# Patient Record
Sex: Female | Born: 1955 | Race: White | Hispanic: No | Marital: Married | Smoking: Never smoker
Health system: Southern US, Community
[De-identification: ages and names within clinical notes are randomized; demographics above are authoritative.]

## PROBLEM LIST (undated history)

## (undated) DIAGNOSIS — F419 Anxiety disorder, unspecified: Secondary | ICD-10-CM

## (undated) DIAGNOSIS — I2699 Other pulmonary embolism without acute cor pulmonale: Secondary | ICD-10-CM

## (undated) DIAGNOSIS — F430 Acute stress reaction: Secondary | ICD-10-CM

## (undated) DIAGNOSIS — E041 Nontoxic single thyroid nodule: Secondary | ICD-10-CM

## (undated) DIAGNOSIS — I951 Orthostatic hypotension: Secondary | ICD-10-CM

## (undated) DIAGNOSIS — R918 Other nonspecific abnormal finding of lung field: Secondary | ICD-10-CM

## (undated) DIAGNOSIS — I10 Essential (primary) hypertension: Secondary | ICD-10-CM

## (undated) DIAGNOSIS — Z87440 Personal history of urinary (tract) infections: Secondary | ICD-10-CM

## (undated) DIAGNOSIS — F32A Depression, unspecified: Secondary | ICD-10-CM

## (undated) DIAGNOSIS — R002 Palpitations: Secondary | ICD-10-CM

## (undated) DIAGNOSIS — R51 Headache: Secondary | ICD-10-CM

## (undated) DIAGNOSIS — I495 Sick sinus syndrome: Secondary | ICD-10-CM

## (undated) DIAGNOSIS — M1990 Unspecified osteoarthritis, unspecified site: Secondary | ICD-10-CM

## (undated) DIAGNOSIS — K224 Dyskinesia of esophagus: Secondary | ICD-10-CM

## (undated) DIAGNOSIS — Z950 Presence of cardiac pacemaker: Secondary | ICD-10-CM

## (undated) DIAGNOSIS — G4730 Sleep apnea, unspecified: Secondary | ICD-10-CM

## (undated) DIAGNOSIS — M797 Fibromyalgia: Secondary | ICD-10-CM

## (undated) DIAGNOSIS — Z8679 Personal history of other diseases of the circulatory system: Secondary | ICD-10-CM

## (undated) DIAGNOSIS — F329 Major depressive disorder, single episode, unspecified: Secondary | ICD-10-CM

## (undated) DIAGNOSIS — E669 Obesity, unspecified: Secondary | ICD-10-CM

## (undated) HISTORY — DX: Acute stress reaction: F43.0

## (undated) HISTORY — DX: Depression, unspecified: F32.A

## (undated) HISTORY — DX: Other pulmonary embolism without acute cor pulmonale: I26.99

## (undated) HISTORY — PX: SPLENECTOMY, TOTAL: SHX788

## (undated) HISTORY — DX: Essential (primary) hypertension: I10

## (undated) HISTORY — PX: GASTRIC BYPASS: SHX52

## (undated) HISTORY — PX: RIB RESECTION: SHX5077

## (undated) HISTORY — DX: Personal history of urinary (tract) infections: Z87.440

## (undated) HISTORY — PX: ABDOMINAL HYSTERECTOMY: SHX81

## (undated) HISTORY — DX: Other nonspecific abnormal finding of lung field: R91.8

## (undated) HISTORY — PX: LAMINECTOMY: SHX219

## (undated) HISTORY — DX: Sick sinus syndrome: I49.5

## (undated) HISTORY — PX: ABDOMINAL SURGERY: SHX537

## (undated) HISTORY — DX: Palpitations: R00.2

## (undated) HISTORY — PX: CHOLECYSTECTOMY: SHX55

## (undated) HISTORY — DX: Obesity, unspecified: E66.9

## (undated) HISTORY — PX: FOOT SURGERY: SHX648

## (undated) HISTORY — PX: ECTOPIC PREGNANCY SURGERY: SHX613

## (undated) HISTORY — DX: Anxiety disorder, unspecified: F41.9

## (undated) HISTORY — PX: APPENDECTOMY: SHX54

## (undated) HISTORY — PX: PACEMAKER PLACEMENT: SHX43

## (undated) HISTORY — DX: Major depressive disorder, single episode, unspecified: F32.9

## (undated) HISTORY — PX: STOMACH SURGERY: SHX791

## (undated) HISTORY — DX: Orthostatic hypotension: I95.1

## (undated) HISTORY — PX: KNEE SURGERY: SHX244

## (undated) HISTORY — DX: Personal history of other diseases of the circulatory system: Z86.79

---

## 1998-08-07 ENCOUNTER — Other Ambulatory Visit: Admission: RE | Admit: 1998-08-07 | Discharge: 1998-08-07 | Payer: Self-pay | Admitting: *Deleted

## 1998-08-23 ENCOUNTER — Other Ambulatory Visit: Admission: RE | Admit: 1998-08-23 | Discharge: 1998-08-23 | Payer: Self-pay | Admitting: *Deleted

## 1999-03-28 ENCOUNTER — Encounter: Payer: Self-pay | Admitting: Family Medicine

## 1999-03-28 LAB — CONVERTED CEMR LAB

## 2002-03-23 ENCOUNTER — Inpatient Hospital Stay (HOSPITAL_COMMUNITY): Admission: EM | Admit: 2002-03-23 | Discharge: 2002-03-28 | Payer: Self-pay | Admitting: Emergency Medicine

## 2002-03-23 ENCOUNTER — Encounter: Payer: Self-pay | Admitting: Neurosurgery

## 2002-03-23 ENCOUNTER — Encounter: Payer: Self-pay | Admitting: Family Medicine

## 2002-03-24 ENCOUNTER — Encounter: Payer: Self-pay | Admitting: Neurosurgery

## 2002-04-08 ENCOUNTER — Encounter: Admission: RE | Admit: 2002-04-08 | Discharge: 2002-04-08 | Payer: Self-pay | Admitting: Neurosurgery

## 2002-04-08 ENCOUNTER — Encounter: Payer: Self-pay | Admitting: Neurosurgery

## 2002-05-03 ENCOUNTER — Encounter: Admission: RE | Admit: 2002-05-03 | Discharge: 2002-05-03 | Payer: Self-pay | Admitting: Neurosurgery

## 2002-05-03 ENCOUNTER — Encounter: Payer: Self-pay | Admitting: Neurosurgery

## 2002-06-09 ENCOUNTER — Encounter: Payer: Self-pay | Admitting: Neurosurgery

## 2002-06-09 ENCOUNTER — Encounter: Admission: RE | Admit: 2002-06-09 | Discharge: 2002-06-09 | Payer: Self-pay | Admitting: Neurosurgery

## 2002-06-16 ENCOUNTER — Encounter: Payer: Self-pay | Admitting: Neurosurgery

## 2002-06-17 ENCOUNTER — Inpatient Hospital Stay (HOSPITAL_COMMUNITY): Admission: RE | Admit: 2002-06-17 | Discharge: 2002-06-22 | Payer: Self-pay | Admitting: Neurosurgery

## 2002-06-17 ENCOUNTER — Encounter: Payer: Self-pay | Admitting: Neurosurgery

## 2002-06-20 ENCOUNTER — Encounter: Payer: Self-pay | Admitting: Neurosurgery

## 2002-08-02 ENCOUNTER — Encounter: Payer: Self-pay | Admitting: Neurosurgery

## 2002-08-02 ENCOUNTER — Encounter: Admission: RE | Admit: 2002-08-02 | Discharge: 2002-08-02 | Payer: Self-pay | Admitting: Neurosurgery

## 2002-08-22 ENCOUNTER — Encounter: Payer: Self-pay | Admitting: Neurosurgery

## 2002-08-22 ENCOUNTER — Encounter: Admission: RE | Admit: 2002-08-22 | Discharge: 2002-08-22 | Payer: Self-pay | Admitting: Neurosurgery

## 2002-09-09 ENCOUNTER — Ambulatory Visit (HOSPITAL_BASED_OUTPATIENT_CLINIC_OR_DEPARTMENT_OTHER): Admission: RE | Admit: 2002-09-09 | Discharge: 2002-09-09 | Payer: Self-pay | Admitting: Urology

## 2002-09-09 ENCOUNTER — Encounter (INDEPENDENT_AMBULATORY_CARE_PROVIDER_SITE_OTHER): Payer: Self-pay | Admitting: Specialist

## 2002-09-10 ENCOUNTER — Emergency Department (HOSPITAL_COMMUNITY): Admission: EM | Admit: 2002-09-10 | Discharge: 2002-09-10 | Payer: Self-pay | Admitting: Emergency Medicine

## 2002-11-01 ENCOUNTER — Encounter: Payer: Self-pay | Admitting: Neurosurgery

## 2002-11-01 ENCOUNTER — Encounter: Admission: RE | Admit: 2002-11-01 | Discharge: 2002-11-01 | Payer: Self-pay | Admitting: Neurosurgery

## 2002-11-30 ENCOUNTER — Encounter: Payer: Self-pay | Admitting: Neurosurgery

## 2002-11-30 ENCOUNTER — Encounter: Admission: RE | Admit: 2002-11-30 | Discharge: 2002-11-30 | Payer: Self-pay | Admitting: Neurosurgery

## 2003-02-02 ENCOUNTER — Encounter: Admission: RE | Admit: 2003-02-02 | Discharge: 2003-02-02 | Payer: Self-pay | Admitting: Neurosurgery

## 2003-02-02 ENCOUNTER — Encounter: Payer: Self-pay | Admitting: Neurosurgery

## 2003-07-17 ENCOUNTER — Emergency Department (HOSPITAL_COMMUNITY): Admission: EM | Admit: 2003-07-17 | Discharge: 2003-07-18 | Payer: Self-pay | Admitting: Emergency Medicine

## 2003-07-18 ENCOUNTER — Encounter: Payer: Self-pay | Admitting: Emergency Medicine

## 2003-07-18 ENCOUNTER — Ambulatory Visit (HOSPITAL_COMMUNITY): Admission: RE | Admit: 2003-07-18 | Discharge: 2003-07-18 | Payer: Self-pay | Admitting: Emergency Medicine

## 2003-09-15 ENCOUNTER — Encounter (INDEPENDENT_AMBULATORY_CARE_PROVIDER_SITE_OTHER): Payer: Self-pay | Admitting: *Deleted

## 2003-09-15 ENCOUNTER — Ambulatory Visit (HOSPITAL_COMMUNITY): Admission: RE | Admit: 2003-09-15 | Discharge: 2003-09-15 | Payer: Self-pay | Admitting: Internal Medicine

## 2003-09-19 ENCOUNTER — Ambulatory Visit (HOSPITAL_COMMUNITY): Admission: RE | Admit: 2003-09-19 | Discharge: 2003-09-19 | Payer: Self-pay | Admitting: Internal Medicine

## 2003-10-27 ENCOUNTER — Emergency Department (HOSPITAL_COMMUNITY): Admission: EM | Admit: 2003-10-27 | Discharge: 2003-10-27 | Payer: Self-pay | Admitting: Emergency Medicine

## 2004-05-02 ENCOUNTER — Encounter: Admission: RE | Admit: 2004-05-02 | Discharge: 2004-05-02 | Payer: Self-pay | Admitting: Neurosurgery

## 2004-05-07 ENCOUNTER — Encounter: Admission: RE | Admit: 2004-05-07 | Discharge: 2004-05-07 | Payer: Self-pay | Admitting: Neurosurgery

## 2004-10-04 ENCOUNTER — Ambulatory Visit: Payer: Self-pay | Admitting: Family Medicine

## 2004-10-04 ENCOUNTER — Ambulatory Visit: Payer: Self-pay

## 2004-11-01 ENCOUNTER — Ambulatory Visit: Payer: Self-pay

## 2004-11-11 ENCOUNTER — Ambulatory Visit: Payer: Self-pay | Admitting: Family Medicine

## 2004-11-26 ENCOUNTER — Ambulatory Visit: Payer: Self-pay

## 2004-12-13 ENCOUNTER — Ambulatory Visit: Payer: Self-pay | Admitting: Family Medicine

## 2004-12-24 ENCOUNTER — Ambulatory Visit: Payer: Self-pay | Admitting: Family Medicine

## 2005-01-06 ENCOUNTER — Ambulatory Visit: Payer: Self-pay | Admitting: Internal Medicine

## 2005-01-09 ENCOUNTER — Ambulatory Visit: Payer: Self-pay

## 2005-01-13 ENCOUNTER — Ambulatory Visit: Payer: Self-pay

## 2005-01-17 ENCOUNTER — Ambulatory Visit (HOSPITAL_COMMUNITY): Admission: RE | Admit: 2005-01-17 | Discharge: 2005-01-17 | Payer: Self-pay | Admitting: Family Medicine

## 2005-01-24 ENCOUNTER — Ambulatory Visit: Payer: Self-pay | Admitting: Family Medicine

## 2005-02-04 ENCOUNTER — Ambulatory Visit: Payer: Self-pay | Admitting: Internal Medicine

## 2005-02-26 ENCOUNTER — Ambulatory Visit: Payer: Self-pay | Admitting: Family Medicine

## 2005-03-10 ENCOUNTER — Ambulatory Visit: Payer: Self-pay | Admitting: Internal Medicine

## 2005-04-11 ENCOUNTER — Ambulatory Visit: Payer: Self-pay | Admitting: Internal Medicine

## 2005-04-24 ENCOUNTER — Ambulatory Visit: Payer: Self-pay | Admitting: Family Medicine

## 2005-04-28 ENCOUNTER — Ambulatory Visit: Payer: Self-pay | Admitting: Family Medicine

## 2005-05-01 ENCOUNTER — Ambulatory Visit: Payer: Self-pay | Admitting: Family Medicine

## 2005-05-14 ENCOUNTER — Ambulatory Visit: Payer: Self-pay | Admitting: Internal Medicine

## 2005-06-18 ENCOUNTER — Ambulatory Visit: Payer: Self-pay | Admitting: Internal Medicine

## 2005-07-04 ENCOUNTER — Ambulatory Visit (HOSPITAL_COMMUNITY): Admission: RE | Admit: 2005-07-04 | Discharge: 2005-07-04 | Payer: Self-pay | Admitting: Orthopaedic Surgery

## 2005-07-15 ENCOUNTER — Ambulatory Visit (HOSPITAL_COMMUNITY): Admission: RE | Admit: 2005-07-15 | Discharge: 2005-07-15 | Payer: Self-pay | Admitting: Orthopaedic Surgery

## 2005-07-23 ENCOUNTER — Encounter: Admission: RE | Admit: 2005-07-23 | Discharge: 2005-08-15 | Payer: Self-pay | Admitting: Orthopaedic Surgery

## 2005-07-28 ENCOUNTER — Ambulatory Visit: Payer: Self-pay | Admitting: Internal Medicine

## 2005-08-01 ENCOUNTER — Ambulatory Visit: Payer: Self-pay | Admitting: Family Medicine

## 2005-08-21 ENCOUNTER — Ambulatory Visit: Payer: Self-pay | Admitting: Family Medicine

## 2005-08-22 ENCOUNTER — Emergency Department (HOSPITAL_COMMUNITY): Admission: EM | Admit: 2005-08-22 | Discharge: 2005-08-22 | Payer: Self-pay | Admitting: Emergency Medicine

## 2005-08-25 ENCOUNTER — Encounter: Payer: Self-pay | Admitting: Pulmonary Disease

## 2005-08-25 ENCOUNTER — Ambulatory Visit (HOSPITAL_BASED_OUTPATIENT_CLINIC_OR_DEPARTMENT_OTHER): Admission: RE | Admit: 2005-08-25 | Discharge: 2005-08-25 | Payer: Self-pay | Admitting: Family Medicine

## 2005-08-27 ENCOUNTER — Ambulatory Visit: Payer: Self-pay | Admitting: Internal Medicine

## 2005-08-28 ENCOUNTER — Ambulatory Visit: Payer: Self-pay | Admitting: Pulmonary Disease

## 2005-09-24 ENCOUNTER — Ambulatory Visit: Payer: Self-pay | Admitting: Family Medicine

## 2005-09-26 ENCOUNTER — Ambulatory Visit: Payer: Self-pay | Admitting: Internal Medicine

## 2005-10-13 ENCOUNTER — Ambulatory Visit: Payer: Self-pay | Admitting: Family Medicine

## 2005-10-31 ENCOUNTER — Ambulatory Visit: Payer: Self-pay | Admitting: Internal Medicine

## 2005-12-05 ENCOUNTER — Ambulatory Visit: Payer: Self-pay | Admitting: Internal Medicine

## 2005-12-10 ENCOUNTER — Ambulatory Visit: Payer: Self-pay | Admitting: Family Medicine

## 2005-12-13 ENCOUNTER — Ambulatory Visit: Payer: Self-pay | Admitting: Family Medicine

## 2006-01-14 ENCOUNTER — Inpatient Hospital Stay (HOSPITAL_COMMUNITY): Admission: EM | Admit: 2006-01-14 | Discharge: 2006-01-16 | Payer: Self-pay | Admitting: Emergency Medicine

## 2006-01-14 ENCOUNTER — Ambulatory Visit: Payer: Self-pay | Admitting: Cardiology

## 2006-01-21 ENCOUNTER — Ambulatory Visit: Payer: Self-pay | Admitting: Family Medicine

## 2006-01-22 ENCOUNTER — Ambulatory Visit: Payer: Self-pay

## 2006-02-20 ENCOUNTER — Ambulatory Visit: Payer: Self-pay | Admitting: Internal Medicine

## 2006-03-17 ENCOUNTER — Ambulatory Visit: Payer: Self-pay | Admitting: Family Medicine

## 2006-04-03 ENCOUNTER — Ambulatory Visit: Payer: Self-pay | Admitting: Internal Medicine

## 2006-04-21 ENCOUNTER — Ambulatory Visit: Payer: Self-pay | Admitting: Family Medicine

## 2006-04-26 ENCOUNTER — Emergency Department (HOSPITAL_COMMUNITY): Admission: EM | Admit: 2006-04-26 | Discharge: 2006-04-26 | Payer: Self-pay | Admitting: Emergency Medicine

## 2006-04-27 ENCOUNTER — Ambulatory Visit (HOSPITAL_COMMUNITY): Admission: RE | Admit: 2006-04-27 | Discharge: 2006-04-27 | Payer: Self-pay | Admitting: Emergency Medicine

## 2006-05-03 ENCOUNTER — Emergency Department (HOSPITAL_COMMUNITY): Admission: EM | Admit: 2006-05-03 | Discharge: 2006-05-03 | Payer: Self-pay | Admitting: *Deleted

## 2006-05-08 ENCOUNTER — Ambulatory Visit: Payer: Self-pay | Admitting: Internal Medicine

## 2006-05-15 ENCOUNTER — Emergency Department (HOSPITAL_COMMUNITY): Admission: EM | Admit: 2006-05-15 | Discharge: 2006-05-15 | Payer: Self-pay | Admitting: Emergency Medicine

## 2006-05-18 ENCOUNTER — Ambulatory Visit: Payer: Self-pay | Admitting: Family Medicine

## 2006-05-19 ENCOUNTER — Ambulatory Visit: Payer: Self-pay | Admitting: Family Medicine

## 2006-06-09 ENCOUNTER — Ambulatory Visit: Payer: Self-pay | Admitting: Internal Medicine

## 2006-06-19 ENCOUNTER — Ambulatory Visit: Payer: Self-pay | Admitting: Internal Medicine

## 2006-07-10 ENCOUNTER — Encounter (INDEPENDENT_AMBULATORY_CARE_PROVIDER_SITE_OTHER): Payer: Self-pay | Admitting: Specialist

## 2006-07-10 ENCOUNTER — Ambulatory Visit: Payer: Self-pay | Admitting: Internal Medicine

## 2006-07-22 ENCOUNTER — Ambulatory Visit: Payer: Self-pay | Admitting: Internal Medicine

## 2006-08-06 ENCOUNTER — Encounter: Admission: RE | Admit: 2006-08-06 | Discharge: 2006-08-06 | Payer: Self-pay | Admitting: Neurosurgery

## 2006-08-19 ENCOUNTER — Ambulatory Visit: Payer: Self-pay | Admitting: Internal Medicine

## 2006-08-20 ENCOUNTER — Ambulatory Visit: Payer: Self-pay | Admitting: Family Medicine

## 2006-09-16 ENCOUNTER — Ambulatory Visit: Payer: Self-pay | Admitting: Internal Medicine

## 2006-09-24 ENCOUNTER — Ambulatory Visit: Payer: Self-pay | Admitting: Family Medicine

## 2006-10-02 ENCOUNTER — Encounter: Payer: Self-pay | Admitting: Family Medicine

## 2006-10-14 ENCOUNTER — Ambulatory Visit: Payer: Self-pay | Admitting: Internal Medicine

## 2006-11-11 ENCOUNTER — Ambulatory Visit: Payer: Self-pay | Admitting: Internal Medicine

## 2006-12-09 ENCOUNTER — Ambulatory Visit: Payer: Self-pay | Admitting: Internal Medicine

## 2006-12-25 ENCOUNTER — Emergency Department (HOSPITAL_COMMUNITY): Admission: EM | Admit: 2006-12-25 | Discharge: 2006-12-25 | Payer: Self-pay | Admitting: Emergency Medicine

## 2006-12-26 ENCOUNTER — Ambulatory Visit: Payer: Self-pay | Admitting: Vascular Surgery

## 2006-12-30 ENCOUNTER — Ambulatory Visit: Payer: Self-pay | Admitting: Family Medicine

## 2007-01-06 ENCOUNTER — Ambulatory Visit: Payer: Self-pay | Admitting: Internal Medicine

## 2007-01-11 ENCOUNTER — Emergency Department (HOSPITAL_COMMUNITY): Admission: EM | Admit: 2007-01-11 | Discharge: 2007-01-11 | Payer: Self-pay | Admitting: Emergency Medicine

## 2007-01-19 ENCOUNTER — Ambulatory Visit: Payer: Self-pay | Admitting: Family Medicine

## 2007-02-03 ENCOUNTER — Ambulatory Visit: Payer: Self-pay | Admitting: Internal Medicine

## 2007-02-05 ENCOUNTER — Ambulatory Visit: Payer: Self-pay | Admitting: Internal Medicine

## 2007-02-28 ENCOUNTER — Emergency Department (HOSPITAL_COMMUNITY): Admission: EM | Admit: 2007-02-28 | Discharge: 2007-02-28 | Payer: Self-pay | Admitting: Family Medicine

## 2007-03-03 ENCOUNTER — Ambulatory Visit: Payer: Self-pay | Admitting: Internal Medicine

## 2007-03-17 ENCOUNTER — Ambulatory Visit: Payer: Self-pay | Admitting: Family Medicine

## 2007-03-17 DIAGNOSIS — M25569 Pain in unspecified knee: Secondary | ICD-10-CM | POA: Insufficient documentation

## 2007-03-18 DIAGNOSIS — E7800 Pure hypercholesterolemia, unspecified: Secondary | ICD-10-CM | POA: Insufficient documentation

## 2007-03-18 DIAGNOSIS — F411 Generalized anxiety disorder: Secondary | ICD-10-CM | POA: Insufficient documentation

## 2007-03-31 ENCOUNTER — Ambulatory Visit: Payer: Self-pay | Admitting: Internal Medicine

## 2007-04-12 ENCOUNTER — Telehealth (INDEPENDENT_AMBULATORY_CARE_PROVIDER_SITE_OTHER): Payer: Self-pay | Admitting: *Deleted

## 2007-04-12 ENCOUNTER — Ambulatory Visit: Payer: Self-pay | Admitting: Family Medicine

## 2007-04-12 LAB — CONVERTED CEMR LAB
Creatinine, Ser: 0.8 mg/dL (ref 0.4–1.2)
GFR calc non Af Amer: 80 mL/min
Glucose, Bld: 95 mg/dL (ref 70–99)
Potassium: 4.4 meq/L (ref 3.5–5.1)
Sodium: 142 meq/L (ref 135–145)

## 2007-04-14 ENCOUNTER — Encounter: Payer: Self-pay | Admitting: Family Medicine

## 2007-04-14 DIAGNOSIS — Z950 Presence of cardiac pacemaker: Secondary | ICD-10-CM | POA: Insufficient documentation

## 2007-04-14 DIAGNOSIS — Z86718 Personal history of other venous thrombosis and embolism: Secondary | ICD-10-CM | POA: Insufficient documentation

## 2007-04-14 DIAGNOSIS — S96819A Strain of other specified muscles and tendons at ankle and foot level, unspecified foot, initial encounter: Secondary | ICD-10-CM

## 2007-04-14 DIAGNOSIS — I10 Essential (primary) hypertension: Secondary | ICD-10-CM | POA: Insufficient documentation

## 2007-04-14 DIAGNOSIS — S93499A Sprain of other ligament of unspecified ankle, initial encounter: Secondary | ICD-10-CM | POA: Insufficient documentation

## 2007-04-14 DIAGNOSIS — F319 Bipolar disorder, unspecified: Secondary | ICD-10-CM | POA: Insufficient documentation

## 2007-04-16 ENCOUNTER — Ambulatory Visit: Payer: Self-pay | Admitting: Family Medicine

## 2007-04-28 ENCOUNTER — Ambulatory Visit: Payer: Self-pay | Admitting: Internal Medicine

## 2007-04-29 ENCOUNTER — Ambulatory Visit: Payer: Self-pay | Admitting: Family Medicine

## 2007-04-29 LAB — CONVERTED CEMR LAB
Chloride: 101 meq/L (ref 96–112)
Creatinine, Ser: 0.6 mg/dL (ref 0.4–1.2)
Glucose, Bld: 79 mg/dL (ref 70–99)
Potassium: 3.8 meq/L (ref 3.5–5.1)
Sodium: 140 meq/L (ref 135–145)

## 2007-05-20 ENCOUNTER — Ambulatory Visit: Payer: Self-pay | Admitting: Family Medicine

## 2007-05-20 LAB — CONVERTED CEMR LAB
BUN: 10 mg/dL (ref 6–23)
CO2: 27 meq/L (ref 19–32)
Creatinine, Ser: 0.7 mg/dL (ref 0.4–1.2)
GFR calc Af Amer: 113 mL/min
Potassium: 4.3 meq/L (ref 3.5–5.1)
Sodium: 142 meq/L (ref 135–145)

## 2007-05-24 ENCOUNTER — Encounter: Payer: Self-pay | Admitting: Family Medicine

## 2007-05-24 ENCOUNTER — Ambulatory Visit: Payer: Self-pay | Admitting: Family Medicine

## 2007-06-03 ENCOUNTER — Telehealth (INDEPENDENT_AMBULATORY_CARE_PROVIDER_SITE_OTHER): Payer: Self-pay | Admitting: *Deleted

## 2007-06-07 ENCOUNTER — Telehealth: Payer: Self-pay | Admitting: Family Medicine

## 2007-06-14 ENCOUNTER — Telehealth (INDEPENDENT_AMBULATORY_CARE_PROVIDER_SITE_OTHER): Payer: Self-pay | Admitting: *Deleted

## 2007-06-18 ENCOUNTER — Telehealth: Payer: Self-pay | Admitting: Family Medicine

## 2007-06-25 ENCOUNTER — Telehealth (INDEPENDENT_AMBULATORY_CARE_PROVIDER_SITE_OTHER): Payer: Self-pay | Admitting: *Deleted

## 2007-07-08 ENCOUNTER — Ambulatory Visit: Payer: Self-pay | Admitting: Family Medicine

## 2007-07-08 ENCOUNTER — Ambulatory Visit: Payer: Self-pay | Admitting: Internal Medicine

## 2007-07-09 LAB — CONVERTED CEMR LAB
BUN: 10 mg/dL (ref 6–23)
Basophils Absolute: 0 10*3/uL (ref 0.0–0.1)
Calcium: 8.9 mg/dL (ref 8.4–10.5)
Chloride: 106 meq/L (ref 96–112)
Creatinine, Ser: 0.8 mg/dL (ref 0.4–1.2)
Eosinophils Absolute: 0 10*3/uL (ref 0.0–0.6)
GFR calc non Af Amer: 80 mL/min
HCT: 38.8 % (ref 36.0–46.0)
MCHC: 33.9 g/dL (ref 30.0–36.0)
MCV: 96.2 fL (ref 78.0–100.0)
Monocytes Relative: 13.6 % — ABNORMAL HIGH (ref 3.0–11.0)
Platelets: 400 10*3/uL (ref 150–400)
RBC: 4.03 M/uL (ref 3.87–5.11)
RDW: 13.2 % (ref 11.5–14.6)
Sodium: 141 meq/L (ref 135–145)

## 2007-07-11 ENCOUNTER — Ambulatory Visit: Payer: Self-pay | Admitting: Vascular Surgery

## 2007-07-11 ENCOUNTER — Emergency Department (HOSPITAL_COMMUNITY): Admission: EM | Admit: 2007-07-11 | Discharge: 2007-07-11 | Payer: Self-pay | Admitting: Emergency Medicine

## 2007-07-18 ENCOUNTER — Emergency Department (HOSPITAL_COMMUNITY): Admission: EM | Admit: 2007-07-18 | Discharge: 2007-07-19 | Payer: Self-pay | Admitting: Emergency Medicine

## 2007-07-20 ENCOUNTER — Emergency Department (HOSPITAL_COMMUNITY): Admission: EM | Admit: 2007-07-20 | Discharge: 2007-07-20 | Payer: Self-pay | Admitting: Emergency Medicine

## 2007-07-23 ENCOUNTER — Encounter: Payer: Self-pay | Admitting: Family Medicine

## 2007-07-23 ENCOUNTER — Emergency Department (HOSPITAL_COMMUNITY): Admission: EM | Admit: 2007-07-23 | Discharge: 2007-07-23 | Payer: Self-pay | Admitting: Emergency Medicine

## 2007-07-29 ENCOUNTER — Ambulatory Visit: Payer: Self-pay | Admitting: Internal Medicine

## 2007-08-03 ENCOUNTER — Ambulatory Visit: Payer: Self-pay | Admitting: Family Medicine

## 2007-08-16 ENCOUNTER — Ambulatory Visit: Payer: Self-pay | Admitting: Family Medicine

## 2007-08-16 LAB — CONVERTED CEMR LAB
Bilirubin Urine: NEGATIVE
Glucose, Urine, Semiquant: NEGATIVE
Nitrite: NEGATIVE
Specific Gravity, Urine: 1.02
pH: 6

## 2007-08-18 ENCOUNTER — Ambulatory Visit: Payer: Self-pay | Admitting: Internal Medicine

## 2007-08-18 ENCOUNTER — Telehealth: Payer: Self-pay | Admitting: Family Medicine

## 2007-08-19 ENCOUNTER — Telehealth: Payer: Self-pay | Admitting: Family Medicine

## 2007-08-26 ENCOUNTER — Telehealth: Payer: Self-pay | Admitting: Family Medicine

## 2007-09-09 ENCOUNTER — Encounter: Admission: RE | Admit: 2007-09-09 | Discharge: 2007-09-09 | Payer: Self-pay | Admitting: Neurosurgery

## 2007-09-15 ENCOUNTER — Ambulatory Visit: Payer: Self-pay | Admitting: Internal Medicine

## 2007-09-30 ENCOUNTER — Telehealth: Payer: Self-pay | Admitting: Family Medicine

## 2007-10-04 ENCOUNTER — Telehealth (INDEPENDENT_AMBULATORY_CARE_PROVIDER_SITE_OTHER): Payer: Self-pay | Admitting: *Deleted

## 2007-10-05 ENCOUNTER — Encounter: Payer: Self-pay | Admitting: Family Medicine

## 2007-10-13 ENCOUNTER — Ambulatory Visit: Payer: Self-pay | Admitting: Internal Medicine

## 2007-10-19 ENCOUNTER — Ambulatory Visit: Payer: Self-pay | Admitting: Family Medicine

## 2007-10-28 DIAGNOSIS — I495 Sick sinus syndrome: Secondary | ICD-10-CM

## 2007-10-28 DIAGNOSIS — Z950 Presence of cardiac pacemaker: Secondary | ICD-10-CM

## 2007-10-28 HISTORY — DX: Presence of cardiac pacemaker: Z95.0

## 2007-10-28 HISTORY — DX: Sick sinus syndrome: I49.5

## 2007-11-02 ENCOUNTER — Ambulatory Visit: Payer: Self-pay | Admitting: Family Medicine

## 2007-11-02 DIAGNOSIS — F519 Sleep disorder not due to a substance or known physiological condition, unspecified: Secondary | ICD-10-CM | POA: Insufficient documentation

## 2007-11-10 ENCOUNTER — Ambulatory Visit: Payer: Self-pay | Admitting: Internal Medicine

## 2007-11-22 ENCOUNTER — Encounter: Payer: Self-pay | Admitting: Family Medicine

## 2007-11-24 ENCOUNTER — Encounter: Payer: Self-pay | Admitting: Family Medicine

## 2007-12-07 ENCOUNTER — Ambulatory Visit: Payer: Self-pay | Admitting: Internal Medicine

## 2007-12-07 ENCOUNTER — Telehealth: Payer: Self-pay | Admitting: Family Medicine

## 2007-12-08 ENCOUNTER — Telehealth: Payer: Self-pay | Admitting: Family Medicine

## 2007-12-08 ENCOUNTER — Encounter: Payer: Self-pay | Admitting: Family Medicine

## 2007-12-08 ENCOUNTER — Observation Stay (HOSPITAL_COMMUNITY): Admission: EM | Admit: 2007-12-08 | Discharge: 2007-12-08 | Payer: Self-pay | Admitting: Emergency Medicine

## 2007-12-08 ENCOUNTER — Ambulatory Visit: Payer: Self-pay | Admitting: Cardiology

## 2007-12-09 ENCOUNTER — Telehealth: Payer: Self-pay | Admitting: Family Medicine

## 2007-12-14 ENCOUNTER — Encounter: Payer: Self-pay | Admitting: Family Medicine

## 2007-12-14 ENCOUNTER — Ambulatory Visit: Payer: Self-pay | Admitting: Internal Medicine

## 2007-12-17 ENCOUNTER — Ambulatory Visit: Payer: Self-pay | Admitting: Family Medicine

## 2007-12-20 ENCOUNTER — Encounter: Payer: Self-pay | Admitting: Family Medicine

## 2007-12-20 LAB — CONVERTED CEMR LAB
5-HIAA, 24 Hr Urine: 7 mg/(24.h) — ABNORMAL HIGH (ref <=6.0)
Epinephrine 24 Hr Urine: 2 mcg/24hr (ref <20)
Normetanephrine, 24H Ur: 283 (ref 122–676)

## 2007-12-23 ENCOUNTER — Ambulatory Visit: Payer: Self-pay | Admitting: Family Medicine

## 2007-12-26 ENCOUNTER — Emergency Department (HOSPITAL_COMMUNITY): Admission: EM | Admit: 2007-12-26 | Discharge: 2007-12-26 | Payer: Self-pay | Admitting: Emergency Medicine

## 2007-12-26 ENCOUNTER — Encounter: Payer: Self-pay | Admitting: Family Medicine

## 2007-12-31 ENCOUNTER — Ambulatory Visit: Payer: Self-pay | Admitting: Family Medicine

## 2008-01-01 ENCOUNTER — Telehealth (INDEPENDENT_AMBULATORY_CARE_PROVIDER_SITE_OTHER): Payer: Self-pay | Admitting: Family Medicine

## 2008-01-11 ENCOUNTER — Telehealth: Payer: Self-pay | Admitting: Family Medicine

## 2008-01-19 ENCOUNTER — Telehealth: Payer: Self-pay | Admitting: Family Medicine

## 2008-01-19 ENCOUNTER — Ambulatory Visit: Payer: Self-pay | Admitting: Family Medicine

## 2008-01-20 ENCOUNTER — Ambulatory Visit: Payer: Self-pay | Admitting: Internal Medicine

## 2008-01-24 ENCOUNTER — Ambulatory Visit: Payer: Self-pay | Admitting: *Deleted

## 2008-01-24 ENCOUNTER — Encounter: Payer: Self-pay | Admitting: Family Medicine

## 2008-01-24 ENCOUNTER — Emergency Department (HOSPITAL_COMMUNITY): Admission: EM | Admit: 2008-01-24 | Discharge: 2008-01-24 | Payer: Self-pay | Admitting: Emergency Medicine

## 2008-01-24 ENCOUNTER — Encounter (INDEPENDENT_AMBULATORY_CARE_PROVIDER_SITE_OTHER): Payer: Self-pay | Admitting: Emergency Medicine

## 2008-01-25 ENCOUNTER — Telehealth: Payer: Self-pay | Admitting: Family Medicine

## 2008-01-28 ENCOUNTER — Encounter
Admission: RE | Admit: 2008-01-28 | Discharge: 2008-01-28 | Payer: Self-pay | Admitting: Physical Medicine & Rehabilitation

## 2008-01-28 ENCOUNTER — Encounter (INDEPENDENT_AMBULATORY_CARE_PROVIDER_SITE_OTHER): Payer: Self-pay | Admitting: *Deleted

## 2008-01-31 ENCOUNTER — Ambulatory Visit: Payer: Self-pay | Admitting: Family Medicine

## 2008-02-01 ENCOUNTER — Telehealth: Payer: Self-pay | Admitting: Family Medicine

## 2008-02-20 ENCOUNTER — Emergency Department (HOSPITAL_COMMUNITY): Admission: EM | Admit: 2008-02-20 | Discharge: 2008-02-20 | Payer: Self-pay | Admitting: Emergency Medicine

## 2008-02-21 ENCOUNTER — Telehealth: Payer: Self-pay | Admitting: Family Medicine

## 2008-03-06 ENCOUNTER — Ambulatory Visit: Payer: Self-pay | Admitting: Internal Medicine

## 2008-03-07 ENCOUNTER — Ambulatory Visit: Payer: Self-pay | Admitting: Family Medicine

## 2008-03-22 ENCOUNTER — Telehealth: Payer: Self-pay | Admitting: Family Medicine

## 2008-03-23 ENCOUNTER — Ambulatory Visit: Payer: Self-pay | Admitting: Family Medicine

## 2008-03-23 ENCOUNTER — Telehealth: Payer: Self-pay | Admitting: Family Medicine

## 2008-03-23 DIAGNOSIS — M810 Age-related osteoporosis without current pathological fracture: Secondary | ICD-10-CM | POA: Insufficient documentation

## 2008-03-27 ENCOUNTER — Ambulatory Visit: Payer: Self-pay | Admitting: Family Medicine

## 2008-03-27 LAB — CONVERTED CEMR LAB
ALT: 17 units/L (ref 0–35)
AST: 23 units/L (ref 0–37)
Albumin: 3.7 g/dL (ref 3.5–5.2)
BUN: 11 mg/dL (ref 6–23)
CO2: 28 meq/L (ref 19–32)
Calcium: 9.1 mg/dL (ref 8.4–10.5)
Chloride: 105 meq/L (ref 96–112)
Cholesterol: 200 mg/dL (ref 0–200)
Creatinine, Ser: 0.8 mg/dL (ref 0.4–1.2)
HDL: 71 mg/dL (ref >39.0)
LDL Cholesterol: 118 mg/dL — ABNORMAL HIGH (ref 0–99)
Triglycerides: 55 mg/dL (ref 0–149)

## 2008-03-28 ENCOUNTER — Telehealth: Payer: Self-pay | Admitting: Family Medicine

## 2008-03-29 LAB — CONVERTED CEMR LAB: Vit D, 1,25-Dihydroxy: 26 — ABNORMAL LOW (ref 30–89)

## 2008-04-14 ENCOUNTER — Telehealth (INDEPENDENT_AMBULATORY_CARE_PROVIDER_SITE_OTHER): Payer: Self-pay | Admitting: Internal Medicine

## 2008-04-26 ENCOUNTER — Telehealth: Payer: Self-pay | Admitting: Family Medicine

## 2008-05-04 ENCOUNTER — Ambulatory Visit: Payer: Self-pay | Admitting: Family Medicine

## 2008-05-07 ENCOUNTER — Ambulatory Visit (HOSPITAL_BASED_OUTPATIENT_CLINIC_OR_DEPARTMENT_OTHER): Admission: RE | Admit: 2008-05-07 | Discharge: 2008-05-07 | Payer: Self-pay | Admitting: Family Medicine

## 2008-05-11 ENCOUNTER — Telehealth: Payer: Self-pay | Admitting: Family Medicine

## 2008-05-11 ENCOUNTER — Encounter (INDEPENDENT_AMBULATORY_CARE_PROVIDER_SITE_OTHER): Payer: Self-pay | Admitting: *Deleted

## 2008-05-18 ENCOUNTER — Ambulatory Visit: Payer: Self-pay | Admitting: Pulmonary Disease

## 2008-05-20 ENCOUNTER — Emergency Department (HOSPITAL_COMMUNITY): Admission: EM | Admit: 2008-05-20 | Discharge: 2008-05-20 | Payer: Self-pay | Admitting: Emergency Medicine

## 2008-05-21 DIAGNOSIS — S2239XA Fracture of one rib, unspecified side, initial encounter for closed fracture: Secondary | ICD-10-CM | POA: Insufficient documentation

## 2008-05-22 ENCOUNTER — Ambulatory Visit: Payer: Self-pay | Admitting: Family Medicine

## 2008-05-31 ENCOUNTER — Telehealth: Payer: Self-pay | Admitting: Family Medicine

## 2008-06-06 ENCOUNTER — Ambulatory Visit: Payer: Self-pay | Admitting: Pulmonary Disease

## 2008-06-06 DIAGNOSIS — I2699 Other pulmonary embolism without acute cor pulmonale: Secondary | ICD-10-CM | POA: Insufficient documentation

## 2008-06-06 DIAGNOSIS — J309 Allergic rhinitis, unspecified: Secondary | ICD-10-CM | POA: Insufficient documentation

## 2008-06-14 ENCOUNTER — Ambulatory Visit: Payer: Self-pay | Admitting: Family Medicine

## 2008-06-14 DIAGNOSIS — H5310 Unspecified subjective visual disturbances: Secondary | ICD-10-CM | POA: Insufficient documentation

## 2008-06-15 ENCOUNTER — Encounter: Payer: Self-pay | Admitting: Family Medicine

## 2008-06-21 ENCOUNTER — Ambulatory Visit (HOSPITAL_COMMUNITY): Admission: RE | Admit: 2008-06-21 | Discharge: 2008-06-21 | Payer: Self-pay | Admitting: Family Medicine

## 2008-06-22 ENCOUNTER — Encounter (INDEPENDENT_AMBULATORY_CARE_PROVIDER_SITE_OTHER): Payer: Self-pay | Admitting: *Deleted

## 2008-06-30 ENCOUNTER — Telehealth: Payer: Self-pay | Admitting: Internal Medicine

## 2008-07-08 ENCOUNTER — Encounter: Payer: Self-pay | Admitting: Family Medicine

## 2008-07-10 ENCOUNTER — Ambulatory Visit: Payer: Self-pay | Admitting: Internal Medicine

## 2008-07-17 ENCOUNTER — Ambulatory Visit: Payer: Self-pay | Admitting: Family Medicine

## 2008-07-26 ENCOUNTER — Telehealth: Payer: Self-pay | Admitting: Family Medicine

## 2008-07-27 ENCOUNTER — Telehealth: Payer: Self-pay | Admitting: Family Medicine

## 2008-08-02 ENCOUNTER — Encounter: Payer: Self-pay | Admitting: Family Medicine

## 2008-08-06 DIAGNOSIS — G4733 Obstructive sleep apnea (adult) (pediatric): Secondary | ICD-10-CM | POA: Insufficient documentation

## 2008-08-22 ENCOUNTER — Encounter: Payer: Self-pay | Admitting: Family Medicine

## 2008-08-23 ENCOUNTER — Ambulatory Visit: Payer: Self-pay | Admitting: Family Medicine

## 2008-08-31 ENCOUNTER — Encounter: Payer: Self-pay | Admitting: Family Medicine

## 2008-09-14 ENCOUNTER — Ambulatory Visit: Payer: Self-pay | Admitting: Internal Medicine

## 2008-09-16 ENCOUNTER — Emergency Department (HOSPITAL_COMMUNITY): Admission: EM | Admit: 2008-09-16 | Discharge: 2008-09-16 | Payer: Self-pay | Admitting: Emergency Medicine

## 2008-09-18 ENCOUNTER — Ambulatory Visit: Payer: Self-pay | Admitting: Family Medicine

## 2008-09-18 DIAGNOSIS — H8309 Labyrinthitis, unspecified ear: Secondary | ICD-10-CM | POA: Insufficient documentation

## 2008-09-27 ENCOUNTER — Telehealth: Payer: Self-pay | Admitting: Family Medicine

## 2008-10-02 ENCOUNTER — Ambulatory Visit: Payer: Self-pay | Admitting: Family Medicine

## 2008-10-04 ENCOUNTER — Telehealth: Payer: Self-pay | Admitting: Family Medicine

## 2008-10-18 ENCOUNTER — Telehealth: Payer: Self-pay | Admitting: Family Medicine

## 2008-11-03 ENCOUNTER — Telehealth: Payer: Self-pay | Admitting: Family Medicine

## 2008-11-09 ENCOUNTER — Encounter: Payer: Self-pay | Admitting: Family Medicine

## 2008-11-10 ENCOUNTER — Encounter: Payer: Self-pay | Admitting: Family Medicine

## 2008-11-21 ENCOUNTER — Telehealth: Payer: Self-pay | Admitting: Family Medicine

## 2008-12-04 ENCOUNTER — Telehealth: Payer: Self-pay | Admitting: Family Medicine

## 2008-12-22 ENCOUNTER — Encounter: Payer: Self-pay | Admitting: Family Medicine

## 2009-01-01 ENCOUNTER — Telehealth: Payer: Self-pay | Admitting: Family Medicine

## 2009-01-04 ENCOUNTER — Telehealth: Payer: Self-pay | Admitting: Family Medicine

## 2009-01-08 ENCOUNTER — Ambulatory Visit: Payer: Self-pay | Admitting: Family Medicine

## 2009-01-09 ENCOUNTER — Encounter: Payer: Self-pay | Admitting: Internal Medicine

## 2009-01-11 ENCOUNTER — Telehealth: Payer: Self-pay | Admitting: Family Medicine

## 2009-01-15 ENCOUNTER — Ambulatory Visit: Payer: Self-pay | Admitting: Internal Medicine

## 2009-02-22 ENCOUNTER — Telehealth: Payer: Self-pay | Admitting: Family Medicine

## 2009-03-16 ENCOUNTER — Ambulatory Visit: Payer: Self-pay | Admitting: Internal Medicine

## 2009-03-21 ENCOUNTER — Ambulatory Visit: Payer: Self-pay | Admitting: Family Medicine

## 2009-04-30 ENCOUNTER — Encounter: Payer: Self-pay | Admitting: Internal Medicine

## 2009-05-07 ENCOUNTER — Ambulatory Visit: Payer: Self-pay | Admitting: Family Medicine

## 2009-05-09 ENCOUNTER — Telehealth: Payer: Self-pay | Admitting: Family Medicine

## 2009-05-21 ENCOUNTER — Encounter: Payer: Self-pay | Admitting: Internal Medicine

## 2009-05-22 ENCOUNTER — Ambulatory Visit: Payer: Self-pay | Admitting: Family Medicine

## 2009-05-30 ENCOUNTER — Telehealth: Payer: Self-pay | Admitting: Family Medicine

## 2009-06-01 ENCOUNTER — Encounter: Payer: Self-pay | Admitting: Internal Medicine

## 2009-06-04 ENCOUNTER — Ambulatory Visit: Payer: Self-pay | Admitting: Internal Medicine

## 2009-06-07 ENCOUNTER — Telehealth: Payer: Self-pay | Admitting: Family Medicine

## 2009-06-11 ENCOUNTER — Encounter: Payer: Self-pay | Admitting: Internal Medicine

## 2009-06-21 ENCOUNTER — Telehealth: Payer: Self-pay | Admitting: Family Medicine

## 2009-07-03 ENCOUNTER — Ambulatory Visit: Payer: Self-pay | Admitting: Family Medicine

## 2009-07-09 ENCOUNTER — Encounter: Admission: RE | Admit: 2009-07-09 | Discharge: 2009-07-09 | Payer: Self-pay | Admitting: Neurosurgery

## 2009-07-19 ENCOUNTER — Emergency Department (HOSPITAL_COMMUNITY): Admission: EM | Admit: 2009-07-19 | Discharge: 2009-07-19 | Payer: Self-pay | Admitting: Emergency Medicine

## 2009-07-20 ENCOUNTER — Telehealth (INDEPENDENT_AMBULATORY_CARE_PROVIDER_SITE_OTHER): Payer: Self-pay | Admitting: Internal Medicine

## 2009-07-21 ENCOUNTER — Emergency Department (HOSPITAL_COMMUNITY): Admission: EM | Admit: 2009-07-21 | Discharge: 2009-07-22 | Payer: Self-pay | Admitting: Emergency Medicine

## 2009-07-24 ENCOUNTER — Ambulatory Visit: Payer: Self-pay | Admitting: Family Medicine

## 2009-07-26 ENCOUNTER — Telehealth: Payer: Self-pay | Admitting: Family Medicine

## 2009-07-27 ENCOUNTER — Inpatient Hospital Stay (HOSPITAL_COMMUNITY): Admission: EM | Admit: 2009-07-27 | Discharge: 2009-07-28 | Payer: Self-pay | Admitting: Emergency Medicine

## 2009-07-27 ENCOUNTER — Telehealth (INDEPENDENT_AMBULATORY_CARE_PROVIDER_SITE_OTHER): Payer: Self-pay | Admitting: Internal Medicine

## 2009-07-28 ENCOUNTER — Encounter (INDEPENDENT_AMBULATORY_CARE_PROVIDER_SITE_OTHER): Payer: Self-pay | Admitting: General Surgery

## 2009-08-08 ENCOUNTER — Encounter: Payer: Self-pay | Admitting: Family Medicine

## 2009-08-14 ENCOUNTER — Emergency Department (HOSPITAL_COMMUNITY): Admission: EM | Admit: 2009-08-14 | Discharge: 2009-08-14 | Payer: Self-pay | Admitting: Emergency Medicine

## 2009-08-16 ENCOUNTER — Emergency Department (HOSPITAL_COMMUNITY): Admission: EM | Admit: 2009-08-16 | Discharge: 2009-08-17 | Payer: Self-pay | Admitting: Emergency Medicine

## 2009-08-17 ENCOUNTER — Telehealth: Payer: Self-pay | Admitting: Internal Medicine

## 2009-08-20 ENCOUNTER — Ambulatory Visit: Payer: Self-pay | Admitting: Family Medicine

## 2009-08-20 DIAGNOSIS — G8929 Other chronic pain: Secondary | ICD-10-CM | POA: Insufficient documentation

## 2009-08-20 DIAGNOSIS — R079 Chest pain, unspecified: Secondary | ICD-10-CM

## 2009-08-28 ENCOUNTER — Ambulatory Visit: Payer: Self-pay | Admitting: Pain Medicine

## 2009-09-06 ENCOUNTER — Telehealth: Payer: Self-pay | Admitting: Family Medicine

## 2009-09-17 ENCOUNTER — Telehealth: Payer: Self-pay | Admitting: Family Medicine

## 2009-09-17 ENCOUNTER — Encounter: Payer: Self-pay | Admitting: Internal Medicine

## 2009-09-26 ENCOUNTER — Ambulatory Visit: Payer: Self-pay | Admitting: Family Medicine

## 2009-10-08 ENCOUNTER — Telehealth: Payer: Self-pay | Admitting: Family Medicine

## 2009-10-12 ENCOUNTER — Encounter: Payer: Self-pay | Admitting: Internal Medicine

## 2009-10-15 ENCOUNTER — Ambulatory Visit: Payer: Self-pay | Admitting: Internal Medicine

## 2009-10-16 ENCOUNTER — Telehealth: Payer: Self-pay | Admitting: Family Medicine

## 2009-10-27 ENCOUNTER — Emergency Department (HOSPITAL_COMMUNITY): Admission: EM | Admit: 2009-10-27 | Discharge: 2009-10-27 | Payer: Self-pay | Admitting: Emergency Medicine

## 2009-10-29 ENCOUNTER — Ambulatory Visit: Payer: Self-pay | Admitting: Family Medicine

## 2009-10-29 ENCOUNTER — Encounter: Payer: Self-pay | Admitting: Internal Medicine

## 2009-11-08 ENCOUNTER — Telehealth: Payer: Self-pay | Admitting: Family Medicine

## 2009-11-12 ENCOUNTER — Telehealth: Payer: Self-pay | Admitting: Family Medicine

## 2009-11-15 ENCOUNTER — Telehealth: Payer: Self-pay | Admitting: Family Medicine

## 2009-11-19 ENCOUNTER — Telehealth: Payer: Self-pay | Admitting: Family Medicine

## 2009-11-19 ENCOUNTER — Ambulatory Visit: Payer: Self-pay | Admitting: Family Medicine

## 2009-11-19 ENCOUNTER — Emergency Department (HOSPITAL_COMMUNITY): Admission: EM | Admit: 2009-11-19 | Discharge: 2009-11-20 | Payer: Self-pay | Admitting: Emergency Medicine

## 2009-11-20 ENCOUNTER — Telehealth: Payer: Self-pay | Admitting: Family Medicine

## 2009-11-20 ENCOUNTER — Encounter: Payer: Self-pay | Admitting: Family Medicine

## 2009-11-23 ENCOUNTER — Emergency Department (HOSPITAL_COMMUNITY): Admission: EM | Admit: 2009-11-23 | Discharge: 2009-11-23 | Payer: Self-pay | Admitting: Emergency Medicine

## 2009-11-29 ENCOUNTER — Ambulatory Visit: Payer: Self-pay | Admitting: Family Medicine

## 2009-11-29 DIAGNOSIS — S060X1A Concussion with loss of consciousness of 30 minutes or less, initial encounter: Secondary | ICD-10-CM | POA: Insufficient documentation

## 2009-11-29 DIAGNOSIS — N390 Urinary tract infection, site not specified: Secondary | ICD-10-CM | POA: Insufficient documentation

## 2009-11-29 LAB — CONVERTED CEMR LAB
Bilirubin Urine: NEGATIVE
Blood in Urine, dipstick: NEGATIVE
Protein, U semiquant: NEGATIVE

## 2009-11-30 ENCOUNTER — Encounter: Payer: Self-pay | Admitting: Family Medicine

## 2009-12-03 ENCOUNTER — Telehealth: Payer: Self-pay | Admitting: Family Medicine

## 2009-12-13 ENCOUNTER — Ambulatory Visit: Payer: Self-pay | Admitting: Family Medicine

## 2009-12-13 DIAGNOSIS — F4321 Adjustment disorder with depressed mood: Secondary | ICD-10-CM | POA: Insufficient documentation

## 2009-12-13 DIAGNOSIS — R300 Dysuria: Secondary | ICD-10-CM | POA: Insufficient documentation

## 2009-12-13 LAB — CONVERTED CEMR LAB
Blood in Urine, dipstick: NEGATIVE
Ketones, urine, test strip: NEGATIVE
Urobilinogen, UA: 0.2

## 2009-12-17 ENCOUNTER — Telehealth: Payer: Self-pay | Admitting: Family Medicine

## 2009-12-31 ENCOUNTER — Ambulatory Visit: Payer: Self-pay | Admitting: Internal Medicine

## 2010-01-04 ENCOUNTER — Telehealth: Payer: Self-pay | Admitting: Family Medicine

## 2010-01-11 ENCOUNTER — Emergency Department (HOSPITAL_COMMUNITY): Admission: EM | Admit: 2010-01-11 | Discharge: 2010-01-11 | Payer: Self-pay | Admitting: Emergency Medicine

## 2010-01-31 ENCOUNTER — Telehealth: Payer: Self-pay | Admitting: Family Medicine

## 2010-02-01 ENCOUNTER — Encounter: Payer: Self-pay | Admitting: Family Medicine

## 2010-02-04 ENCOUNTER — Ambulatory Visit: Payer: Self-pay | Admitting: Family Medicine

## 2010-02-04 DIAGNOSIS — T148 Other injury of unspecified body region: Secondary | ICD-10-CM | POA: Insufficient documentation

## 2010-02-04 DIAGNOSIS — W57XXXA Bitten or stung by nonvenomous insect and other nonvenomous arthropods, initial encounter: Secondary | ICD-10-CM

## 2010-02-07 ENCOUNTER — Telehealth: Payer: Self-pay | Admitting: Family Medicine

## 2010-03-06 ENCOUNTER — Telehealth: Payer: Self-pay | Admitting: Family Medicine

## 2010-03-07 ENCOUNTER — Ambulatory Visit: Payer: Self-pay | Admitting: Family Medicine

## 2010-03-07 DIAGNOSIS — R5383 Other fatigue: Secondary | ICD-10-CM | POA: Insufficient documentation

## 2010-03-07 DIAGNOSIS — J069 Acute upper respiratory infection, unspecified: Secondary | ICD-10-CM | POA: Insufficient documentation

## 2010-03-07 DIAGNOSIS — R5381 Other malaise: Secondary | ICD-10-CM | POA: Insufficient documentation

## 2010-03-07 LAB — CONVERTED CEMR LAB
ALT: 12 units/L (ref 0–35)
AST: 18 units/L (ref 0–37)
Alkaline Phosphatase: 61 units/L (ref 39–117)
BUN: 14 mg/dL (ref 6–23)
Bilirubin, Direct: 0.1 mg/dL (ref 0.0–0.3)
Chloride: 105 meq/L (ref 96–112)
Potassium: 4.5 meq/L (ref 3.5–5.1)
Total Bilirubin: 0.7 mg/dL (ref 0.3–1.2)

## 2010-03-11 LAB — CONVERTED CEMR LAB
Basophils Absolute: 0 10*3/uL (ref 0.0–0.1)
Eosinophils Absolute: 0 10*3/uL (ref 0.0–0.7)
Eosinophils Relative: 1 % (ref 0–5)
HCT: 42.5 % (ref 36.0–46.0)
MCV: 96.8 fL (ref 78.0–100.0)
Platelets: 415 10*3/uL — ABNORMAL HIGH (ref 150–400)
RDW: 14 % (ref 11.5–15.5)
Vit D, 25-Hydroxy: 27 ng/mL — ABNORMAL LOW (ref 30–89)

## 2010-04-03 ENCOUNTER — Ambulatory Visit: Payer: Self-pay | Admitting: Internal Medicine

## 2010-04-04 ENCOUNTER — Telehealth: Payer: Self-pay | Admitting: Family Medicine

## 2010-04-04 ENCOUNTER — Encounter: Payer: Self-pay | Admitting: Internal Medicine

## 2010-04-18 ENCOUNTER — Ambulatory Visit: Payer: Self-pay | Admitting: Family Medicine

## 2010-04-25 ENCOUNTER — Encounter: Payer: Self-pay | Admitting: Family Medicine

## 2010-04-25 ENCOUNTER — Telehealth (INDEPENDENT_AMBULATORY_CARE_PROVIDER_SITE_OTHER): Payer: Self-pay | Admitting: *Deleted

## 2010-05-02 ENCOUNTER — Telehealth: Payer: Self-pay | Admitting: Family Medicine

## 2010-05-09 ENCOUNTER — Encounter: Payer: Self-pay | Admitting: Internal Medicine

## 2010-05-18 ENCOUNTER — Emergency Department (HOSPITAL_COMMUNITY): Admission: EM | Admit: 2010-05-18 | Discharge: 2010-05-19 | Payer: Self-pay | Admitting: Emergency Medicine

## 2010-05-29 ENCOUNTER — Encounter: Payer: Self-pay | Admitting: Family Medicine

## 2010-05-29 ENCOUNTER — Ambulatory Visit: Payer: Self-pay | Admitting: Family Medicine

## 2010-05-29 LAB — CONVERTED CEMR LAB
Blood in Urine, dipstick: NEGATIVE
Glucose, Urine, Semiquant: NEGATIVE
Nitrite: POSITIVE
Specific Gravity, Urine: >=1.03
pH: 6

## 2010-05-30 ENCOUNTER — Telehealth: Payer: Self-pay | Admitting: Family Medicine

## 2010-05-31 ENCOUNTER — Telehealth: Payer: Self-pay | Admitting: Family Medicine

## 2010-06-03 ENCOUNTER — Ambulatory Visit: Payer: Self-pay | Admitting: Family Medicine

## 2010-06-04 ENCOUNTER — Ambulatory Visit: Payer: Self-pay | Admitting: Family Medicine

## 2010-06-04 ENCOUNTER — Encounter (INDEPENDENT_AMBULATORY_CARE_PROVIDER_SITE_OTHER): Payer: Self-pay | Admitting: *Deleted

## 2010-06-05 ENCOUNTER — Ambulatory Visit: Payer: Self-pay | Admitting: Family Medicine

## 2010-06-10 ENCOUNTER — Emergency Department (HOSPITAL_COMMUNITY): Admission: EM | Admit: 2010-06-10 | Discharge: 2010-06-10 | Payer: Self-pay | Admitting: Emergency Medicine

## 2010-06-11 ENCOUNTER — Telehealth: Payer: Self-pay | Admitting: Family Medicine

## 2010-06-15 ENCOUNTER — Encounter: Payer: Self-pay | Admitting: Family Medicine

## 2010-06-17 ENCOUNTER — Encounter: Payer: Self-pay | Admitting: Family Medicine

## 2010-06-18 ENCOUNTER — Encounter: Payer: Self-pay | Admitting: Family Medicine

## 2010-06-19 ENCOUNTER — Encounter: Payer: Self-pay | Admitting: Family Medicine

## 2010-06-20 ENCOUNTER — Ambulatory Visit: Payer: Self-pay | Admitting: Family Medicine

## 2010-06-20 LAB — CONVERTED CEMR LAB
HDL: 75.8 mg/dL (ref >39.00)
Total CHOL/HDL Ratio: 2
VLDL: 15.2 mg/dL (ref 0.0–40.0)

## 2010-06-25 ENCOUNTER — Telehealth: Payer: Self-pay | Admitting: Family Medicine

## 2010-07-02 ENCOUNTER — Encounter: Admission: RE | Admit: 2010-07-02 | Discharge: 2010-07-02 | Payer: Self-pay | Admitting: Neurosurgery

## 2010-07-02 ENCOUNTER — Telehealth: Payer: Self-pay | Admitting: Family Medicine

## 2010-07-03 ENCOUNTER — Encounter: Payer: Self-pay | Admitting: Family Medicine

## 2010-07-03 ENCOUNTER — Encounter (INDEPENDENT_AMBULATORY_CARE_PROVIDER_SITE_OTHER): Payer: Self-pay | Admitting: *Deleted

## 2010-07-04 ENCOUNTER — Telehealth: Payer: Self-pay | Admitting: Family Medicine

## 2010-07-07 ENCOUNTER — Encounter: Payer: Self-pay | Admitting: Family Medicine

## 2010-07-11 ENCOUNTER — Ambulatory Visit: Payer: Self-pay | Admitting: Family Medicine

## 2010-07-11 DIAGNOSIS — R197 Diarrhea, unspecified: Secondary | ICD-10-CM | POA: Insufficient documentation

## 2010-07-11 DIAGNOSIS — J984 Other disorders of lung: Secondary | ICD-10-CM | POA: Insufficient documentation

## 2010-07-15 LAB — CONVERTED CEMR LAB
AST: 17 units/L (ref 0–37)
Alkaline Phosphatase: 77 units/L (ref 39–117)
Bilirubin, Direct: 0.1 mg/dL (ref 0.0–0.3)
CO2: 30 meq/L (ref 19–32)
Calcium: 8.6 mg/dL (ref 8.4–10.5)
GFR calc non Af Amer: 76.94 mL/min (ref >60)
Potassium: 3.7 meq/L (ref 3.5–5.1)
Sodium: 143 meq/L (ref 135–145)

## 2010-07-18 ENCOUNTER — Telehealth: Payer: Self-pay | Admitting: Family Medicine

## 2010-07-21 ENCOUNTER — Emergency Department (HOSPITAL_COMMUNITY): Admission: EM | Admit: 2010-07-21 | Discharge: 2010-07-22 | Payer: Self-pay | Admitting: Emergency Medicine

## 2010-07-22 ENCOUNTER — Ambulatory Visit: Payer: Self-pay | Admitting: Internal Medicine

## 2010-07-25 ENCOUNTER — Encounter: Payer: Self-pay | Admitting: Family Medicine

## 2010-07-26 ENCOUNTER — Telehealth: Payer: Self-pay | Admitting: Family Medicine

## 2010-07-29 ENCOUNTER — Encounter (INDEPENDENT_AMBULATORY_CARE_PROVIDER_SITE_OTHER): Payer: Self-pay | Admitting: *Deleted

## 2010-07-31 ENCOUNTER — Telehealth: Payer: Self-pay | Admitting: Family Medicine

## 2010-08-13 ENCOUNTER — Ambulatory Visit: Payer: Self-pay | Admitting: Family Medicine

## 2010-08-13 ENCOUNTER — Encounter (INDEPENDENT_AMBULATORY_CARE_PROVIDER_SITE_OTHER): Payer: Self-pay | Admitting: *Deleted

## 2010-08-13 LAB — CONVERTED CEMR LAB
Bilirubin Urine: NEGATIVE
Ketones, urine, test strip: NEGATIVE
Nitrite: NEGATIVE
Protein, U semiquant: NEGATIVE
Urobilinogen, UA: 0.2

## 2010-08-16 ENCOUNTER — Telehealth: Payer: Self-pay | Admitting: Family Medicine

## 2010-08-19 ENCOUNTER — Telehealth: Payer: Self-pay | Admitting: Family Medicine

## 2010-08-23 ENCOUNTER — Ambulatory Visit: Payer: Self-pay | Admitting: Family Medicine

## 2010-08-30 ENCOUNTER — Telehealth: Payer: Self-pay | Admitting: Family Medicine

## 2010-10-15 ENCOUNTER — Emergency Department (HOSPITAL_COMMUNITY)
Admission: EM | Admit: 2010-10-15 | Discharge: 2010-10-15 | Payer: Self-pay | Source: Home / Self Care | Admitting: Emergency Medicine

## 2010-10-24 ENCOUNTER — Ambulatory Visit: Payer: Self-pay | Admitting: Internal Medicine

## 2010-11-06 ENCOUNTER — Encounter (INDEPENDENT_AMBULATORY_CARE_PROVIDER_SITE_OTHER): Payer: Self-pay | Admitting: *Deleted

## 2010-11-17 ENCOUNTER — Encounter: Payer: Self-pay | Admitting: Neurosurgery

## 2010-11-18 ENCOUNTER — Encounter: Payer: Self-pay | Admitting: Family Medicine

## 2010-11-24 LAB — CONVERTED CEMR LAB
ALT: 17 units/L (ref 0–35)
AST: 27 units/L (ref 0–37)
Alkaline Phosphatase: 57 units/L (ref 39–117)
BUN: 14 mg/dL (ref 6–23)
Bilirubin, Direct: 0 mg/dL (ref 0.0–0.3)
Calcium: 9.2 mg/dL (ref 8.4–10.5)
Creatinine, Ser: 0.9 mg/dL (ref 0.4–1.2)
Eosinophils Relative: 0.5 % (ref 0.0–5.0)
GFR calc non Af Amer: 69.44 mL/min (ref >60)
Lymphocytes Relative: 54.5 % — ABNORMAL HIGH (ref 12.0–46.0)
Monocytes Absolute: 0.6 10*3/uL (ref 0.1–1.0)
Monocytes Relative: 8 % (ref 3.0–12.0)
Neutrophils Relative %: 36.5 % — ABNORMAL LOW (ref 43.0–77.0)
Platelets: 364 10*3/uL (ref 150.0–400.0)
Total Bilirubin: 1 mg/dL (ref 0.3–1.2)
WBC: 8 10*3/uL (ref 4.5–10.5)

## 2010-11-26 NOTE — Progress Notes (Signed)
Summary: refill requests for xanax and phenergan  Phone Note Refill Request Call back at Home Phone [phone] Call back at [phone] Message from:  Patient  Refills Requested: Medication #1:  ALPRAZOLAM 2 MG TABS one tab by mouth two times a day  Medication #2:  PROMETHAZINE HCL 25 MG TABS one tab by mouth every 6 hrs as needed for nausea. Pt states her husband has just been arrested again.  She is asking for a 2 day early refill on the xanax.  She is trying to keep her BP down.  [facility] [address]  Also she is asking for the phenergan because the bactrim is causing nausea.  Initial call taken by: Lowella Petties CMA,  February 07, 2010 4:35 PM  Follow-up for Phone Call        [facility] [address] Follow-up by: Lowella Petties CMA,  February 07, 2010 5:00 PM    New/Updated Medications: ALPRAZOLAM 2 MG TABS (ALPRAZOLAM) one tab by mouth two times a day as needed for anxiety Prescriptions: ALPRAZOLAM 2 MG TABS (ALPRAZOLAM) one tab by mouth two times a day as needed for anxiety  #60 x 0   Entered and Authorized by:   Shaune Leeks MD   Signed by:   Shaune Leeks MD on 02/07/2010   Method used:   Telephoned to ...       [facility] [phone]* (retail)       [address] [place], [place]  [address]       Ph: [phone] or [phone]       Fax: [phone]   RxID:   [id number] PROMETHAZINE HCL 25 MG TABS (PROMETHAZINE HCL) one tab by mouth every 6 hrs as needed for nausea.  #20 x 1   Entered and Authorized by:   Shaune Leeks MD   Signed by:   Shaune Leeks MD on 02/07/2010   Method used:   Electronically to        [facility] [phone]* (retail)       [address] [place], [place]  [address]       Ph: [phone] or [phone]       Fax: [phone]   RxID:   [id number]

## 2010-11-26 NOTE — Progress Notes (Signed)
Summary: Rx Promethazine  Phone Note Refill Request Call back at [phone] Message from:  [facility] on April 04, 2010 12:55 PM  Refills Requested: Medication #1:  PROMETHAZINE HCL 25 MG TABS one tab by mouth every 6 hrs as needed for nausea. Received e-scribe refill request   Method Requested: Electronic Initial call taken by: Sydell Axon LPN,  April 04, 6212 12:55 PM    Prescriptions: PROMETHAZINE HCL 25 MG TABS (PROMETHAZINE HCL) one tab by mouth every 6 hrs as needed for nausea.  #30 x 1   Entered and Authorized by:   Shaune Leeks MD   Signed by:   Shaune Leeks MD on 04/04/2010   Method used:   Electronically to        [facility] [phone]* (retail)       [address] [place], [place]  [address]       Ph: [phone] or [phone]       Fax: [phone]   RxID:   [id number]

## 2010-11-26 NOTE — Progress Notes (Signed)
Summary: pt requests phone call  Phone Note Call from Patient Call back at Home Phone [phone]   Caller: Patient Call For: Crawford Givens MD Summary of Call: Pt is asking that you call her regarding her psychiatric appt. Initial call taken by: Lowella Petties CMA, AAMA,  August 19, 2010 2:54 PM  Follow-up for Phone Call        I talked to patient about options.  She wanted follow up with another psych MD.  I will refer.  In the meantime, it would be okay to restart the trazodone.  She is off the geodon.   Follow-up by: Crawford Givens MD,  August 19, 2010 5:26 PM    Prescriptions: TRAZODONE HCL 100 MG TABS (TRAZODONE HCL) 1 TABLET AT BEDTIME  #30 x 3   Entered and Authorized by:   Crawford Givens MD   Signed by:   Crawford Givens MD on 08/19/2010   Method used:   Electronically to        [facility] [phone]* (retail)       [address] [place], [place]  [address]       Ph: [phone] or [phone]       Fax: [phone]   RxID:   [id number]

## 2010-11-26 NOTE — Letter (Signed)
Summary: Generic Letter of Concern  T[place]ias at [facility]  [address] [place], [place] [address]   Phone: [phone]  Fax: [phone]    07/03/2010  Susan Howell [address] [place], [place]  [address]  Dear Susan Howell,  We set up a psychiatric appointment for you.  It was scheduled on 06/26/10.  I have been informed that you did not show up for the appointment.    You have multiple chronic illnesses that require input from multiple clinics.  If you don't show up at scheduled appointments, then my ability to care for you will be limited.   It is now your responsibility to contact the psychiatric clinic, rechedule your appointment, and then follow through with the appointment.  If you fail to do this in the next 30 days, I will no longer be able to prescribe controlled medicines for you.    Thank you for your attention to this matter.  Please contact the clinic if you have questions.  Sincerely,   Crawford Givens MD  Appended Document: Generic Letter Sent ltr to [facility] Coaster, Medical Records to  be mailed certified. Cdavis 07-04-2010  Appended Document: Generic Letter Letter sent out by certified mail. Will update upon delivery of receipt.  Appended Document: Generic Letter of Concern Signed USPS receipt returned verifying delivery.

## 2010-11-26 NOTE — Letter (Signed)
Summary: Dr.William Blau,[facility]  Dr[facility]William Blau,[facility]   Imported By: Beau Fanny 07/11/2010 13:50:14  _____________________________________________________________________  External Attachment:    Type:   Image     Comment:   External Document

## 2010-11-26 NOTE — Progress Notes (Signed)
Summary: pt has appt with psych  Phone Note Call from Patient   Caller: Patient Call For: Shirlee Limerick, Dr. Para March Summary of Call: Pt has appt with Dr. Janeece Riggers on 9/29 at 2:15.  She was told to call with this information.                  Lowella Petties CMA  July 04, 2010 12:16 PM   Follow-up for Phone Call        noted.  Follow-up by: Crawford Givens MD,  July 04, 2010 5:53 PM

## 2010-11-26 NOTE — Miscellaneous (Signed)
  Medications Added GEODON 40 MG CAPS (ZIPRASIDONE HCL) One capsule by mouth at bedtime with food.       Clinical Lists Changes  Medications: Added new medication of GEODON 40 MG CAPS (ZIPRASIDONE HCL) One capsule by mouth at bedtime with food.

## 2010-11-26 NOTE — Progress Notes (Signed)
Summary: call a nurse   Phone Note Call from Patient   Caller: Patient Call For: Dr. Para March Summary of Call: Triage Record Num: [id number] Operator: Sula Rumple Patient Name: Susan Howell Call Date & Time: 06/10/2010 5:11:14PM Patient Phone: [phone] PCP: Patient Gender: Female PCP Fax : Patient DOB: 10/04/1956 Practice Name: [facility] Reason for Call: Pt calling on 06/10/10 states she is being treated for UTI/has been on Bactrim/was given Rocefin injection M-T-W. Pt states she has taken 57 oz of liquid in and has voided 18 oz/pt is feeling weak/fell asleep on the toilet for app 1.5 hours/urine is dark "amber" in color. Culture shows E Coli/ resistant to Bactrim/then given Rocehphin/pt advised to go to ER Protocol(s) Used: Urinary Symptoms - Female Recommended Outcome per Protocol: See Provider within 4 hours Reason for Outcome: Urinary tract symptoms AND any flank or low back pain Care Advice:  ~ 08/ Initial call taken by: Melody Comas,  June 11, 2010 10:07 AM  Follow-up for Phone Call        noted.  Agree with above.  Follow-up by: Crawford Givens MD,  June 11, 2010 10:26 AM

## 2010-11-26 NOTE — Assessment & Plan Note (Signed)
Summary: Late afternoon  - Rocephin Inj.  See lab note. Salley Scarlet   Nurse Visit   Allergies: 1)  ! Codeine 2)  ! Macrobid 3)  ! Penicillin 4)  ! Vancomycin 5)  ! * Ivp Dye 6)  ! * Lunesta 7)  ! * Eplerenone 8)  ! * Tekturna 9)  ! Labetalol Hcl (Labetalol Hcl)  Medication Administration  Injection # 1:    Medication: Rocephin  250mg     Diagnosis: UTI (ICD-599.0)    Route: IM    Site: RUOQ gluteus    Exp Date: 10/27/2012    Lot #: [id number]    Mfr: Novaplus    Comments: 1 gram given    Patient tolerated injection without complications    Given by: Linde Gillis CMA Duncan Dull) (June 03, 2010 1:33 PM)  Orders Added: 1)  Rocephin  250mg  [J0696] 2)  Admin of Therapeutic Inj  intramuscular or subcutaneous [72536]

## 2010-11-26 NOTE — Letter (Signed)
Summary: Generic Letter  [name] at [facility]  [address] [place], [place] [address]   Phone: [phone]  Fax: [phone]    07/29/2010     Re:   Susan Howell   [address]   [place], [place]  [address]   DOB:  [birth year]    TO WHOM IT MAY CONCERN:  We have recently referred [name] to your services.  Could we please get a copy of her recent note from you or an update for our records?    Sincerely,   Dwana Curd. Para March, M.D.  [facility]

## 2010-11-26 NOTE — Progress Notes (Signed)
Summary: uti   Phone Note Call from Patient Call back at Home Phone [phone]   Caller: Patient Call For: Shaune Leeks MD Summary of Call: Patient is asking if she can go ahead and get something called in for the burning sensations she is geeting when she urinates. [facility] [address] [address]  Initial call taken by: Melody Comas,  May 30, 2010 9:34 AM  Follow-up for Phone Call        done.  Tell patient that we'll contact her with the ucx results when available.  Follow-up by: Crawford Givens MD,  May 30, 2010 10:31 AM  Additional Follow-up for Phone Call Additional follow up Details #1::        Patient Advised.  Additional Follow-up by: Delilah Shan CMA (AAMA),  May 30, 2010 10:40 AM    New/Updated Medications: PYRIDIUM 200 MG TABS (PHENAZOPYRIDINE HCL) 1 by mouth three times a day for 2 days Prescriptions: PYRIDIUM 200 MG TABS (PHENAZOPYRIDINE HCL) 1 by mouth three times a day for 2 days  #6 x 0   Entered and Authorized by:   Crawford Givens MD   Signed by:   Crawford Givens MD on 05/30/2010   Method used:   Electronically to        [facility] [phone]* (retail)       [address] [place], [place]  [address]       Ph: [phone] or [phone]       Fax: [phone]   RxID:   [id number]
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
## 2010-11-26 NOTE — Progress Notes (Signed)
Summary: still has UTI  Phone Note Call from Patient Call back at Home Phone [phone]   Caller: Patient Summary of Call: Pt states she was seen at ER last night and was told that she still has UTI.  They gave her macrodantin and she is asking if ok for her to take this, after the other antibiotics that she has had. Initial call taken by: Lowella Petties CMA,  June 11, 2010 10:49 AM  Follow-up for Phone Call        She has this listed as an allergy here in the clinic.  Clarify what the allergy/intolerance was to macrobid/macrodantin previously.  If was a true allergy, let me know.  Uncomplicated GI upset is not an allergy.  Follow-up by: Crawford Givens MD,  June 11, 2010 11:05 AM  Additional Follow-up for Phone Call Additional follow up Details #1::        Patient says she has taken Macrobid in the past, in fact as a maintenance drug, and she doesn't really know how this got on her allergy list.  Delilah Shan CMA Duncan Dull)  June 11, 2010 11:23 AM     Additional Follow-up for Phone Call Additional follow up Details #2::    the  this would be okay to take.  please ammend the allergy list.  thanks.  Follow-up by: Crawford Givens MD,  June 11, 2010 11:40 AM  Additional Follow-up for Phone Call Additional follow up Details #3:: Details for Additional Follow-up Action Taken: Done Additional Follow-up by: Delilah Shan CMA Duncan Dull),  June 11, 2010 11:50 AM

## 2010-11-26 NOTE — Progress Notes (Signed)
Summary: refill request for trazadone  Phone Note Refill Request Message from:  [employer]  Refills Requested: Medication #1:  TRAZODONE HCL 100 MG TABS 1 TABLET AT BEDTIME [facility] [address] Lorenza Chick pt.  Initial call taken by: Lowella Petties CMA,  January 04, 2010 8:55 AM  Follow-up for Phone Call        refil 1 mo in dr Lorenza Chick absence-- go ahead and 1 mo with no ref I cannot put in EMR because chart is open in cardiol  Follow-up by: Judith Part MD,  January 04, 2010 9:06 AM  Additional Follow-up for Phone Call Additional follow up Details #1::        [facility] as instructed. Med called in was Trazodone HCL 100mg  take one tablet at bedtime #30 x0 refills as instructed.Lewanda Rife LPN  January 04, 2010 11:02 AM

## 2010-11-26 NOTE — Assessment & Plan Note (Signed)
Summary: Susan Howell   Nurse Visit   Allergies: 1)  ! Codeine 2)  ! Macrobid 3)  ! Penicillin 4)  ! Vancomycin 5)  ! * Ivp Dye 6)  ! * Lunesta 7)  ! * Eplerenone 8)  ! * Tekturna 9)  ! Labetalol Hcl (Labetalol Hcl)  Medication Administration  Injection # 1:    Medication: Rocephin  250mg     Diagnosis: UTI (ICD-599.0)    Route: IM    Site: LUOQ gluteus    Exp Date: 06/26/2012    Lot #: [id number]    Mfr: Sandoz    Patient tolerated injection without complications    Given by: Delilah Shan CMA Duncan Dull) (June 05, 2010 12:43 PM)  Orders Added: 1)  Admin of Therapeutic Inj  intramuscular or subcutaneous [96[address]] 2)  Rocephin  250mg  [J0696]   Medication Administration  Injection # 1:    Medication: Rocephin  250mg     Diagnosis: UTI (ICD-599.0)    Route: IM    Site: LUOQ gluteus    Exp Date: 06/26/2012    Lot #: [id number]    Mfr: Sandoz    Patient tolerated injection without complications    Given by: Delilah Shan CMA Duncan Dull) (June 05, 2010 12:43 PM)  Orders Added: 1)  Admin of Therapeutic Inj  intramuscular or subcutaneous [96372] 2)  Rocephin  250mg  [I6962]
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
## 2010-11-26 NOTE — Assessment & Plan Note (Signed)
Summary: F5M/AMD      Allergies Added:   Visit Type:  Follow-up Referring Provider:  Dr Hetty Ely Primary Provider:  Dr Hetty Ely  CC:  Susan Howell. PVC's..  History of Present Illness:    The pt is seen in followup for pacer inserted in setting of sinus node dysfunction;  She has severe hypertension.  she has a 20 min list of stories and problems    Current Medications (verified): 1)  Alprazolam 2 Mg Tabs (Alprazolam) .... One Tab By Mouth Two Times A Day As Needed For Anxiety 2)  Carisoprodol 350 Mg Tabs (Carisoprodol) .... Take One By Mouth Four Times A Day 3)  Trazodone Hcl 100 Mg Tabs (Trazodone Hcl) .Marland Kitchen.. 1 Tablet At Bedtime 4)  Nystatin 100000 Unit/ml Susp (Nystatin) .... Swish and Spit 30cc 4 Times A Day For One Week Minimum. 5)  Catapres 0.2 Mg Tabs (Clonidine Hcl) .... Take One By Mouth Three Times A Day 6)  Promethazine Hcl 25 Mg Tabs (Promethazine Hcl) .... One Tab By Mouth Every 6 Hrs As Needed For Nausea. 7)  Dilaudid 2 Mg Tabs (Hydromorphone Hcl) .... Take 1 Tablet By Mouth Every 6 Hours As Needed For Pain  Allergies (verified): 1)  ! Codeine 2)  ! Penicillin 3)  ! Vancomycin 4)  ! * Ivp Dye 5)  ! * Lunesta 6)  ! * Eplerenone 7)  ! * Tekturna 8)  ! 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 Graciela Husbands with cards for pacer Dr. Wynetta Emery for Neurosurgery- has managed pain meds Dr. Hyman Hopes with renal Dr. Logan Bores with uro Dr. Ferdinand Lango with endo  Dr. Peterson Ao with ortho at [facility] pulmonary nodules seen on CT chest 8/11, plan to recheck in 6 months  Past Surgical History: Last updated: 06/20/2010 Tonsillectomy Stomach Stapling w/ subphrenic abscess and part gastrectomy  1979 Choleycystectomy Back Surgery w/ [facility] due to fx'd vertebrae (Dr Wynetta Emery)  Nerve Entrapment Surg after Laparascopic L Heel Surgery Hysterectomy complete secondary endometriosis 06/00 ETT Graciela Husbands) no tachypalpitaions 09/00 Ganglon cystecomy left foot 09/03/00 CT of head with and without contrast wnl 06/02 Stress cardilite wnl EF 68% 09/07/01 Cystoscoopy (Evans) ICS 09/09/02 Adenosone Cardiolte negative ischEF 74% Lymph node bx. benign (Cerame) 04/07/03 EGD S/P past fundoplication with prior folds 09/15/03 Abd vasc U/S no aneurysm 01/13/05 Abd. CT winl, Pelvic CT wnl 01/17/05 Adenosone myoview wnl EF 66% 01/13/05 Sleep study mild obst. sleep apnea 81% 08/26/05 CATH wnl EF 65% 03/18/06 [facility] Chest R/O changes- GERD 03/21-03/22/07 CT Chest negative P. E. 12/25/06 Colonoscopy, divertics, ext hemorrhoids, poss polyp bx negative 07/10/06 Head CT with and without negative 07/2006 Carotid U/S wnl 09/28/06 Technetium Thyroid Uptake Scan nml 11/24/07 Replacement of implanted permanent Pacer 12/08/07 CT Head w/ w/o Nml 06/21/08 Sleep Pressure Optimization Study   10cm good control extra small Quatro full face mask(Dr Clance) 05/07/08 Appy (Dr Janee Morn) 07/27/09 CT Head No acute Abnml, Sm scalp  hematoma  CT Cerv Spine Nml  11/20/2009  Family History: Last updated: 05/29/2010 Father A, pacemaker Mother-COPD, Alzheimer's disease Brother dec Sudden Death ?MI Bipolar Brother- brain tumor Cousin-bone CA  Social History: Last updated: 05/29/2010 Patient never smoked.  Pt is married  since 1981 with 1 child.   Pt is disabled. 2 grandchildren likes photography Prev day  care worker Husband with HCV  Risk Factors: Smoking Status: never (06/06/2008)  Vital Signs:  Patient profile:   55 year old female Height:      61 inches Weight:      239 pounds BMI:     45.32 Pulse (ortho):   74 / minute BP supine:   160 / 100  (left arm) BP sitting:   164 / 120  (left arm) BP standing:    168 / 118  (left arm) Cuff size:   large  Vitals Entered By: Bishop Dublin, CMA (July 22, 2010 11:02 AM)  Physical Exam  General:  The patient was alert and oriented in no acute distress and morbidly obee HEENT Normal.  Neck veins were flat, carotids were brisk.  Lungs were clear.  Heart sounds were regular without murmurs or gallops.  Abdomen was soft with active bowel sounds. There is no clubbing cyanosis or edema. Skin Warm and dry    PPM Specifications Following MD:  Sherryl Manges, MD     PPM Vendor:  Medtronic     PPM Model Number:  [id number]     PPM Serial Number:  [id number] PPM DOI:  12/07/2007     PPM Implanting MD:  Sherryl Manges, MD  Lead 1    Location: RA     DOI: 01/09/1996     Model #: [id number]     Serial #: [id number]     Status: active Lead 2    Location: RV     DOI: 01/09/1996     Model #: [id number]     Serial #: [id number]     Status: active  Magnet Response Rate:  BOL 85 ERI  65  Indications:  Sick sinus syndrome   PPM Follow Up Pacer Dependent:  No      Episodes Coumadin:  No  Parameters Mode:  DDDR+     Lower Rate Limit:  50     Upper Rate Limit:  130 Paced AV Delay:  150     Sensed AV Delay:  120  Impression & Recommendations:  Problem # 1:  STATUS, CARDIAC PACEMAKER (ICD-V45.01) Device parameters and data were reviewed and   changes were made to rectify overzealous pacing from her rate response  Problem # 2:  SINUS NODE DYSFUNCTION (ICD-427.81) stable   Problem # 3:  HYPERTENSION ORTHOSTATIC (ICD-401.1) bp is realtively well cntrolled for her, she is to followup with the [facility] team about the next thing Her updated medication list for this problem includes:    Catapres 0.2 Mg Tabs (Clonidine hcl) .Marland Kitchen... Take one by mouth three times a day

## 2010-11-26 NOTE — Procedures (Signed)
Summary: Card[facility] Device [facility]      Allergies Added:   Current Medications (verified): 1)  Alprazolam 2 Mg Tabs (Alprazolam) .... One Tab By Mouth Two Times A Day 2)  Carisoprodol 350 Mg Tabs (Carisoprodol) .... Take One By Mouth Four Times A Day 3)  Aspirin 81 Mg  Tbec (Aspirin) .... Take One By Mouth Daily 4)  Hydrocodone-Acetaminophen 5-500 Mg  Tabs (Hydrocodone-Acetaminophen) .Marland Kitchen.. 1 or 2 Tablets As Needed For Back Pain By Mouth 5)  Trazodone Hcl 100 Mg Tabs (Trazodone Hcl) .Marland Kitchen.. 1 Tablet At Bedtime 6)  Nystatin 100000 Unit/ml Susp (Nystatin) .... Swish and Spit 30cc 4 Times A Day For One Week Minimum. 7)  Catapres 0.2 Mg Tabs (Clonidine Hcl) .... One Tab By Mouth Two Times A Day and 1/2 At Bedtime 8)  Promethazine Hcl 25 Mg Tabs (Promethazine Hcl) .... One Tab By Mouth Every 6 Hrs As Needed For Nausea.  Allergies (verified): 1)  ! Codeine 2)  ! Macrobid 3)  ! Penicillin 4)  ! Vancomycin 5)  ! * Ivp Dye 6)  ! * Lunesta 7)  ! * Eplerenone 8)  ! * Tekturna 9)  ! Labetalol Hcl (Labetalol Hcl)  PPM Specifications Following MD:  Sherryl Manges, MD     PPM Vendor:  Medtronic     PPM Model Number:  [id number]     PPM Serial Number:  [id number] PPM DOI:  12/07/2007     PPM Implanting MD:  Sherryl Manges, MD  Lead 1    Location: RA     DOI: 01/09/1996     Model #: 4524     Serial #: [id number]     Status: active Lead 2    Location: RV     DOI: 01/09/1996     Model #: [id number]     Serial #: [id number]     Status: active  Magnet Response Rate:  BOL 85 ERI  65  Indications:  Sick sinus syndrome   PPM Follow Up Remote Check?  No Battery Voltage:  2.80 V     Battery Est. Longevity:  9 years     Pacer Dependent:  No       PPM Device Measurements Atrium  Amplitude: 2.8 mV, Impedance: 702 ohms, Threshold: 1.25 V at 0.4 msec Right Ventricle  Amplitude: 11.2 mV, Impedance: 1816 ohms, Threshold: 2.0 V at 0.64 msec  Episodes MS Episodes:  2     Percent Mode Switch:  <0.1%     Coumadin:   No Ventricular High Rate:  0     Atrial Pacing:  31.9%     Ventricular Pacing:  0.7%  Parameters Mode:  DDDR+     Lower Rate Limit:  50     Upper Rate Limit:  130 Paced AV Delay:  150     Sensed AV Delay:  120 Next Remote Date:  04/03/2010     Next Cardiology Appt Due:  06/27/2010 Tech Comments:  RV reprogrammed 4.0@0 .64.  Carelink transmissions every 3 months.  ROV 6months with Dr. Graciela Husbands in [place]. Altha Harm, LPN  January 01, 7563 9:51 AM

## 2010-11-26 NOTE — Progress Notes (Signed)
  Phone Note Refill Request Message from:  [employer]line on August 16, 2010 4:49 PM  Refills Requested: Medication #1:  TRAZODONE HCL 100 MG TABS 1 TABLET AT BEDTIME Electronic request. Okay to refill?    Method Requested: Electronic Initial call taken by: Janee Morn CMA Duncan Dull),  August 16, 2010 4:49 PM  Follow-up for Phone Call        ok to refill.   Follow-up by: Eustaquio Boyden  MD,  August 16, 2010 4:53 PM  Additional Follow-up for Phone Call Additional follow up Details #1::        actually saw that last note noted change of trazodone so Selena Batten [facility] and [facility] prescription.  will route and defer to PCP.  ok to wait until then. Additional Follow-up by: Eustaquio Boyden  MD,  August 16, 2010 4:57 PM    Additional Follow-up for Phone Call Additional follow up Details #2::    She was to discuss this rx with her psych MD (Dr. Janeece Riggers).  It was prev stopped by Dr. Janeece Riggers.  I will defer to the pysch clinic.  Follow-up by: Crawford Givens MD,  August 18, 2010 11:07 PM  Additional Follow-up for Phone Call Additional follow up Details #3:: Details for Additional Follow-up Action Taken: Pharmacy advised.  Lugene Fuquay CMA Duncan Dull)  August 19, 2010 10:26 AM   Prescriptions: TRAZODONE HCL 100 MG TABS (TRAZODONE HCL) 1 TABLET AT BEDTIME  #30 x 5   Entered and Authorized by:   Eustaquio Boyden  MD   Signed by:   Eustaquio Boyden  MD on 08/16/2010   Method used:   [facility]        [facility] [phone][facility] (retail)       [address] [place], [place]  [address]       Ph: [phone] or [phone]       Fax: [phone]   RxID:   [id number]

## 2010-11-26 NOTE — Progress Notes (Signed)
  Phone Note Call from Patient   Caller: Patient Summary of Call: Spoke to the patient regarding the pain clinic referral that was started on 05/29/2010. Patient says that [facility] Dr Marguerita Merles will continue to see her if she sees a Psychiatrist and gets her Bipolar under control. We have scheduled Ms Susan Howell with Rebeca Allegra Psychologist with [facility]  her appt is 06/26/2010 at 10:00am.  Initial call taken by: Carlton Adam,  June 25, 2010 12:42 PM  Follow-up for Phone Call        Noted, app help.  Please cancel other pain clinic referral.  Thanks again.  will await psych input.  Follow-up by: Crawford Givens MD,  June 25, 2010 12:43 PM

## 2010-11-26 NOTE — Assessment & Plan Note (Signed)
Summary: ?RASH ON ABDOMEN,WEAK/CLE   Vital Signs:  Patient profile:   55 year old female Height:      61 inches Weight:      248.25 pounds BMI:     47.08 Temp:     98 degrees F oral Pulse rate:   84 / minute Pulse rhythm:   regular BP sitting:   142 / 92  (left arm) Cuff size:   large  Vitals Entered By: Delilah Shan CMA Duncan Dull) (August 23, 2010 10:56 AM) CC: ? rash on abdomen, weak.   History of Present Illness: "I felt a rash on the L side of my abdomen."  Noted on 08/20/10.  "I just felt weak that day."  Rash isn't painful.  Some nausea.  No fevers.  Minimal drainage and mild ST.  H/o fungal infections in skin folds.   Pain continues and I talked to patient about this.  See plan.   She is tangential, at baseline.  She discussed recent transportation issues, her husband, chronic pain, in addition to the above and psych referral.   Allergies: 1)  ! Codeine 2)  ! Penicillin 3)  ! Vancomycin 4)  ! * Ivp Dye 5)  ! * Lunesta 6)  ! * Eplerenone 7)  ! * Tekturna 8)  ! Labetalol Hcl (Labetalol Hcl)  Review of Systems       See HPI.  Otherwise negative.    Physical Exam  General:  NAD, sitting in chair in exam room. TMs wnl. nasal epithelium injected MMM, no thrush seen. RRR CTAB abdominal exam with mult scars noted, bilateral bands of blanching erythematous lesion in the skin folds no CVA pain   Impression & Recommendations:  Problem # 1:  URI (ICD-465.9) Supportive tx.  No indication for antibiotics.  Likely viral.  Benign exam for this.  Her updated medication list for this problem includes:    Promethazine Hcl 25 Mg Tabs (Promethazine hcl) ..... One tab by mouth every 6 hrs as needed for nausea.    Aleve 220 Mg Tabs (Naproxen sodium) .Marland Kitchen... Take 2 tablets by mouth at bedtime for pain.  Problem # 2:  RASH AND OTHER NONSPECIFIC SKIN ERUPTION, LES (ICD-782.1) LIkely fungal infection.  d/w patient re:use of nystatin.  call back as needed.  Her updated medication  list for this problem includes:    Nystatin 100000 Unit/gm Crea (Nystatin) .Marland Kitchen... Aaa 4 times a day until resolved and then for another 3 days.  Problem # 3:  DISORDER, BIPOLAR NOS (ICD-296.80) Pt to call [facility].  She has the following options: [place] [facility], reestablish with Dr. Janeece Riggers, or make her own appointment at facility of her choice.  She needs psych care for BAD in general, but also so she can get more tx for pain at [facility].  She voiced understanding of this today.  contact info for [facility] given to patient.   Complete Medication List: 1)  Alprazolam 2 Mg Tabs (Alprazolam) .... One tab by mouth two times a day as needed for anxiety 2)  Carisoprodol 350 Mg Tabs (Carisoprodol) .... Take one by mouth four times a day 3)  Trazodone Hcl 100 Mg Tabs (Trazodone hcl) .Marland Kitchen.. 1 tablet at bedtime 4)  Catapres 0.2 Mg Tabs (Clonidine hcl) .... Take one by mouth three times a day 5)  Promethazine Hcl 25 Mg Tabs (Promethazine hcl) .... One tab by mouth every 6 hrs as needed for nausea. 6)  Pyridium 200 Mg Tabs (Phenazopyridine hcl) .Marland Kitchen.. 1 by  mouth three times a day x2 days for dysuria- follow up with md if needing this med 7)  Aleve 220 Mg Tabs (Naproxen sodium) .... Take 2 tablets by mouth at bedtime for pain. 8)  Nystatin 100000 Unit/gm Crea (Nystatin) .... Aaa 4 times a day until resolved and then for another 3 days.  Patient Instructions: 1)  You will need to call the [facility] for an appointment.  [phone]. 2)  There are no other psych MDs for [place] to refer you to.  You will need to make arrangements for this otherwise.  3)  I would use the cream 4 times a day until the rash is better and then for another 3 days.  Prescriptions: NYSTATIN 100000 UNIT/GM CREA (NYSTATIN) AAA 4 times a day until resolved and then for another 3 days.  #30g x 2   Entered and Authorized by:   Crawford Givens MD   Signed by:   Crawford Givens MD on 08/23/2010   Method used:   [facility]         [facility] [phone]* (retail)       [address] [place], [place]  [address]       Ph: [phone] or [phone]       Fax: [phone]   RxID:   [id number]    Orders Added: 1)  Est. Patient Level III [75643]    Current Allergies (reviewed today): ! CODEINE ! PENICILLIN ! VANCOMYCIN ! * IVP DYE ! Alfonso Patten ! * EPLERENONE ! * TEKTURNA ! LABETALOL HCL (LABETALOL HCL)

## 2010-11-26 NOTE — Progress Notes (Signed)
Summary: refill request for alprazolam  Phone Note Refill Request Message from:  Fax from Pharmacy  Refills Requested: Medication #1:  ALPRAZOLAM 2 MG TABS one tab by mouth two times a day as needed for anxiety   Last Refilled: 04/04/2010 Faxed request from [facility] [address], [phone].  Initial call taken by: Lowella Petties CMA,  May 02, 2010 10:03 AM  Follow-up for Phone Call        Medication phoned to pharmacy.  Follow-up by: Delilah Shan CMA (AAMA),  May 03, 2010 8:30 AM    Prescriptions: ALPRAZOLAM 2 MG TABS (ALPRAZOLAM) one tab by mouth two times a day as needed for anxiety  #60 x 0   Entered and Authorized by:   Crawford Givens MD   Signed by:   Crawford Givens MD on 05/02/2010   Method used:   Telephoned to ...       [facility] [phone]* (retail)       [address] [place], [place]  [address]       Ph: [phone] or [phone]       Fax: [phone]   RxID:   [id number]

## 2010-11-26 NOTE — Progress Notes (Signed)
Summary: ? UTI  Phone Note Call from Patient Call back at [facility] Phone [phone]   Caller: Patient Summary of Call: Pt states she is having chills, back pain, nausea, weakness, burning with urination, frequency with decreased output.  I advised her that with the chills and back pain she should go to a walk in or ER to be seen.  She has been taking bactrim x 2 days- on her own, and also pyridium. Initial call taken by: Lowella Petties CMA,  November 08, 2009 4:16 PM  Follow-up for Phone Call        I agree if no appts avail - go to UC  I am out of the office this afternoon - do not think she should wait for tomorrow Follow-up by: Judith Part MD,  November 08, 2009 4:23 PM  Additional Follow-up for Phone Call Additional follow up Details #1::        There are no appts available this afternoon and I explained to the patient that, with her symptoms, she shouldn't wait till tomorrow. Additional Follow-up by: Lowella Petties CMA,  November 08, 2009 4:27 PM

## 2010-11-26 NOTE — Cardiovascular Report (Signed)
Summary: Pacemaker Follow-Up Summary  Pacemaker Follow-Up Summary   Imported By: Marylou Mccoy 08/19/2010 14:57:58  _____________________________________________________________________  External Attachment:    Type:   Image     Comment:   External Document

## 2010-11-26 NOTE — Consult Note (Signed)
Summary: Lynett Fish MD  Lynett Fish MD   Imported By: Lanelle Bal 08/20/2010 09:05:05  _____________________________________________________________________  External Attachment:    Type:   Image     Comment:   External Document

## 2010-11-26 NOTE — Assessment & Plan Note (Signed)
Summary: hoarseness/dlo   Vital Signs:  Patient profile:   55 year old female Height:      61 inches Weight:      241.25 pounds BMI:     45.75 Temp:     98.6 degrees F oral Pulse rate:   84 / minute Pulse rhythm:   regular BP sitting:   144 / 94  (left arm) Cuff size:   large  Vitals Entered By: Sydell Axon LPN (Mar 07, 2010 10:03 AM) CC: Hoarseness since Easter   History of Present Illness: Pt here for hoarseness since Easter. She had somewhat choked on a biscuit and her grandson was diagnosed with viral illness about that time at the pediatrician. She has had bad voice since that time. She also is extremely tired. She has had no fever, no headache, some dizziness which will give her nausea at times, no rhinitis, throat scratchy but doesn't hurt. She has had thrush in the past. She has had some red spots in the mouth. She is very tired. She denies depression. She has used Xanax three times a day occas when all the drama was taking place. She saw a pain mgt doc at [facility] and he wanted her to see  physical therapy which she has tried and hasn't worked. She was prescribed Nucynta (a new opioid) which hasn't helped her pain.  She has taken Clonidine lately and BP has improved.   Problems Prior to Update: 1)  Insect Bite  (ICD-919.4) 2)  Depression, Situational  (ICD-309.0) 3)  Dysuria  (ICD-788.1) 4)  Uti  (ICD-599.0) 5)  Concussion With Loc of 30 Minutes or Less  (ICD-850.11) 6)  Chest Pain, Acute  (ICD-786.50) 7)  Rash and Other Nonspecific Skin Eruption, Les  (ICD-782.1) 8)  Cellulitis and Abscess of Legs and Torso  (ICD-682.9) 9)  Crushing Chest Pain  (ICD-786.50) 10)  Morbid Obesity (ENDOCRINE W/U NEG 2010)  (ICD-278.01) 11)  Labyrinthitis, Chronic  (ICD-386.30) 12)  Obstructive Sleep Apnea  (ICD-327.23) 13)  Unspecified Subjective Visual Disturbance  (ICD-368.10) 14)  Allergic Rhinitis  (ICD-477.9) 15)  Hx of Pulmonary Embolism  (ICD-415.19) 16)  Closed Fracture of One Rib   (ICD-807.01) 17)  Osteoporosis  (ICD-733.00) 18)  Superficial Thrombophlebitis, L Calf  (ICD-451.0) 19)  Insomnia-sleep Disorder-unspec  (ICD-307.40) 20)  Pain, Chronic Nec (BACK AND KNEE)  (ICD-338.29) 21)  Pulmonary Embolism, Hx of  (ICD-V12.51) 22)  Status, Cardiac Pacemaker  (ICD-V45.01) 23)  Achilles Tendon Tear  (ICD-845.09) 24)  Fatty Liver Disease Via Abd. Ct  (ICD-571.8) 25)  Irritable Bowel Syndrome With Duodenitis  (ICD-564.1) 26)  Disorder, Bipolar Nos  (ICD-296.80) 27)  Diverticulosis, Colon Via Ct  (ICD-562.10) 28)  Anxiety Disorder, Generalized  (ICD-300.02) 29)  Hypercholesterolemia  (ICD-272.0) 30)  Hypertension, Labile  (ICD-401.9) 31)  Knee Pain, Bilateral  (ICD-719.46)  Medications Prior to Update: 1)  Alprazolam 2 Mg Tabs (Alprazolam) .... One Tab By Mouth Two Times A Day As Needed For Anxiety 2)  Carisoprodol 350 Mg Tabs (Carisoprodol) .... Take One By Mouth Four Times A Day 3)  Aspirin 81 Mg  Tbec (Aspirin) .... Take One By Mouth Daily 4)  Trazodone Hcl 100 Mg Tabs (Trazodone Hcl) .Marland Kitchen.. 1 Tablet At Bedtime 5)  Nystatin 100000 Unit/ml Susp (Nystatin) .... Swish and Spit 30cc 4 Times A Day For One Week Minimum. 6)  Catapres 0.2 Mg Tabs (Clonidine Hcl) .... One Tab By Mouth Two Times A Day and 1/2 At Bedtime 7)  Promethazine Hcl 25 Mg  Tabs (Promethazine Hcl) .... One Tab By Mouth Every 6 Hrs As Needed For Nausea. 8)  Bactrim Ds 800-160 Mg Tabs (Sulfamethoxazole-Trimethoprim) .... 2 Tabs By Mouth Two Times A Day  Allergies: 1)  ! Codeine 2)  ! Macrobid 3)  ! Penicillin 4)  ! Vancomycin 5)  ! * Ivp Dye 6)  ! * Lunesta 7)  ! * Eplerenone 8)  ! * Tekturna 9)  ! Labetalol Hcl (Labetalol Hcl)  Physical Exam  General:  Well-developed,well-nourished,in no acute distress; alert,appropriate and cooperative throughout examination. Obese. Tearful at times. Mildly hoarse but continues to tallk and most of the time normal voice until she has been talking a lot. Head:   Normocephalic and atraumatic without obvious abnormalities. No apparent alopecia or balding. Sinuses nontender. Eyes:  Conjunctiva clear bilaterally.  Ears:  External ear exam shows no significant lesions or deformities.  Otoscopic examination reveals clear canals, tympanic membranes are intact bilaterally without bulging, retraction, inflammation or discharge. Hearing is grossly normal bilaterally. Nose:  External nasal examination shows no deformity or inflammation. Nasal mucosa are pink and moist without lesions or exudates. Mildly congested. Mouth:  Oral mucosa and oropharynx without lesions or exudates.  Minimal erythema. Teeth in good repair. Neck:  No deformities, masses, or tenderness noted. Chest Wall:  No deformities, masses, or tenderness noted. Min tenderness to A/P presssure. Lungs:  Normal respiratory effort, chest expands reasonably symmetrically. Lungs are clear to auscultation, no crackles, no  wheezes heard.  Heart:  Normal rate and regular rhythm. S1 and S2 normal without gallop, murmur, click, rub or other extra sounds.   Impression & Recommendations:  Problem # 1:  URI (ICD-465.9) Assessment New  Will do labagram for the fatigue and getting CBC. Will also try Zithromax.  Will send to ENT for vocal cord eval if continues. Seems viral and really needs voice rest....that is difficult for this pt! See instructions. The following medications were removed from the medication list:    Aspirin 81 Mg Tbec (Aspirin) .Marland Kitchen... Take one by mouth daily Her updated medication list for this problem includes:    Promethazine Hcl 25 Mg Tabs (Promethazine hcl) ..... One tab by mouth every 6 hrs as needed for nausea.  Instructed on symptomatic treatment. Call if symptoms persist or worsen.   Problem # 2:  FATIGUE (ICD-780.79) Assessment: New Labagram ordered. Think she is worried, not getting rest and doing too much.  Orders: TLB-BMP (Basic Metabolic Panel-BMET) (80048-METABOL) TLB-CBC  Platelet - w/Differential (85025-CBCD) TLB-Hepatic/Liver Function Pnl (80076-HEPATIC) TLB-TSH (Thyroid Stimulating Hormone) (84443-TSH) T-Vitamin D (25-Hydroxy) ([address])  Problem # 3:  HYPERTENSION, LABILE (ICD-401.9) Assessment: Unchanged Not bad for this pt...acceptable. Cont curr medical regimen. Her updated medication list for this problem includes:    Catapres 0.2 Mg Tabs (Clonidine hcl) .Marland Kitchen... Take one by mouth three times a day  BP today: 144/94 Prior BP: 150/100 (02/04/2010)  Labs Reviewed: K+: 4.1 (03/21/2009) Creat: : 0.9 (03/21/2009)   Chol: 200 (03/27/2008)   HDL: 71.0 (03/27/2008)   LDL: 118 (03/27/2008)   TG: 55 (03/27/2008)  Problem # 4:  DEPRESSION, SITUATIONAL (ICD-309.0) Assessment: Unchanged Taking more Xanaxoccas....could have some efffect. Finances definitely have to be playing a role.  Complete Medication List: 1)  Alprazolam 2 Mg Tabs (Alprazolam) .... One tab by mouth two times a day as needed for anxiety 2)  Carisoprodol 350 Mg Tabs (Carisoprodol) .... Take one by mouth four times a day 3)  Trazodone Hcl 100 Mg Tabs (Trazodone hcl) .Marland Kitchen.. 1 tablet at  bedtime 4)  Nystatin 100000 Unit/ml Susp (Nystatin) .... Swish and spit 30cc 4 times a day for one week minimum. 5)  Catapres 0.2 Mg Tabs (Clonidine hcl) .... Take one by mouth three times a day 6)  Promethazine Hcl 25 Mg Tabs (Promethazine hcl) .... One tab by mouth every 6 hrs as needed for nausea. 7)  Nucynta 50 Mg Tabs (Tapentadol hcl) .... Take 1-2 by mouth every 6 hours as needed 8)  Zithromax Z-pak 250 Mg Tabs (Azithromycin) .... As dir  Patient Instructions: 1)  Labs today. Will pursue fatigue.  2)  Gargle every 1/2 hr with warm salt water for 2 days. 3)  Take Zithromax. 4)  Take Guaifenesin by [facility] or [facility] and getting MUCOUS RELIEF EXPECTORANT (400mg ), take 11/2 tabs by mouth AM and NOON. 5)  Drink lots of fluids anytime taking Guaifenesin.   Prescriptions: ZITHROMAX Z-PAK 250 MG TABS (AZITHROMYCIN) as dir  #1 pak x 0   Entered and Authorized by:   Shaune Leeks MD   Signed by:   Shaune Leeks MD on 03/07/2010   Method used:   [facility] to        [facility] [phone]* (retail)       [address] [place], [place]  [address]       Ph: [phone] or [phone]       Fax: [phone]   RxID:   [id number]   Current Allergies (reviewed today): ! CODEINE ! MACROBID ! PENICILLIN ! VANCOMYCIN ! * IVP DYE ! Alfonso Patten ! * EPLERENONE ! * TEKTURNA ! LABETALOL HCL (LABETALOL HCL)  Appended Document: hoarseness/dlo

## 2010-11-26 NOTE — Progress Notes (Signed)
Summary: wants to take extra xanax  Phone Note Call from Patient Call back at Home Phone [phone]   Caller: Patient Summary of Call: Pt states she is under a lot of stress with her husband's legal issues.  Her BP was 176/142, 170/120 today.  She is asking if she can take an extra half of a xanax a day if she feels she needs it.  She has an appt on 07/25/10 with psych, Dr. Janeece Riggers. Initial call taken by: Lowella Petties CMA,  July 18, 2010 4:55 PM  Follow-up for Phone Call        4 mg is the max per day on xanax.  I wouldn't go above that.  If her BP stays up, she needs follow up in the clinic.  If any CP/SOB/focal weakness in meantime, then proceed to ER.  Follow-up by: Crawford Givens MD,  July 18, 2010 5:01 PM  Additional Follow-up for Phone Call Additional follow up Details #1::        Patient Advised.  Additional Follow-up by: Delilah Shan CMA Abigayl Hor Dull),  July 18, 2010 5:25 PM

## 2010-11-26 NOTE — Progress Notes (Signed)
Summary: regarding urine culture  Phone Note Call from Patient Call back at Home Phone [phone]   Caller: Patient Call For: Dr. Para March Summary of Call: Please review pt's urine culture, she is having a lot of burning.  [facility] [address] Initial call taken by: Lowella Petties CMA,  May 31, 2010 3:11 PM  Follow-up for Phone Call        ucx isn't final.  It has Ecoli.  We don't have sensitivity yet.  Would rx septra in meantime- I sent it in.  Please inform patient.  Will adjust antibiotics as needed based on results.  Follow-up by: Crawford Givens MD,  May 31, 2010 3:16 PM  Additional Follow-up for Phone Call Additional follow up Details #1::        Patient Advised.  Additional Follow-up by: Delilah Shan CMA (AAMA),  May 31, 2010 3:26 PM    New/Updated Medications: SEPTRA DS 800-160 MG TABS (SULFAMETHOXAZOLE-TRIMETHOPRIM) 1 by mouth two times a day x3 days. Prescriptions: SEPTRA DS 800-160 MG TABS (SULFAMETHOXAZOLE-TRIMETHOPRIM) 1 by mouth two times a day x3 days.  #6 x 0   Entered and Authorized by:   Crawford Givens MD   Signed by:   Crawford Givens MD on 05/31/2010   Method used:   [facility]        [facility] [phone][facility] (retail)       [address] [place], [place]  [address]       Ph: [phone] or [phone]       Fax: [phone]   RxID:   [id number]

## 2010-11-26 NOTE — Progress Notes (Signed)
Summary: cough, congested hurts in chest  Phone Note Call from Patient Call back at [phone]   Caller: Patient Call For: Shaune Leeks MD Summary of Call: Pt called started onSat 11/17/09, productive cough with yellow mucus, sorethroat, pt chest very congested, pt hurts in chest when takes deep breath and coughs, fever is 100.2. Pt thnks she has pneumonia. Pt wheezing at night. Pt trouble getting breath also.. Pt said very achy all over,Pt took Hydrocodone for cough and achiness. Pt having severe h/a and neck pain. Pt also has sorethroat and is very weak. Pt also stumped toe last night and thinks broke rt little toe.[facility] Please advise.  Initial call taken by: Lewanda Rife LPN,  November 19, 2009 9:56 AM  Follow-up for Phone Call        Will see at 1230. Follow-up by: Shaune Leeks MD,  November 19, 2009 10:41 AM  Additional Follow-up for Phone Call Additional follow up Details #1::        Patient notified as instructed by telephone. Pt will be here today at 12:30p.m.Lewanda Rife LPN  November 19, 2009 10:47 AM

## 2010-11-26 NOTE — Progress Notes (Signed)
Summary: refill request for alprazolam  Phone Note Refill Request Message from:  Fax from Pharmacy  Refills Requested: Medication #1:  ALPRAZOLAM 2 MG TABS one tab by mouth two times a day   Last Refilled: 11/15/2009 [facility] [address] , phone [phone].  Initial call taken by: Lowella Petties CMA,  December 17, 2009 10:40 AM  Follow-up for Phone Call        [facility] Follow-up by: Lowella Petties CMA,  December 17, 2009 2:24 PM    Prescriptions: ALPRAZOLAM 2 MG TABS (ALPRAZOLAM) one tab by mouth two times a day  #60 x 1   Entered and Authorized by:   Shaune Leeks MD   Signed by:   Shaune Leeks MD on 12/17/2009   Method used:   Telephoned to ...       [facility] [phone]* (retail)       [address] [place], [place]  [address]       Ph: [phone] or [phone]       Fax: [phone]   RxID:   [id number]

## 2010-11-26 NOTE — Progress Notes (Signed)
Summary: Rx Alprazolam  Phone Note Refill Request Call back at [phone] Message [facility]:  [facility] on November 15, 2009 8:21 AM  Refills Requested: Medication #1:  ALPRAZOLAM 2 MG TABS one tab by mouth two times a day   Last Refilled: 10/16/2009 Received faxed refill request   Method Requested: Electronic Initial call taken by: Linde Gillis CMA Duncan Dull),  November 15, 2009 8:22 AM  Follow-up for Phone Call        Rx called to pharmacy Follow-up by: Sydell Axon LPN,  November 15, 2009 10:03 AM    Prescriptions: ALPRAZOLAM 2 MG TABS (ALPRAZOLAM) one tab by mouth two times a day  #60 x 0   Entered and Authorized by:   Shaune Leeks MD   Signed by:   Shaune Leeks MD on 11/15/2009   Method used:   Telephoned to ...       [facility] [phone]* (retail)       [address] [place], [place]  [address]       Ph: [phone] or [phone]       Fax: [phone]   RxID:   [id number]

## 2010-11-26 NOTE — Progress Notes (Signed)
Summary: BP reading  Phone Note Call from Patient Call back at Home Phone [phone]   Caller: Patient Call For: Shaune Leeks MD Summary of Call: Doubled Clonidine to 0.2 mg. at last OV on 10/30/09.  It is doing really well during the day, 126/70 standing.   When she lays down at night and then gets up to go to the bathroom, she feels very fatigued and has checked it before getting up and it is 60/50 and 70/56.  Is this too low and should she be concerned?  She is on ABX for UTI and has sleep apnea but of course this reading is when she wakes up but hasn't gotten up. Initial call taken by: Delilah Shan CMA Duncan Dull),  November 12, 2009 4:27 PM  Follow-up for Phone Call        If BP consistently low during the night (<100/60) have her decrease ONLY EVENING DOSE to 0.1mg . Follow-up by: Shaune Leeks MD,  November 14, 2009 7:38 AM  Additional Follow-up for Phone Call Additional follow up Details #1::        Patient notified as instructed by telephone. Was informed by patient that she has developed another UTI and was treated by Urgent Care and she is to follow-up with her kidney doctor. Additional Follow-up by: Sydell Axon LPN,  November 14, 2009 10:40 AM    Additional Follow-up for Phone Call Additional follow up Details #2::    Noted. Shaune Leeks MD  November 14, 2009 1:15 PM

## 2010-11-26 NOTE — Letter (Signed)
Summary: Nadara Eaton letter  [facility]  [address] [place], [place] [address]   Phone: [phone]  Fax: [phone]       06/04/2010 MRN: [id number]  LASHANTI CHAMBLESS [address] [place], [place]  [address]  Dear Ms. [facility], and [facility] announce the retirement of Arta Silence, M.D., from full-time practice at the [facility] office effective April 25, 2010 and his plans of returning part-time.  It is important to Dr. Hetty Ely and to our practice that you understand that [facility] has seven physicians in our office for your health care needs.  We will continue to offer the same exceptional care that you have today.    Dr. Hetty Ely has spoken to many of you about his plans for retirement and returning part-time in the fall.   We will continue to work with you through the transition to schedule appointments for you in the office and meet the high standards that [facility] is committed to.   Again, it is with great pleasure that we share the news that Dr. Hetty Ely will return to [facility] in October of 2011 with a reduced schedule.    If you have any questions, or would like to request an appointment with one of our physicians, please call us at [phone] and press the option for Scheduling an appointment.  We take pleasure in providing you with excellent patient care and look forward to seeing you at your next office visit.  Our [facility] Physicians are:  Tillman Abide, M.D. Laurita Quint, M.D. Roxy Manns, M.D. Kerby Nora, M.D. Hannah Beat, M.D. Ruthe Mannan, M.D. We proudly welcomed Raechel Ache, M.D. and Eustaquio Boyden, M.D. to the practice in July/August 2011.  Sincerely,  [facility]

## 2010-11-26 NOTE — Assessment & Plan Note (Signed)
Summary: F/U [place]/[facility]   Vital Signs:  Patient profile:   55 year old female Weight:      244 pounds Temp:     98.4 degrees F oral Pulse rate:   80 / minute Pulse rhythm:   regular BP sitting:   150 / 100  (left arm) Cuff size:   large  Vitals Entered By: Sydell Axon LPN (October 29, 2009 3:36 PM) CC: ER Follow-up from [facility]   History of Present Illness: The pt is here for followup after being seen in the ER. She went for weakness of the last week or so. In the day prior to going to the ER she noticed she was retaining fluid, having gained 20pounds since Christmas. She took a 20mg  Lasix the night prior to her ER visit and lost 11pounds overnight. She  had SOB the night before and that was relieved with the weight/fluid loss. She had no other sxs and was essentially ruled out by EKG and enzymes in the ER for MI. Her remainder of the workup was significant for nml CBC, nml BMet, nml U/A, CXR with only nonspecific chronic bronchitic changes and an elevated BNP at 178, nml up to 100. Her BP was significantly elevated but not reported. This has been an ongoing problem. She has lately been able to tolerate Catapres, currently taking 0.1 two times a day but took an extra at the ER w/o problem and took 2 0.1 tabs twice yesterday without problem and with improved BP control by her monitor in the 150-160/100 range. She has been seen multiple times by multiple specialists, Cardiologists, Nephrologists, BP specialists,  for BPcontrol, all to no avail.  She currently has no acute complaint with some residual weakness.  Problems Prior to Update: 1)  Chest Pain, Acute  (ICD-786.50) 2)  Uti  (ICD-599.0) 3)  Rash and Other Nonspecific Skin Eruption, Les  (ICD-782.1) 4)  Cellulitis and Abscess of Legs and Torso  (ICD-682.9) 5)  Thrush  (ICD-112.0) 6)  Crushing Chest Pain  (ICD-786.50) 7)  Fatigue  (ICD-780.79) 8)  Morbid Obesity (ENDOCRINE W/U NEG 2010)  (ICD-278.01) 9)  Labyrinthitis, Chronic   (ICD-386.30) 10)  Obstructive Sleep Apnea  (ICD-327.23) 11)  Unspecified Subjective Visual Disturbance  (ICD-368.10) 12)  Allergic Rhinitis  (ICD-477.9) 13)  Hx of Pulmonary Embolism  (ICD-415.19) 14)  Closed Fracture of One Rib  (ICD-807.01) 15)  Osteoporosis  (ICD-733.00) 16)  Superficial Thrombophlebitis, L Calf  (ICD-451.0) 17)  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 Ct  (ICD-571.8) 23)  Irritable Bowel Syndrome With Duodenitis  (ICD-564.1) 24)  Disorder, Bipolar Nos  (ICD-296.80) 25)  Diverticulosis, Colon Via Ct  (ICD-562.10) 26)  Anxiety Disorder, Generalized  (ICD-300.02) 27)  Hypercholesterolemia  (ICD-272.0) 28)  Hypertension, Labile  (ICD-401.9) 29)  Knee Pain, Bilateral  (ICD-719.46)  Medications Prior to Update: 1)  Alprazolam 2 Mg Tabs (Alprazolam) .... One Tab By Mouth Two Times A Day 2)  Carisoprodol 350 Mg Tabs (Carisoprodol) .... Take One By Mouth Four Times A Day 3)  Aspirin 81 Mg  Tbec (Aspirin) .... Take One By Mouth Daily 4)  Hydrocodone-Acetaminophen 5-500 Mg  Tabs (Hydrocodone-Acetaminophen) .Marland Kitchen.. 1 or 2 Tablets As Needed For Back Pain By Mouth 5)  Vitamin D 1000 U .... Take 1 Tablet  By Mouth Two Times A Day 6)  Trazodone Hcl 100 Mg Tabs (Trazodone Hcl) .Marland Kitchen.. 1 Tablet At Bedtime 7)  Nystatin 100000 Unit/ml Susp (Nystatin) .... Swish and Spit 30cc 4 Times A Day For One Week Minimum. 8)  Catapres 0.1 Mg Tabs (Clonidine Hcl) .... One Tab By Mouth Am and Pm, Two in Afternoon. 9)  Promethazine Hcl 25 Mg Tabs (Promethazine Hcl) .... One Tab By Mouth Every 6 Hrs As Needed For Nausea. 10)  Oxycodone Hcl 15 Mg Tabs (Oxycodone Hcl) .... Take One By Mouth Every 4-6 Hours As Needed 11)  Bactrim Ds 800-160 Mg Tabs (Sulfamethoxazole-Trimethoprim) .... One  Tab By Mouth Two Times A Day 12)  Pyridium 200 Mg Tabs (Phenazopyridine Hcl) .... One Tab By Mouth Three Times A Day  Allergies: 1)  ! Codeine 2)  ! Macrobid 3)  ! Penicillin 4)  ! Vancomycin 5)  ! * Ivp Dye 6)  ! * Lunesta 7)  ! * Eplerenone 8)  ! Danie Chandler  Physical Exam  General:  Well-developed,well-nourished,in no acute distress; alert,appropriate and cooperative throughout examination. Obese. Nontoxic and comfortable when seen.  Head:  Normocephalic and atraumatic without obvious abnormalities. No apparent alopecia or balding. Sinuses nontender. Eyes:  Conjunctiva clear bilaterally.  Ears:  External ear exam shows no significant lesions or deformities.  Otoscopic examination reveals clear canals, tympanic membranes are intact bilaterally without bulging, retraction, inflammation or discharge. Hearing is grossly normal bilaterally. Nose:  External nasal examination shows no deformity or inflammation. Nasal mucosa are pink and moist without lesions or exudates. Mouth:  Oral mucosa and oropharynx without lesions or exudates.  Teeth in good repair. Neck:  No deformities, masses, or tenderness noted. Chest Wall:  No deformities, masses, or tenderness noted. Lungs:  Normal respiratory effort, chest expands reasonably symmetrically. Lungs are clear to auscultation, no crackles or wheezes.  Heart:  Normal rate and regular rhythm. S1 and S2 normal without gallop, murmur, click, rub or other extra sounds. Extremities:  Minimal to no edema at present.   Impression & Recommendations:  Problem # 1:  FATIGUE (ICD-780.79) Assessment Unchanged Presumed weakness due to fluid shift from one round of Lasix. Would not continue that level of diuretic but consider poss trying addition of low dose thiazide in the future.  Problem # 2:  HYPERTENSION, LABILE (ICD-401.9) Assessment: Unchanged Still varies considerably. The challenge in the past has been finding a medication she could tolerate.  Clonidine has been tried in the past. She now seems to tolerate it so would increase slightly to 0.2 two times a day. Am not sure what her elevated BNP signifies at this point but will follow. She has had significantly elevated BP for a few years now, sometimes controlled and sometimes not. I think there is no doubt her mental state affects this. She consistently has many stressful things going on in her life in addition tio her own mental challenges. Control of these is difficult at best. Thusfar I think control of the entire situation is about as good as can be expected.  Her updated medication list for this problem includes:    Catapres 0.2 Mg Tabs (Clonidine hcl) ..... One tab by mouth three times a day  Complete Medication List: 1)  Alprazolam 2 Mg Tabs (Alprazolam) .... One tab by mouth two times a day 2)  Carisoprodol 350 Mg Tabs (Carisoprodol) .... Take one by mouth four times a day 3)  Aspirin 81 Mg Tbec (Aspirin) .... Take one by mouth daily  4)  Hydrocodone-acetaminophen 5-500 Mg Tabs (Hydrocodone-acetaminophen) .Marland Kitchen.. 1 or 2 tablets as needed for back pain by mouth 5)  Vitamin D 1000 U  .... Take 1 tablet by mouth two times a day 6)  Trazodone Hcl 100 Mg Tabs (Trazodone hcl) .Marland Kitchen.. 1 tablet at bedtime 7)  Nystatin 100000 Unit/ml Susp (Nystatin) .... Swish and spit 30cc 4 times a day for one week minimum. 8)  Catapres 0.2 Mg Tabs (Clonidine hcl) .... One tab by mouth three times a day 9)  Promethazine Hcl 25 Mg Tabs (Promethazine hcl) .... One tab by mouth every 6 hrs as needed for nausea. 10)  Oxycodone Hcl 15 Mg Tabs (Oxycodone hcl) .... Take one by mouth every 4-6 hours as needed 11)  Bactrim Ds 800-160 Mg Tabs (Sulfamethoxazole-trimethoprim) .... One tab by mouth two times a day 12)  Pyridium 200 Mg Tabs (Phenazopyridine hcl) .... One tab by mouth three times a day  Patient Instructions: 1)  RTC as needed.  2)  Cont checking BP at home and return if continually significantly  elevated. 3)  40 mins spent with pt. Prescriptions: CATAPRES 0.2 MG TABS (CLONIDINE HCL) one tab by mouth three times a day  #90 x 12   Entered and Authorized by:   Shaune Leeks MD   Signed by:   Shaune Leeks MD on 10/29/2009   Method used:   Electronically to        [facility]  [employer] [phone]* (retail)       [address] [place], [place]  [address]       Ph: [phone] or [phone]       Fax: [phone]   RxID:   [id number]   Current Allergies (reviewed today): ! CODEINE ! MACROBID ! PENICILLIN ! VANCOMYCIN ! * IVP DYE ! Alfonso Patten ! * EPLERENONE ! Danie Chandler

## 2010-11-26 NOTE — Assessment & Plan Note (Signed)
Summary: ER F/U FOR BLADDAR INFECTION & SUPERFICIAL BLOOD CLOT IN RIGH...   Vital Signs:  Patient profile:   55 year old female Height:      61 inches Weight:      246.75 pounds BMI:     46.79 Temp:     98.7 degrees F oral Pulse rate:   84 / minute Pulse rhythm:   regular BP sitting:   146 / 100  (left arm) Cuff size:   large  Vitals Entered By: Delilah Shan CMA Duncan Dull) (June 20, 2010 10:36 AM) CC: F/U with bladder infection and superficial blood clot in right knee   History of Present Illness: Seen at ER with enterococcus sens to macrobid.  Was treated.  Prev labs from [facility] reviewed.  No fevers and no dysuria.  H/o CP that was nonexertional.  Has been able to walk w/o chest pain.  Went to ER at [facility] twice.  Elevated d dimer noted and per patient eval for PE was neg.  No DVT seen but superficial thrombus noted in calf.  Requesting records.  She has h/o gastric intervention along with anxiety.  Prev cath and myoview were unremarkable.   BAD- has not seen psych recently.  Has had some periods with increase in rate of speech and sleep disruption.   No SI/HI.  Asking about advice from this point forward.    OA- has had follow up with ortho ZO:XWRU and back pain.  I signed note today to excuse patient from jury duty.   Allergies: 1)  ! Codeine 2)  ! Penicillin 3)  ! Vancomycin 4)  ! * Ivp Dye 5)  ! * Lunesta 6)  ! * Eplerenone 7)  ! * Tekturna 8)  ! 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 Graciela Husbands with cards for pacer Dr. Wynetta Emery for Neurosurgery Dr. Hyman Hopes with renal Dr. Logan Bores with uro Dr. Ferdinand Lango with endo  Dr. Peterson Ao with ortho at [facility]  Past Surgical  History: Tonsillectomy Stomach Stapling w/ subphrenic abscess and part gastrectomy 1979 Choleycystectomy Back Surgery w/ Harrington Rods due to fx'd vertebrae (Dr Wynetta Emery)  Nerve Entrapment Surg after Laparascopic L Heel Surgery Hysterectomy complete secondary endometriosis 06/00 ETT Graciela Husbands) no tachypalpitaions 09/00 Ganglon cystecomy left foot 09/03/00 CT of head with and without contrast wnl 06/02 Stress cardilite wnl EF 68% 09/07/01 Cystoscoopy (Evans) ICS 09/09/02 Adenosone Cardiolte negative ischEF 74% Lymph node bx. benign (Cerame) 04/07/03 EGD S/P past fundoplication with prior folds 09/15/03 Abd vasc U/S no aneurysm 01/13/05 Abd. CT winl, Pelvic CT wnl 01/17/05 Adenosone myoview wnl EF 66% 01/13/05 Sleep study mild obst. sleep apnea 81% 08/26/05 CATH wnl EF 65% 03/18/06 [facility] Chest R/O changes- GERD 03/21-03/22/07 CT Chest negative P. E. 12/25/06 Colonoscopy, divertics, ext hemorrhoids, poss polyp bx negative 07/10/06 Head CT with and without negative 07/2006 Carotid U/S wnl 09/28/06 Technetium Thyroid Uptake Scan nml 11/24/07 Replacement of implanted permanent Pacer 12/08/07 CT Head w/ w/o Nml 06/21/08 Sleep Pressure Optimization Study   10cm good control extra small Quatro full face mask(Dr Clance) 05/07/08 Appy (Dr Janee Morn) 07/27/09 CT Head No acute Abnml, Sm scalp  hematoma  CT Cerv Spine Nml  11/20/2009  Review of Systems       See HPI.  Otherwise negative.  Physical Exam  General:  GEN: nad, alert and oriented, obese Speech is fluent but her tangential line of conversation if difficult direct.  This is similar to prev.  HEENT: mucous membranes moist NECK: supple w/o LA CV: rrr PULM: ctab, no inc wob ABD: soft, +bs, not tender to palpation  EXT: no edema, no cords.    Impression & Recommendations:  Problem # 1:  DISORDER, BIPOLAR NOS (ICD-296.80) I would like psych help QI:ONGE.  Okay for outpatient follow up.  No SI/HI.  Orders: Psychiatric Referral  (Psych)  Problem # 2:  UTI (ICD-599.0) This is resolved and there is no reason to check ua now.  D/w patient.  The following medications were removed from the medication list:    Pyridium 200 Mg Tabs (Phenazopyridine hcl) .Marland Kitchen... 1 by mouth three times a day for 2 days    Septra Ds 800-160 Mg Tabs (Sulfamethoxazole-trimethoprim) .Marland Kitchen... 1 by mouth two times a day x3 days.  Problem # 3:  PAIN, CHRONIC NEC (BACK AND KNEE) (ICD-338.29) She is to follow up with ortho.   Problem # 4:  CHEST PAIN, ACUTE (ICD-786.50) Pt is chest pain free.  We are requesting records.  She has had extensive work up and with atypical symptoms, I would not change anything now.   >25 min spent with patient, at least half of which was spent on discussing her symptoms.   Complete Medication List: 1)  Alprazolam 2 Mg Tabs (Alprazolam) .... One tab by mouth two times a day as needed for anxiety 2)  Carisoprodol 350 Mg Tabs (Carisoprodol) .... Take one by mouth four times a day 3)  Trazodone Hcl 100 Mg Tabs (Trazodone hcl) .Marland Kitchen.. 1 tablet at bedtime 4)  Nystatin 100000 Unit/ml Susp (Nystatin) .... Swish and spit 30cc 4 times a day for one week minimum. 5)  Catapres 0.2 Mg Tabs (Clonidine hcl) .... Take one by mouth three times a day 6)  Promethazine Hcl 25 Mg Tabs (Promethazine hcl) .... One tab by mouth every 6 hrs as needed for nausea. 7)  Oxycodone-acetaminophen 5-500 Mg Caps (Oxycodone-acetaminophen) .... Take one tablet every 4 to 6 hours as needed for pain 8)  Aspirin 81 Mg Chew (Aspirin) .Marland Kitchen.. 1 by mouth qday  Patient Instructions: 1)  See Shirlee Limerick about your referral before your leave today.  I want you to follow up with psychiatry when we can arrange that. Marland Kitchen 2)  Please schedule a follow-up appointment as needed .   Current Allergies (reviewed today): ! CODEINE ! PENICILLIN ! VANCOMYCIN ! * IVP DYE ! Alfonso Patten ! * EPLERENONE ! * TEKTURNA ! LABETALOL HCL (LABETALOL HCL)

## 2010-11-26 NOTE — Progress Notes (Signed)
Summary: reporting psych visit  Phone Note Call from Patient   Caller: Patient Call For: Dr. Para March Summary of Call: Pt called to report that she did go to psych yesterday.  He took her off trazadone and put her on geodon.  She said she slept very well with that last night.  He also advised that she use clonodine patches for her BP- to keep a steady level of medicine in her system.  He doesnt need to see her back until december. Initial call taken by: Lowella Petties CMA,  July 26, 2010 9:41 AM  Follow-up for Phone Call        noted, please get the records from the psych visit.   Follow-up by: Crawford Givens MD,  July 28, 2010 10:01 PM  Additional Follow-up for Phone Call Additional follow up Details #1::        Faxed request for records. Lugene Fuquay CMA Duncan Dull)  July 29, 2010 12:36 PM   No records have been scanned into the system.  I tried to phone the Psychiatry office (see Contacts Tab) with no answer and no voicemail available.Lugene Fuquay CMA Duncan Dull)  August 02, 2010 12:57 PM     Additional Follow-up for Phone Call Additional follow up Details #2::    Spoke to Vickie at psych's office and was informed that she will fax the office notes over to Dr. Para March. Sydell Axon LPN  August 05, 2010 9:21 AM   Tried to call psych's office could never get any one on the phone to request the records. Will try to call again later. Melody Comas  August 08, 2010 4:16 PM   Additional Follow-up for Phone Call Additional follow up Details #3:: Details for Additional Follow-up Action Taken: Received office notes today. Notes are in your inbox. Melody Comas  August 12, 2010 3:42 PM  noted and read.  please update med list and then scan.  Crawford Givens MD  August 12, 2010 4:21 PM   Med list updated.  Sent for scanning.  Lugene Fuquay CMA (AAMA)  August 13, 2010 10:22 AM

## 2010-11-26 NOTE — Letter (Signed)
Summary: Remote Device Check  [facility], Main Office  [address]   [place], [place] [address]   Phone: [phone]  Fax: [phone]     October 29, 2009 MRN: [id number]   Susan Howell [address] [place], [place]  [address]   Dear [name],   Your remote transmission was recieved and reviewed by your physician.  All diagnostics were within normal limits for you.    ___X___Your next office visit is scheduled for:     MARCH 2011 WITH DR Graciela Husbands. Please call our office to schedule an appointment.    Sincerely,  Proofreader

## 2010-11-26 NOTE — Cardiovascular Report (Signed)
Summary: Office Visit Remote   Office Visit Remote   Imported By: Roderic Ovens 11/01/2009 12:29:09  _____________________________________________________________________  External Attachment:    Type:   Image     Comment:   External Document

## 2010-11-26 NOTE — Miscellaneous (Signed)
   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 Graciela Husbands with cards for pacer Dr. Wynetta Emery for Neurosurgery- has managed pain meds Dr. Hyman Hopes with renal Dr. Logan Bores with uro Dr. Ferdinand Lango with endo  Dr. 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 Graciela Husbands with cards for pacer Dr. Wynetta Emery for Neurosurgery- has managed pain meds Dr. Hyman Hopes with renal Dr. Logan Bores with uro Dr. Ferdinand Lango with endo  Dr. Peterson Ao with ortho at [facility]
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
## 2010-11-26 NOTE — Consult Note (Signed)
Summary: CBC  CBC   Imported By: Lanelle Bal 08/13/2010 11:15:59  _____________________________________________________________________  External Attachment:    Type:   Image     Comment:   External Document

## 2010-11-26 NOTE — Assessment & Plan Note (Signed)
Summary: COUGH,ST,WHEEZING PER DR Trevar Boehringer/[place]   Vital Signs:  Patient profile:   55 year old female Weight:      249 pounds O2 Sat:      96 % on Room air Temp:     100.8 degrees F oral Pulse rate:   84 / minute Pulse rhythm:   regular BP sitting:   136 / 96  (left arm) Cuff size:   large  Vitals Entered By: Sydell Axon LPN (November 19, 2009 12:30 PM)  O2 Flow:  Room air CC: Cough, sore throat, some wheezing   History of Present Illness: Pt here for congestion. She thinks she might have the flu...she started with fever and chills with feeling like she was run over by a truck. She has been using Guaifenesin and has signif headache occititally with temp to 103 which Tyl brought down to 100.1.  She was seen for resistant UTI to Bactrim, changed to Cipro and then changed to Macrodantin after culture available. She has three days left. Last week, Tues night she was drinking lots of fluids and one night she had vomiting of clear, foamy mucous like material. Then she got better quickly and went back to bed. The next night after drinking small amt of cranberry juice, she had the same reaction and then again Thu night. Then she had her sxs break out as above. She took Tyl this AM and then took Hydrocodone. She has also taken Percocet and Xanax and was abl to sleep. Now she is hurting in the rib cage.  Problems Prior to Update: 1)  Chest Pain, Acute  (ICD-786.50) 2)  Uti  (ICD-599.0) 3)  Rash and Other Nonspecific Skin Eruption, Les  (ICD-782.1) 4)  Cellulitis and Abscess of Legs and Torso  (ICD-682.9) 5)  Thrush  (ICD-112.0) 6)  Crushing Chest Pain  (ICD-786.50) 7)  Fatigue  (ICD-780.79) 8)  Morbid Obesity (ENDOCRINE W/U NEG 2010)  (ICD-278.01) 9)  Labyrinthitis, Chronic  (ICD-386.30) 10)  Obstructive Sleep Apnea  (ICD-327.23) 11)  Unspecified Subjective Visual Disturbance  (ICD-368.10) 12)  Allergic Rhinitis  (ICD-477.9) 13)  Hx of Pulmonary Embolism  (ICD-415.19) 14)  Closed Fracture of  One Rib  (ICD-807.01) 15)  Osteoporosis  (ICD-733.00) 16)  Superficial Thrombophlebitis, L Calf  (ICD-451.0) 17)  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 Ct  (ICD-571.8) 23)  Irritable Bowel Syndrome With Duodenitis  (ICD-564.1) 24)  Disorder, Bipolar Nos  (ICD-296.80) 25)  Diverticulosis, Colon Via Ct  (ICD-562.10) 26)  Anxiety Disorder, Generalized  (ICD-300.02) 27)  Hypercholesterolemia  (ICD-272.0) 28)  Hypertension, Labile  (ICD-401.9) 29)  Knee Pain, Bilateral  (ICD-719.46)  Medications Prior to Update: 1)  Alprazolam 2 Mg Tabs (Alprazolam) .... One Tab By Mouth Two Times A Day 2)  Carisoprodol 350 Mg Tabs (Carisoprodol) .... Take One By Mouth Four Times A Day 3)  Aspirin 81 Mg  Tbec (Aspirin) .... Take One By Mouth Daily 4)  Hydrocodone-Acetaminophen 5-500 Mg  Tabs (Hydrocodone-Acetaminophen) .Marland Kitchen.. 1 or 2 Tablets As Needed For Back Pain By Mouth 5)  Vitamin D 1000 U .... Take 1 Tablet By Mouth Two Times A Day 6)  Trazodone Hcl 100 Mg Tabs (Trazodone Hcl) .Marland Kitchen.. 1 Tablet At Bedtime 7)  Nystatin 100000 Unit/ml Susp (Nystatin) .... Swish and Spit 30cc 4  Times A Day For One Week Minimum. 8)  Catapres 0.2 Mg Tabs (Clonidine Hcl) .... One Tab By Mouth Three Times A Day 9)  Promethazine Hcl 25 Mg Tabs (Promethazine Hcl) .... One Tab By Mouth Every 6 Hrs As Needed For Nausea. 10)  Oxycodone Hcl 15 Mg Tabs (Oxycodone Hcl) .... Take One By Mouth Every 4-6 Hours As Needed 11)  Bactrim Ds 800-160 Mg Tabs (Sulfamethoxazole-Trimethoprim) .... One Tab By Mouth Two Times A Day 12)  Pyridium 200 Mg Tabs (Phenazopyridine Hcl) .... One Tab By Mouth Three Times A Day  Allergies: 1)  ! Codeine 2)  ! Macrobid 3)  ! Penicillin 4)  ! Vancomycin 5)  ! * Ivp Dye 6)  ! * Lunesta 7)   ! * Eplerenone 8)  ! * Tekturna 9)  ! Labetalol Hcl (Labetalol Hcl)  Physical Exam  General:  Well-developed,well-nourished,in no acute distress; alert,appropriate and cooperative throughout examination. Obese. Nontoxic but tired and ciongested.wearing surgical mask.  Head:  Normocephalic and atraumatic without obvious abnormalities. No apparent alopecia or balding. Sinuses nontender. Eyes:  Conjunctiva clear bilaterally.  Ears:  External ear exam shows no significant lesions or deformities.  Otoscopic examination reveals clear canals, tympanic membranes are intact bilaterally without bulging, retraction, inflammation or discharge. Hearing is grossly normal bilaterally. Nose:  External nasal examination shows no deformity or inflammation. Nasal mucosa are pink and moist without lesions or exudates. Mildly congested. Mouth:  Oral mucosa and oropharynx without lesions or exudates.  Teeth in good repair. Neck:  No deformities, masses, or tenderness noted. Chest Wall:  No deformities, masses, or tenderness noted. Lungs:  Normal respiratory effort, chest expands reasonably symmetrically. Lungs are clear to auscultation, no crackles, no  wheezes heard.  Heart:  Normal rate and regular rhythm. S1 and S2 normal without gallop, murmur, click, rub or other extra sounds.   Impression & Recommendations:  Problem # 1:  URI (ICD-465.9) Assessment New  Presume flu. Will try Tamiflu altho slightly longer than 48 hrs. Her updated medication list for this problem includes:    Aspirin 81 Mg Tbec (Aspirin) .Marland Kitchen... Take one by mouth daily    Promethazine Hcl 25 Mg Tabs (Promethazine hcl) ..... One tab by mouth every 6 hrs as needed for nausea.    Guaifenesin 400 Mg Tabs (Guaifenesin) .Marland Kitchen... Take 2 by mouth three times a day as needed  Instructed on symptomatic treatment. Call if symptoms persist or worsen.   Complete Medication List: 1)  Alprazolam 2 Mg Tabs (Alprazolam) .... One tab by mouth two times a  day 2)  Carisoprodol 350 Mg Tabs (Carisoprodol) .... Take one by mouth four times a day 3)  Aspirin 81 Mg Tbec (Aspirin) .... Take one by mouth daily 4)  Hydrocodone-acetaminophen 5-500 Mg Tabs (Hydrocodone-acetaminophen) .Marland Kitchen.. 1 or 2 tablets as needed for back pain by mouth 5)  Vitamin D 1000 U  .... Take 1 tablet by mouth two times a day 6)  Trazodone Hcl 100 Mg Tabs (Trazodone hcl) .Marland Kitchen.. 1 tablet at bedtime 7)  Nystatin 100000 Unit/ml Susp (Nystatin) .... Swish and spit 30cc 4 times a day for one week minimum. 8)  Catapres 0.2 Mg Tabs (Clonidine hcl) .... One tab by mouth two times a day and 1/2 at bedtime 9)  Promethazine Hcl 25 Mg Tabs (Promethazine hcl) .... One tab by mouth every 6 hrs as needed for nausea. 10)  Oxycodone Hcl 15 Mg Tabs (Oxycodone hcl) .... Take two by mouth every 4-6 hours as needed  11)  Pyridium 200 Mg Tabs (Phenazopyridine hcl) .... One tab by mouth three times a day 12)  Macrodantin 100 Mg Caps (Nitrofurantoin macrocrystal) .... Take one by mouth four times a day 13)  Guaifenesin 400 Mg Tabs (Guaifenesin) .... Take 2 by mouth three times a day as needed 14)  Tamiflu 75 Mg Caps (Oseltamivir phosphate) .... One tab by mouth once daily  Patient Instructions: 1)  RTC or call if sxs cont. Prescriptions: TAMIFLU 75 MG CAPS (OSELTAMIVIR PHOSPHATE) one tab by mouth once daily  #5 x 0   Entered and Authorized by:   Shaune Leeks MD   Signed by:   Shaune Leeks MD on 11/19/2009   Method used:   Electronically to        [facility] [phone]* (retail)       [address] [place], [place]  [address]       Ph: [phone] or [phone]       Fax: [phone]   RxID:   [id number]   Current Allergies (reviewed today): ! CODEINE ! MACROBID ! PENICILLIN ! VANCOMYCIN ! * IVP DYE ! Alfonso Patten ! * EPLERENONE ! * TEKTURNA ! LABETALOL HCL (LABETALOL HCL)

## 2010-11-26 NOTE — Cardiovascular Report (Signed)
Summary: Office Visit   Office Visit   Imported By: Roderic Ovens 01/04/2010 11:03:24  _____________________________________________________________________  External Attachment:    Type:   Image     Comment:   External Document

## 2010-11-26 NOTE — Procedures (Signed)
Summary: [facility]    Allergies: 1)  ! Codeine 2)  ! Penicillin 3)  ! Vancomycin 4)  ! * Ivp Dye 5)  ! * Lunesta 6)  ! * Eplerenone 7)  ! * Tekturna 8)  ! Labetalol Hcl (Labetalol Hcl)  PPM Specifications Following MD:  Sherryl Manges, MD     PPM Vendor:  Medtronic     PPM Model Number:  [id number]     PPM Serial Number:  [id number] PPM DOI:  12/07/2007     PPM Implanting MD:  Sherryl Manges, MD  Lead 1    Location: [facility]     DOI: 01/09/1996     Model #: 4524     Serial #: [id number]     Status: active Lead 2    Location: [facility]     DOI: 01/09/1996     Model #: [id number]     Serial #: [id number]     Status: active  Magnet Response Rate:  BOL 85 ERI  65  Indications:  Sick sinus syndrome   PPM Follow Up Remote Check?  No Battery Voltage:  2.79 V     Pacer Dependent:  No       PPM Device Measurements Atrium  Amplitude: 2.8 mV, Impedance: 726 ohms, Threshold: 1.0 V at 0.4 msec Right Ventricle  Amplitude: 15.6 mV, Impedance: 2028 ohms, Threshold: 1.75 V at 0.4 msec  Episodes MS Episodes:  3     Coumadin:  No  Parameters Mode:  DDDR+     Lower Rate Limit:  50     Upper Rate Limit:  130 Paced AV Delay:  150     Sensed AV Delay:  120 Next Remote Date:  10/24/2010     Next Cardiology Appt Due:  06/28/2011 Tech Comments:  Checked by [employer].  Known ventricular impedance and threshold higher than normal.  Rate response off today to decrease unnecessary atrial pacing.  Carelink transmissions every 3 months.  ROV 1 year with Dr.Klein in [place]. Altha Harm, LPN  July 24, 2010 4:25 PM

## 2010-11-26 NOTE — Letter (Signed)
Summary: Dr.Robert Creighton,[facility]  Dr.Robert Creighton,[facility]   Imported By: Beau Fanny 07/11/2010 13:49:29  _____________________________________________________________________  External Attachment:    Type:   Image     Comment:   External Document

## 2010-11-26 NOTE — Procedures (Signed)
Summary: PACER/AMD   Allergies: 1)  ! Codeine 2)  ! Macrobid 3)  ! Penicillin 4)  ! Vancomycin 5)  ! * Ivp Dye 6)  ! * Lunesta 7)  ! * Eplerenone 8)  ! * Tekturna 9)  ! Labetalol Hcl (Labetalol Hcl)   PPM Specifications PPM Vendor:  [employer]tronic     PPM Model Number:  [id number]     PPM Serial Number:  [id number] PPM DOI:  12/07/2007     PPM Implanting MD:  Sherryl Manges, MD  Lead 1    Location: [facility]     DOI: 01/09/1996     Model #: 4524     Serial #: [id number]     Status: active Lead 2    Location: RV     DOI: 01/09/1996     Model #: 4024     Serial #: [id number]     Status: active  Magnet Response Rate:  BOL 85 ERI  65  Indications:  Sick sinus syndrome

## 2010-11-26 NOTE — Assessment & Plan Note (Signed)
Summary: WEAKNESS, DEPRESSION, CRYING FOR NO REASON,RE-CK UTI.Marland Kitchen CYD   Vital Signs:  Patient profile:   55 year old female Weight:      247 pounds Temp:     98.4 degrees F oral Pulse rate:   76 / minute Pulse rhythm:   regular BP sitting:   142 / 100  (left arm) Cuff size:   large  Vitals Entered By: Sydell Axon LPN (December 13, 2009 12:07 PM) CC: Recheck urine for UTI, weakness, depression and crying for no reason   History of Present Illness: Pt here for depression...husband had his sedative changed from Xanax to Ativan and he became argumentative and aggressive. She is having depression, agitation and being short with her grandson....that is not her and bothers her.  She is having unusual sleep patterns and has lots of pain in her back. Her back is bothering her a lot and she is trying to get in with either Dr Wynetta Emery or a pain specialist. Dr Wynetta Emery seems to expect her to find her pain doctor. She is having a difficult time.  She had a concussion a few weeks ago and the change in mood started not long thereafter. She has no other lingering sxs from that that are elicitable. She is able to mentally do all nml functions. She is the glue that keeps the household together and continues with all aspects of that.  She is having headaches and lack of appetite as well..  Problems Prior to Update: 1)  Dysuria  (ICD-788.1) 2)  Uti  (ICD-599.0) 3)  Concussion With Loc of 30 Minutes or Less  (ICD-850.11) 4)  Chest Pain, Acute  (ICD-786.50) 5)  Rash and Other Nonspecific Skin Eruption, Les  (ICD-782.1) 6)  Cellulitis and Abscess of Legs and Torso  (ICD-682.9) 7)  Crushing Chest Pain  (ICD-786.50) 8)  Morbid Obesity (ENDOCRINE W/U NEG 2010)  (ICD-278.01) 9)  Labyrinthitis, Chronic  (ICD-386.30) 10)  Obstructive Sleep Apnea  (ICD-327.23) 11)  Unspecified Subjective Visual Disturbance  (ICD-368.10) 12)  Allergic Rhinitis  (ICD-477.9) 13)  Hx of Pulmonary Embolism  (ICD-415.19) 14)  Closed Fracture  of One Rib  (ICD-807.01) 15)  Osteoporosis  (ICD-733.00) 16)  Superficial Thrombophlebitis, L Calf  (ICD-451.0) 17)  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 Ct  (ICD-571.8) 23)  Irritable Bowel Syndrome With Duodenitis  (ICD-564.1) 24)  Disorder, Bipolar Nos  (ICD-296.80) 25)  Diverticulosis, Colon Via Ct  (ICD-562.10) 26)  Anxiety Disorder, Generalized  (ICD-300.02) 27)  Hypercholesterolemia  (ICD-272.0) 28)  Hypertension, Labile  (ICD-401.9) 29)  Knee Pain, Bilateral  (ICD-719.46)  Medications Prior to Update: 1)  Alprazolam 2 Mg Tabs (Alprazolam) .... One Tab By Mouth Two Times A Day 2)  Carisoprodol 350 Mg Tabs (Carisoprodol) .... Take One By Mouth Four Times A Day 3)  Aspirin 81 Mg  Tbec (Aspirin) .... Take One By Mouth Daily 4)  Hydrocodone-Acetaminophen 5-500 Mg  Tabs (Hydrocodone-Acetaminophen) .Marland Kitchen.. 1 or 2 Tablets As Needed For Back Pain By Mouth 5)  Vitamin D 1000 U .... Take 1 Tablet By Mouth Two Times A Day 6)  Trazodone Hcl 100 Mg Tabs (Trazodone Hcl) .Marland Kitchen.. 1 Tablet At Bedtime 7)  Nystatin 100000 Unit/ml Susp (Nystatin) .... Swish and Spit 30cc 4 Times A Day For One Week Minimum. 8)  Catapres 0.2 Mg Tabs (Clonidine Hcl) .... One Tab By Mouth Two Times A Day and 1/2 At Bedtime 9)  Promethazine Hcl 25 Mg Tabs (Promethazine Hcl) .... One Tab By Mouth Every 6 Hrs As Needed For Nausea. 10)  Oxycodone Hcl 15 Mg Tabs (Oxycodone Hcl) .... Take Two By Mouth Every 4-6 Hours As Needed 11)  Pyridium 200 Mg Tabs (Phenazopyridine Hcl) .... One Tab By Mouth Three Times A Day 12)  Macrodantin 100 Mg Caps (Nitrofurantoin Macrocrystal) .... Take One By Mouth Four Times A Day 13)  Guaifenesin 400 Mg Tabs (Guaifenesin) .... Take 2 By Mouth Three Times A Day As Needed 14)   Oxycodone Hcl 10 Mg Tabs (Oxycodone Hcl) .... Take Two By Mouth Every 4-6 Hours As Needed 15)  Tussionex Pennkinetic Er 8-10 Mg/52ml Lqcr (Chlorpheniramine-Hydrocodone) .... One Tsp By Mouth At Night As Needed For Cough  Allergies: 1)  ! Codeine 2)  ! Macrobid 3)  ! Penicillin 4)  ! Vancomycin 5)  ! * Ivp Dye 6)  ! * Lunesta 7)  ! * Eplerenone 8)  ! * Tekturna 9)  ! Labetalol Hcl (Labetalol Hcl)  Physical Exam  General:  Well-developed,well-nourished,in no acute distress; alert,appropriate and cooperative throughout examination. Obese. Tearful at times.  Head:  Normocephalic and atraumatic without obvious abnormalities. No apparent alopecia or balding. Sinuses nontender. Eyes:  Conjunctiva clear bilaterally.  Ears:  External ear exam shows no significant lesions or deformities.  Otoscopic examination reveals clear canals, tympanic membranes are intact bilaterally without bulging, retraction, inflammation or discharge. Hearing is grossly normal bilaterally. Nose:  External nasal examination shows no deformity or inflammation. Nasal mucosa are pink and moist without lesions or exudates. Mildly congested. Mouth:  Oral mucosa and oropharynx without lesions or exudates.  Teeth in good repair. Neck:  No deformities, masses, or tenderness noted. Lungs:  Normal respiratory effort, chest expands reasonably symmetrically. Lungs are clear to auscultation, no crackles, no  wheezes heard.  Heart:  Normal rate and regular rhythm. S1 and S2 normal without gallop, murmur, click, rub or other extra sounds. Psych:  Cognition and judgment appear intact. Alert and cooperative with normal attention span and concentration. No apparent delusions, illusions, hallucinations, generally good mood but tears up with discussion of attitudes toward some things and her lack of energy and control.   Impression & Recommendations:  Problem # 1:  DEPRESSION, SITUATIONAL (ICD-309.0) Assessment Deteriorated Really an  extension of her bipolar disorder. Really feel she needs to be seen by Psychiatry. We have used basicallt Trazodone for years with Xanax for control. Either her need has changed or her metabolism has changed but clearly her mood is different today ansd I do not want to trigger extremes. Suggest she see Psych. Her urine looks clean and I do not think this is the trigger this time. She has no inflammation to herald infection somewhere else.  Problem # 2:  DYSURIA (ICD-788.1) Assessment: Unchanged  Urine looks clean. I do not think she is having a UTI. The following medications were removed from the medication list:    Macrodantin 100 Mg Caps (Nitrofurantoin macrocrystal) .Marland Kitchen... Take one by mouth four times a day Her updated medication list for this problem includes:    Pyridium 200 Mg Tabs (Phenazopyridine hcl) ..... One tab by mouth three times a day  Orders: Specimen Handling (16109) T-Culture, Urine ([id number])  Encouraged to push clear liquids, get enough rest, and take acetaminophen as needed. To be seen in 10 days if no improvement,  sooner if worse.  Complete Medication List: 1)  Alprazolam 2 Mg Tabs (Alprazolam) .... One tab by mouth two times a day 2)  Carisoprodol 350 Mg Tabs (Carisoprodol) .... Take one by mouth four times a day 3)  Aspirin 81 Mg Tbec (Aspirin) .... Take one by mouth daily 4)  Hydrocodone-acetaminophen 5-500 Mg Tabs (Hydrocodone-acetaminophen) .Marland Kitchen.. 1 or 2 tablets as needed for back pain by mouth 5)  Vitamin D 1000 U  .... Take 1 tablet by mouth two times a day 6)  Trazodone Hcl 100 Mg Tabs (Trazodone hcl) .Marland Kitchen.. 1 tablet at bedtime 7)  Nystatin 100000 Unit/ml Susp (Nystatin) .... Swish and spit 30cc 4 times a day for one week minimum. 8)  Catapres 0.2 Mg Tabs (Clonidine hcl) .... One tab by mouth two times a day and 1/2 at bedtime 9)  Promethazine Hcl 25 Mg Tabs (Promethazine hcl) .... One tab by mouth every 6 hrs as needed for nausea. 10)  Oxycodone Hcl 15 Mg  Tabs (Oxycodone hcl) .... Take two by mouth every 4-6 hours as needed 11)  Pyridium 200 Mg Tabs (Phenazopyridine hcl) .... One tab by mouth three times a day 12)  Guaifenesin 400 Mg Tabs (Guaifenesin) .... Take 2 by mouth three times a day as needed  Other Orders: UA Dipstick W/ Micro (manual) (57846)  Patient Instructions: 1)  Will call if urine is indeed infected.  Current Allergies (reviewed today): ! CODEINE ! MACROBID ! PENICILLIN ! VANCOMYCIN ! * IVP DYE ! Alfonso Patten ! * EPLERENONE ! * TEKTURNA ! LABETALOL HCL (LABETALOL HCL)  Laboratory Results   Urine Tests  Date/Time Received: December 13, 2009 12:20 PM  Date/Time Reported: December 13, 2009 12:20 PM   Routine Urinalysis   Color: yellow Appearance: Clear Glucose: negative   (Normal Range: Negative) Bilirubin: negative   (Normal Range: Negative) Ketone: negative   (Normal Range: Negative) Spec. Gravity: 1.025   (Normal Range: 1.003-1.035) Blood: negative   (Normal Range: Negative) pH: 6.5   (Normal Range: 5.0-8.0) Protein: trace   (Normal Range: Negative) Urobilinogen: 0.2   (Normal Range: 0-1) Nitrite: negative   (Normal Range: Negative) Leukocyte Esterace: trace   (Normal Range: Negative)

## 2010-11-26 NOTE — Assessment & Plan Note (Signed)
Summary: DISCUSS MEDICATIONS/CLE   Vital Signs:  Patient profile:   55 year old female Weight:      247.75 pounds Temp:     98.6 degrees F oral Pulse rate:   84 / minute Pulse rhythm:   regular BP sitting:   148 / 100  (left arm) Cuff size:   large  Vitals Entered By: Sydell Axon LPN (April 18, 2010 10:45 AM) CC: Discuss medications   History of Present Illness: Pt here with her grandson. She has learned cellphones bother her pacer.  She is continuing to have significant fatigue. She had been up to Clonidine 0.2 three times a day and she decreased her Xanax and Clonidine  and felt better but BP went up.   Her right knee is also bothering her.  She also has fairly frequent nauseous sick feeling in the stomach. She also has had multiple tick bites.   Problems Prior to Update: 1)  Uri  (ICD-465.9) 2)  Fatigue  (ICD-780.79) 3)  Insect Bite  (ICD-919.4) 4)  Depression, Situational  (ICD-309.0) 5)  Dysuria  (ICD-788.1) 6)  Uti  (ICD-599.0) 7)  Concussion With Loc of 30 Minutes or Less  (ICD-850.11) 8)  Chest Pain, Acute  (ICD-786.50) 9)  Rash and Other Nonspecific Skin Eruption, Les  (ICD-782.1) 10)  Cellulitis and Abscess of Legs and Torso  (ICD-682.9) 11)  Crushing Chest Pain  (ICD-786.50) 12)  Morbid Obesity (ENDOCRINE W/U NEG 2010)  (ICD-278.01) 13)  Labyrinthitis, Chronic  (ICD-386.30) 14)  Obstructive Sleep Apnea  (ICD-327.23) 15)  Unspecified Subjective Visual Disturbance  (ICD-368.10) 16)  Allergic Rhinitis  (ICD-477.9) 17)  Hx of Pulmonary Embolism  (ICD-415.19) 18)  Closed Fracture of One Rib  (ICD-807.01) 19)  Osteoporosis  (ICD-733.00) 20)  Superficial Thrombophlebitis, L Calf  (ICD-451.0) 21)  Insomnia-sleep Disorder-unspec  (ICD-307.40) 22)  Pain, Chronic Nec (BACK AND KNEE)  (ICD-338.29) 23)  Pulmonary Embolism, Hx of  (ICD-V12.51) 24)  Status, Cardiac Pacemaker  (ICD-V45.01) 25)  Achilles Tendon Tear  (ICD-845.09) 26)  Fatty Liver Disease Via Abd. Ct   (ICD-571.8) 27)  Irritable Bowel Syndrome With Duodenitis  (ICD-564.1) 28)  Disorder, Bipolar Nos  (ICD-296.80) 29)  Diverticulosis, Colon Via Ct  (ICD-562.10) 30)  Anxiety Disorder, Generalized  (ICD-300.02) 31)  Hypercholesterolemia  (ICD-272.0) 32)  Hypertension, Labile  (ICD-401.9) 33)  Knee Pain, Bilateral  (ICD-719.46)  Medications Prior to Update: 1)  Alprazolam 2 Mg Tabs (Alprazolam) .... One Tab By Mouth Two Times A Day As Needed For Anxiety 2)  Carisoprodol 350 Mg Tabs (Carisoprodol) .... Take One By Mouth Four Times A Day 3)  Trazodone Hcl 100 Mg Tabs (Trazodone Hcl) .Marland Kitchen.. 1 Tablet At Bedtime 4)  Nystatin 100000 Unit/ml Susp (Nystatin) .... Swish and Spit 30cc 4 Times A Day For One Week Minimum. 5)  Catapres 0.2 Mg Tabs (Clonidine Hcl) .... Take One By Mouth Three Times A Day 6)  Promethazine Hcl 25 Mg Tabs (Promethazine Hcl) .... One Tab By Mouth Every 6 Hrs As Needed For Nausea. 7)  Nucynta 50 Mg Tabs (Tapentadol Hcl) .... Take 1-2 By Mouth Every 6 Hours As Needed 8)  Zithromax Z-Pak 250 Mg Tabs (Azithromycin) .... As Dir  Allergies: 1)  ! Codeine 2)  ! Macrobid 3)  ! Penicillin 4)  ! Vancomycin 5)  ! * Ivp Dye 6)  ! * Lunesta 7)  ! * Eplerenone 8)  ! * Tekturna 9)  ! Labetalol Hcl (Labetalol Hcl)  Physical Exam  General:  Well-developed,well-nourished,in no acute distress; alert,appropriate and cooperative throughout examination. Obese.  Mildly hoarse but continues to tallk and most of the time normal voice until she has been talking a lot. Head:  Normocephalic and atraumatic without obvious abnormalities. No apparent alopecia or balding. Sinuses nontender. Eyes:  Conjunctiva clear bilaterally.  Ears:  External ear exam shows no significant lesions or deformities.  Otoscopic examination reveals clear canals, tympanic membranes are intact bilaterally without bulging, retraction, inflammation or discharge. Hearing is grossly normal bilaterally. Nose:  External nasal  examination shows no deformity or inflammation. Nasal mucosa are pink and moist without lesions or exudates. Mouth:  Oral mucosa and oropharynx without lesions or exudates.  Minimal erythema. Teeth in good repair. Neck:  No deformities, masses, or tenderness noted. Lungs:  Normal respiratory effort, chest expands reasonably symmetrically. Lungs are clear to auscultation, no crackles, no  wheezes heard.  Heart:  Normal rate and regular rhythm. S1 and S2 normal without gallop, murmur, click, rub or other extra sounds.   Impression & Recommendations:  Problem # 1:  FATIGUE (ICD-780.79) Assessment Unchanged Stay on the decreased 1/2 tab of Trazodone for a while. Then decrease Xanax to 1/2 tab as needed. Do not change Clonidine until on decreased Trazodone and Xanax stable for at least one month. Then assess BP prior to doing anything with Clonidine and if ok, may decrease Clonidine by 1 1/2 pill a daqy, i.e.  1/2 in AM  1 Midday and night for a few weeks.  Problem # 2:  INSECT BITE (ICD-919.4) Tick bites, does not seem symptomatic from that.  Problem # 3:  ANXIETY DISORDER, GENERALIZED (ICD-300.02) Assessment: Unchanged Seems pretty good today. Her updated medication list for this problem includes:    Alprazolam 2 Mg Tabs (Alprazolam) ..... One tab by mouth two times a day as needed for anxiety    Trazodone Hcl 100 Mg Tabs (Trazodone hcl) .Marland Kitchen... 1 tablet at bedtime  Problem # 4:  KNEE PAIN, BILATERAL (ICD-719.46) Assessment: Unchanged  Today R>L. Again discussed wt loss. Would do nothing alse. Is able to ambulate and to pretty much what she wants via her knees. She is on too much medication to try anything else. Heat and ice are fine. Her updated medication list for this problem includes:    Carisoprodol 350 Mg Tabs (Carisoprodol) .Marland Kitchen... Take one by mouth four times a day    Nucynta 50 Mg Tabs (Tapentadol hcl) .Marland Kitchen... Take 1-2 by mouth every 6 hours as needed  Complete Medication List: 1)   Alprazolam 2 Mg Tabs (Alprazolam) .... One tab by mouth two times a day as needed for anxiety 2)  Carisoprodol 350 Mg Tabs (Carisoprodol) .... Take one by mouth four times a day 3)  Trazodone Hcl 100 Mg Tabs (Trazodone hcl) .Marland Kitchen.. 1 tablet at bedtime 4)  Nystatin 100000 Unit/ml Susp (Nystatin) .... Swish and spit 30cc 4 times a day for one week minimum. 5)  Catapres 0.2 Mg Tabs (Clonidine hcl) .... Take one by mouth three times a day 6)  Promethazine Hcl 25 Mg Tabs (Promethazine hcl) .... One tab by mouth every 6 hrs as needed for nausea. 7)  Nucynta 50 Mg Tabs (Tapentadol hcl) .... Take 1-2 by mouth every 6 hours as needed 8)  Zithromax Z-pak 250 Mg Tabs (Azithromycin) .... As dir  Patient Instructions: 1)  RTC as needed. 2)  25 mins spent with pt.  Current Allergies (reviewed today): ! CODEINE ! MACROBID ! PENICILLIN ! VANCOMYCIN ! * IVP DYE ! *  LUNESTA ! * EPLERENONE ! * TEKTURNA ! LABETALOL HCL (LABETALOL HCL)

## 2010-11-26 NOTE — Letter (Signed)
Summary: Generic Letter  [place] [facility]  [address] [place], [place] [address]   Phone: [phone]  Fax: [phone]    04/25/2010   TO [facility] IT MAY CONCERN MEDICAID ADMINISTRATION [id number] [address] [place], [place]  [address]  Ms Susan Howell is a patient of mine who has multiple medical problems which require her to be seen frequently and by many different providers. These problems include among others, Hypertension, Chest Pain, Cardiac Pacemaker for Bradycardia, Fatigue, Irritable Bowel Syndrome, Bipolar Disorder, Situational Depression, Anxiety Disorder, Morbid Obesity with Chronic Bilateral Knee Pain and Obstructive Sleep Apnea.   If I can be of further service or provide further information, please don't hesitate to ask.  Sincerely,    Laurita Quint MD

## 2010-11-26 NOTE — Letter (Signed)
Summary: USPS Domestic Return Receipt  USPS Domestic Return Receipt   Imported By: Lenard Forth 07/18/2010 17:17:18  _____________________________________________________________________  External Attachment:    Type:   Image     Comment:   External Document

## 2010-11-26 NOTE — Progress Notes (Signed)
Summary: refill request for alprazolam  Phone Note Refill Request Message from:  Fax from Pharmacy  Refills Requested: Medication #1:  ALPRAZOLAM 2 MG TABS one tab by mouth two times a day as needed for anxiety   Last Refilled: 03/06/2010 [facility] [address]  [phone].  Initial call taken by: Lowella Petties CMA,  April 04, 2010 1:31 PM  Follow-up for Phone Call        [facility] Follow-up by: Lowella Petties CMA,  April 04, 2010 3:30 PM    Prescriptions: ALPRAZOLAM 2 MG TABS (ALPRAZOLAM) one tab by mouth two times a day as needed for anxiety  #60 x 0   Entered and Authorized by:   Shaune Leeks MD   Signed by:   Shaune Leeks MD on 04/04/2010   Method used:   Telephoned to ...       [facility] [phone]* (retail)       [address] [place], [place]  [address]       Ph: [phone] or [phone]       Fax: [phone]   RxID:   [id number]

## 2010-11-26 NOTE — Progress Notes (Signed)
Summary: Clarification on Tamiflu rx.  Phone Note From Pharmacy Call back at [phone]   [facility] [phone]* pharmacist Dieu-ha Call For: Dr. Hetty Ely  Summary of Call: [facility] [address] said if pt has flu should instructions read for Tamiflu 75mg  take one tablet twice a day. Pt has had too many rx this month and has to get approved by Medicaid. Pt is waiting. Please advise.  Initial call taken by: Lewanda Rife LPN,  November 19, 2009 1:51 PM  Follow-up for Phone Call        Should be two times a day. Thanks. Follow-up by: Shaune Leeks MD,  November 19, 2009 1:55 PM  Additional Follow-up for Phone Call Additional follow up Details #1::        [facility] [address] not[facility] as instructed.Lewanda Rife LPN  November 19, 2009 1:56 PM

## 2010-11-26 NOTE — Progress Notes (Signed)
Summary: alprazolam   Phone Note Refill Request Message from:  Fax from Pharmacy on August 30, 2010 9:23 AM  Refills Requested: Medication #1:  ALPRAZOLAM 2 MG TABS one tab by mouth two times a day as needed for anxiety   Last Refilled: 07/31/2010 [facility] [address] [phone]  Initial call taken by: Melody Comas,  August 30, 2010 9:24 AM  Follow-up for Phone Call        please call in .  Follow-up by: Crawford Givens MD,  August 30, 2010 9:36 AM  Additional Follow-up for Phone Call Additional follow up Details #1::        Medication phoned to pharmacy.  Additional Follow-up by: Delilah Shan CMA (AAMA),  August 30, 2010 10:09 AM    Prescriptions: ALPRAZOLAM 2 MG TABS (ALPRAZOLAM) one tab by mouth two times a day as needed for anxiety  #60 x 1   Entered and Authorized by:   Crawford Givens MD   Signed by:   Crawford Givens MD on 08/30/2010   Method used:   Telephoned to ...       [facility] [phone]* (retail)       [address] [place], [place]  [address]       Ph: [phone] or [phone]       Fax: [phone]   RxID:   [id number]

## 2010-11-26 NOTE — Progress Notes (Signed)
Summary: Alprazolam  Phone Note Refill Request Message from:  Fax from Pharmacy on July 02, 2010 10:51 AM  Refills Requested: Medication #1:  ALPRAZOLAM 2 MG TABS one tab by mouth two times a day as needed for anxiety [facility]  Phone:   [phone]   Method Requested: Telephone to Pharmacy Initial call taken by: Delilah Shan CMA Duncan Dull),  July 02, 2010 10:52 AM  Follow-up for Phone Call        call in and get records from [facility]- Psychologist with [facility]  her appt was 06/26/2010 at 10:00am.  Follow-up by: Crawford Givens MD,  July 02, 2010 2:05 PM  Additional Follow-up for Phone Call Additional follow up Details #1::        Medication phoned to pharmacy.   Records requested.  Lugene Fuquay CMA Duncan Dull)  July 02, 2010 2:39 PM     Prescriptions: ALPRAZOLAM 2 MG TABS (ALPRAZOLAM) one tab by mouth two times a day as needed for anxiety  #60 x 0   Entered and Authorized by:   Crawford Givens MD   Signed by:   Crawford Givens MD on 07/02/2010   Method used:   Telephoned to ...       [facility] [phone]* (retail)       [address] [place], [place]  [address]       Ph: [phone] or [phone]       Fax: [phone]   RxID:   [id number]   Appended Document: Alprazolam She was a "No Show" to this appointment.  Appended Document: Alprazolam noted.

## 2010-11-26 NOTE — Cardiovascular Report (Signed)
Summary: Office Visit Remote   Office Visit Remote   Imported By: Roderic Ovens 05/10/2010 16:13:56  _____________________________________________________________________  External Attachment:    Type:   Image     Comment:   External Document

## 2010-11-26 NOTE — Progress Notes (Signed)
Summary: Carisoprodol  Phone Note Refill Request Message from:  [facility] on July 31, 2010 10:05 AM  Refills Requested: Medication #1:  CARISOPRODOL 350 MG TABS Take one by mouth four times a day  Medication #2:  ALPRAZOLAM 2 MG TABS one tab by mouth two times a day as needed for anxiety   Last Ref[facility]: [facility]/02/2010 [facility]  [address] [facility] [phone]*   Last Fill Date:  No date sent   Pharmacy Phone:  [phone]   Method Requested: Electronic Initial call taken by: Delilah Shan CMA (AAMA),  July 31, 2010 10:05 AM  Follow-up for Phone Call        please call in. Follow-up by: Crawford Givens MD,  July 31, 2010 11:39 AM  Additional Follow-up for Phone Call Additional follow up Details #1::        Rx called in to pharmacy.  Additional Follow-up by: Melody Comas,  July 31, 2010 1:37 PM    Prescriptions: CARISOPRODOL 350 MG TABS (CARISOPRODOL) Take one by mouth four times a day  #120 x 1   Entered by:   Melody Comas   Authorized by:   Crawford Givens MD   Signed by:   Melody Comas on 07/31/2010   Method used:   Telephoned to ...       [facility] [phone]* (retail)       [address] [place], [place]  [address]       Ph: [phone] or [phone]       Fax: [phone]   RxID:   [id number] CARISOPRODOL 350 MG TABS (CARISOPRODOL) Take one by mouth four times a day  #120 x 1   Entered and Authorized by:   Crawford Givens MD   Signed by:   Crawford Givens MD on 07/31/2010   Method used:   Telephoned to ...       [facility] [phone]* (retail)       [address] [place], [place]  [address]       Ph: [phone] or [phone]       Fax: [phone]   RxID:   [id number] ALPRAZOLAM 2 MG TABS (ALPRAZOLAM) one tab by mouth two times a day as needed for anxiety  #60 x 1   Entered and Authorized by:   Crawford Givens MD   Signed by:   Crawford Givens MD on 07/31/2010   Method used:   Telephoned to ...       [facility] [phone]* (retail)       [address] [place], [place]  [address]       Ph: [phone] or [phone]       Fax: [phone]   RxID:   [id number]

## 2010-11-26 NOTE — Assessment & Plan Note (Signed)
Summary: transfer from dr. Schaller/alc   Vital Signs:  Patient profile:   55 year old female Height:      61 inches Weight:      248.50 pounds BMI:     47.12 Temp:     98.9 degrees F oral Pulse rate:   84 / minute Pulse rhythm:   regular BP sitting:   126 / 106  (left arm) Cuff size:   large  Vitals Entered By: Delilah Shan CMA Duncan Dull) (May 29, 2010 1:56 PM) CC: Transfer from [facility]   History of Present Illness: Back pain- long standing.  Prev surgery hx reviwed.  No change in meds recently.  Pain is at baseline per patient.  Lower lumbar is typical area of discomfort. All meds came from Dr. Hetty Ely.  Asking about patient mgmt referral.   Anxiety- No change in meds recently.  No SI/HI.    dysuria: since Monday duration of symptoms:intermittent abdominal pain: at baselin per patient fevers:no back pain:at baseline vomiting:no  Patient is tangential in converation.  However, she is alert and oriented and I do not appreciate her coversation today to be different than her baseline.    Current Medications (verified): 1)  Alprazolam 2 Mg Tabs (Alprazolam) .... One Tab By Mouth Two Times A Day As Needed For Anxiety 2)  Carisoprodol 350 Mg Tabs (Carisoprodol) .... Take One By Mouth Four Times A Day 3)  Trazodone Hcl 100 Mg Tabs (Trazodone Hcl) .Marland Kitchen.. 1 Tablet At Bedtime 4)  Nystatin 100000 Unit/ml Susp (Nystatin) .... Swish and Spit 30cc 4 Times A Day For One Week Minimum. 5)  Catapres 0.2 Mg Tabs (Clonidine Hcl) .... Take One By Mouth Three Times A Day 6)  Promethazine Hcl 25 Mg Tabs (Promethazine Hcl) .... One Tab By Mouth Every 6 Hrs As Needed For Nausea. 7)  Oxycodone-Acetaminophen 5-500 Mg Caps (Oxycodone-Acetaminophen) .... Take One Tablet Every 4 To 6 Hours As Needed For Pain 8)  Aspirin 81 Mg Chew (Aspirin) .Marland Kitchen.. 1 By Mouth Qday  Allergies: 1)  ! Codeine 2)  ! Macrobid 3)  ! Penicillin 4)  ! Vancomycin 5)  ! * Ivp Dye 6)  ! * Lunesta 7)  ! * Eplerenone 8)  ! *  Tekturna 9)  ! Labetalol Hcl (Labetalol Hcl)  Past History:  Past Surgical History: Last updated: 11/22/2009 Tonsillectomy Stomach Stapling w/ subphrenic abscess and part gastrectomy 1979 Choleycystectomy Back Surgery w/ [facility] Rods due to fx'd vertebrae (Dr Wynetta Emery)  Nerve Entrapment Surg after Laparascopic L Heel Surgery Hysterectomy complete secondary endometriosis 06/00 ETT Graciela Husbands) no tachypalpitaions 09/00 Ganglon cystecomy left foot 09/03/00 CT of head with and without contrast wnl 06/02 Stress cardilite wnl EF 68% 09/07/01 Cystoscoopy (Evans) ICS 09/09/02 Adenosone Cardiolte negative ischEF 74% Lymph node bx. benign (Cerame) 04/07/03 EGD S/P past fundoplication with prior folds 09/15/03 Abd vasc U/S no aneurysm 01/13/05 Abd. CT winl, Pelvic CT wnl 01/17/05 Adenosone myoview wnl EF 66% 01/13/05 Sleep study mild obst. sleep apnea 81% 08/26/05 CATH wnl EF 65% 03/18/06 [facility] Chest R/O changes- GERD 03/21-03/22/07 CT Chest negative P. E. 12/25/06 Colonoscopy, divertics, ext hemorrhoids, poss polyp bx negative 07/10/06 Head CT with and without negative 07/2006 Carotid U/S wnl 09/28/06 Technetium Thyroid Uptake Scan nml 11/24/07 Replacement of implanted permanent Pacer 12/08/07 CT Head w/ w/o Nml 06/21/08 Sleep Pressure Optimization Study   10cm good control extra small Quatro full face mask(Dr Clance) 05/07/08 Appy (Dr Janee Morn) 07/27/09 CT Head No acute Abnml, Sm scalp  hematoma  CT Cerv Spine Nml  11/20/2009  Past Medical History: Diverticulosis, colon Hypertension H/o superficial dvt; h/o PE after surgery in 1979 Allergic Rhinitis Sleep Apnea knee pain anxiety h/o dysuria Prev dx: BAD Disability for back pain orthostatic hypertension h/o interstitial cystitis MDs:Dr. Graciela Husbands with cards for pacer Dr. Wynetta Emery for Neurosurgery Dr. Hyman Hopes with renal Dr. Logan Bores with uro Dr. Ferdinand Lango with endo  Dr. Peterson Ao [facility] at [facility]  Family History: Reviewed history from 06/06/2008  and no changes required. Father A, pacemaker Mother-COPD, Alzheimer's disease Brother dec Sudden Death ?MI Bipolar Brother- brain tumor Cousin-bone CA  Social History: Reviewed history from 06/06/2008 and no changes required. Patient never smoked.  Pt is married  since 1981 with 1 child.   Pt is disabled. 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  Otherwise negative.  She has tangential statements, but to the best of my ability I am unable to find other pertinent postives that would apply to ROS.   Physical Exam  General:  GEN: nad, alert and oriented, obese HEENT: mucous membranes moist NECK: supple w/o LA CV: rrr PULM: ctab, no inc wob ABD: soft, +bs EXT: no edema BACK: tender to palpation in lower lumbar area bilaterally, prev scars well healed. Gait with limp due to back pain but otherwise steady.    Impression & Recommendations:  Problem # 1:  DYSURIA (ICD-788.1) Check Ucx and treat if positive.  >25 min spent with patient.   Orders: Specimen Handling ([address]) T-Culture, Urine ([id number])  Problem # 2:  PAIN, CHRONIC NEC (BACK AND KNEE) (ICD-338.29)  Refer to pain mgmt.  She has had a complicated course after surgery before and I would appreciate patient mgmt input on her meds.  If she declines future intervention/surgery, then she would need effective symptom control.  I would like pain mgmt input  to limit the amount of potentially sedating meds in this patient.  she agrees and would like referral.   Orders: Pain Clinic Referral (Pain)  Problem # 3:  ANXIETY DISORDER, GENERALIZED (ICD-300.02) No change in meds today.  No SI/HI.  Declines psych referral.  She appears to be a baseline and her anxiety appears to be controlled.   Her updated medication list for this problem includes:    Alprazolam 2 Mg Tabs (Alprazolam) ..... One tab by mouth two times a day as needed for anxiety    Trazodone  Hcl 100 Mg Tabs (Trazodone hcl) .Marland Kitchen... 1 tablet at bedtime  Complete Medication List: 1)  Alprazolam 2 Mg Tabs (Alprazolam) .... One tab by mouth two times a day as needed for anxiety 2)  Carisoprodol 350 Mg Tabs (Carisoprodol) .... Take one by mouth four times a day 3)  Trazodone Hcl 100 Mg Tabs (Trazodone hcl) .Marland Kitchen.. 1 tablet at bedtime 4)  Nystatin 100000 Unit/ml Susp (Nystatin) .... Swish and spit 30cc 4 times a day for one week minimum. 5)  Catapres 0.2 Mg Tabs (Clonidine hcl) .... Take one by mouth three times a day 6)  Promethazine Hcl 25 Mg Tabs (Promethazine hcl) .... One tab by mouth every 6 hrs as needed for nausea. 7)  Oxycodone-acetaminophen 5-500 Mg Caps (Oxycodone-acetaminophen) .... Take one tablet every 4 to 6 hours as needed for pain 8)  Aspirin 81 Mg Chew (Aspirin) .Marland Kitchen.. 1 by mouth qday  Patient Instructions: 1)  We'll contact you with your lab report.  I want you to come back  for a fasting lipid panel in 1 or 2 weeks.  Dx 272.0. 2)  I want to see you back in 3 months (30 min appointment).  3)  See Shirlee Limerick about your referral before your leave today.   Prescriptions: ALPRAZOLAM 2 MG TABS (ALPRAZOLAM) one tab by mouth two times a day as needed for anxiety  #60 x 0   Entered and Authorized by:   Crawford Givens MD   Signed by:   Crawford Givens MD on 05/29/2010   Method used:   Print then Give to Patient   RxID:   [id number]   Current Allergies (reviewed today): ! CODEINE ! MACROBID ! PENICILLIN ! VANCOMYCIN ! * IVP DYE ! Alfonso Patten ! * EPLERENONE ! * TEKTURNA ! LABETALOL HCL (LABETALOL HCL)  Laboratory Results   Urine Tests  Date/Time Received: May 29, 2010 3:26 PM   Routine Urinalysis   Color: yellow Appearance: Hazy Glucose: negative   (Normal Range: Negative) Bilirubin: negative   (Normal Range: Negative) Ketone: negative   (Normal Range: Negative) Spec. Gravity: >=1.030   (Normal Range: 1.003-1.035) Blood: negative   (Normal Range: Negative) pH:  6.0   (Normal Range: 5.0-8.0) Protein: negative   (Normal Range: Negative) Urobilinogen: 0.2   (Normal Range: 0-1) Nitrite: positive   (Normal Range: Negative) Leukocyte Esterace: small   (Normal Range: Negative)

## 2010-11-26 NOTE — Progress Notes (Signed)
Summary: Personal issues  Phone Note Call from Patient Call back at Home Phone [phone]   Caller: Patient Call For: Shaune Leeks MD Summary of Call: Patient would like to speak to you regarding some personal issues.   Initial call taken by: Linde Gillis CMA Duncan Dull),  January 31, 2010 2:45 PM  Follow-up for Phone Call        Has had a bad week and has been taking Xanax three times a day. They had a problem with one of their tenants, husband was arrested and locked up for over a week. Patient is going to see her pain management doctor tomorrow and she wanted you to be aware that she took this amount in case he calls you to discuss this with you. Patient fell  and went back to [facility] because of a fall on 03/17/11and will be following up with you. Follow-up by: Sydell Axon LPN,  January 31, 2010 4:45 PM  Additional Follow-up for Phone Call Additional follow up Details #1::        Noted. Additional Follow-up by: Shaune Leeks MD,  January 31, 2010 4:56 PM

## 2010-11-26 NOTE — Progress Notes (Signed)
Summary: refill request for phenergan  Phone Note Refill Request Message from:  [employer]  Refills Requested: Medication #1:  PROMETHAZINE HCL 25 MG TABS one tab by mouth every 6 hrs as needed for nausea. [facility] [address]  No last filled date given.  Initial call taken by: Lowella Petties CMA,  July 26, 2010 10:52 AM  Follow-up for Phone Call        done.  plz notify patient. Follow-up by: Eustaquio Boyden  MD,  July 26, 2010 11:43 AM  Additional Follow-up for Phone Call Additional follow up Details #1::        Patient aware Additional Follow-up by: Janee Morn CMA Duncan Dull),  July 26, 2010 12:38 PM    Prescriptions: PROMETHAZINE HCL 25 MG TABS (PROMETHAZINE HCL) one tab by mouth every 6 hrs as needed for nausea.  #30 x 1   Entered and Authorized by:   Eustaquio Boyden  MD   Signed by:   Eustaquio Boyden  MD on 07/26/2010   Method used:   [facility]        [facility] [phone][facility] (retail)       [address] [place], [place]  [address]       Ph: [phone] or [phone]       Fax: [phone]   RxID:   [id number]

## 2010-11-26 NOTE — Letter (Signed)
Summary: Remote Device Check  [facility], Main Office  [address]   [place], [place] [address]   Phone: [phone]  Fax: [phone]     May 09, 2010 MRN: [id number]   Susan Howell [address] [place], [place]  [address]   Dear [name],   Your remote transmission was recieved and reviewed by your physician.  All diagnostics were within normal limits for you.  __C____Your next office visit is scheduled for:   September 2011 with Dr Graciela Husbands. Please call our office to schedule an appointment.    Sincerely,  Vella Kohler

## 2010-11-26 NOTE — Assessment & Plan Note (Signed)
Summary: uti and consult??/dlo   Vital Signs:  Patient profile:   55 year old female Height:      61 inches Weight:      242.50 pounds BMI:     45.99 Temp:     98.5 degrees F oral Pulse rate:   88 / minute Pulse rhythm:   regular BP sitting:   136 / 104  (left arm) Cuff size:   large  Vitals Entered By: Delilah Shan CMA Duncan Dull) (August 13, 2010 10:48 AM) CC: ? UTI and consult.  Currently on Bactrim DS   History of Present Illness: ?UTI- Already on bactrim.  Went to UC in [place].  Better now. Ua neg today.    Saw psych, Dr. Janeece Riggers.  I reviewed notes when they came in yesterday.  "I have the manic part of bipolar, and it comes out in my mouth (speech)."  Prev intolerant of depakote and other meds.  Dr. Janeece Riggers had rec'd geodon.  Stopped trazodone and started geodon.  tolerated it the first night but had insomnia after coming  off the trazodone.  She was concerned about h/o DM2 in the family and her weight.  She is off geodon.    Asking about mango juice/supplement.  Also discussed preacher and religious preference.   She switches topics frequently and abruptly.    Allergies: 1)  ! Codeine 2)  ! Penicillin 3)  ! Vancomycin 4)  ! * Ivp Dye 5)  ! * Lunesta 6)  ! * Eplerenone 7)  ! * Tekturna 8)  ! Labetalol Hcl (Labetalol Hcl)  Review of Systems       See HPI.  Otherwise negative.    Physical Exam  General:  A&O, pressured speech at baseline from prev exams.  remainder deferred.    Impression & Recommendations:  Problem # 1:  DISORDER, BIPOLAR NOS (ICD-296.80) >25 min spent with patient, at least half of which was spent on counseling ZO:XWRU.  She needs psych help before pain clinic can intervene.  She needs to decide if she will d/w Dr. Janeece Riggers or if she wants to go to another clinic.  I will await her decision.  I advised her to at least discuss her concerns with Dr. Janeece Riggers.  This was explained in detail.   Problem # 2:  DYSURIA (ICD-788.1) Sx resolved.  Rx for pyridium done in  case of return of symptoms- she'll seek eval at that point.  told patient to finish antibiotics.  Her updated medication list for this problem includes:    Pyridium 200 Mg Tabs (Phenazopyridine hcl) .Marland Kitchen... 1 by mouth three times a day x2 days for dysuria- follow up with md if needing this med  Complete Medication List: 1)  Alprazolam 2 Mg Tabs (Alprazolam) .... One tab by mouth two times a day as needed for anxiety 2)  Carisoprodol 350 Mg Tabs (Carisoprodol) .... Take one by mouth four times a day 3)  Trazodone Hcl 100 Mg Tabs (Trazodone hcl) .Marland Kitchen.. 1 tablet at bedtime 4)  Nystatin 100000 Unit/ml Susp (Nystatin) .... Swish and spit 30cc 4 times a day for one week minimum. 5)  Catapres 0.2 Mg Tabs (Clonidine hcl) .... Take one by mouth three times a day 6)  Promethazine Hcl 25 Mg Tabs (Promethazine hcl) .... One tab by mouth every 6 hrs as needed for nausea. 7)  Pyridium 200 Mg Tabs (Phenazopyridine hcl) .Marland Kitchen.. 1 by mouth three times a day x2 days for dysuria- follow up with md if needing  this med  Patient Instructions: 1)  Let me know if you want to go to another psychiatrist.  I will await your response.  Take care.  I would finish the antibiotics.   Prescriptions: PYRIDIUM 200 MG TABS (PHENAZOPYRIDINE HCL) 1 by mouth three times a day x2 days for dysuria- follow up with MD if needing this med  #6 x 3   Entered and Authorized by:   Crawford Givens MD   Signed by:   Crawford Givens MD on 08/13/2010   Method used:   Electronically to        [facility] [phone]* (retail)       [address] [place], [place]  [address]       Ph: [phone] or [phone]       Fax: [phone]   RxID:   [id number]    Orders Added: 1)  Est. Patient Level IV [[address]]    Current Allergies (reviewed today): ! CODEINE ! PENICILLIN ! VANCOMYCIN ! * IVP DYE ! Alfonso Patten ! * EPLERENONE ! * TEKTURNA ! LABETALOL HCL (LABETALOL HCL) Laboratory Results   Urine Tests  Date/Time  Received: August 13, 2010 11:09 AM   Routine Urinalysis   Color: yellow Appearance: Clear Glucose: negative   (Normal Range: Negative) Bilirubin: negative   (Normal Range: Negative) Ketone: negative   (Normal Range: Negative) Spec. Gravity: 1.015   (Normal Range: 1.003-1.035) Blood: negative   (Normal Range: Negative) pH: 6.5   (Normal Range: 5.0-8.0) Protein: negative   (Normal Range: Negative) Urobilinogen: 0.2   (Normal Range: 0-1) Nitrite: negative   (Normal Range: Negative) Leukocyte Esterace: negative   (Normal Range: Negative)

## 2010-11-26 NOTE — Progress Notes (Signed)
Summary: Urine sample  Phone Note Outgoing Call   Call placed by: Sydell Axon, LPN Call placed to: Patient Summary of Call: Called patient to give her urine results. Was informed by patient that she had been off of the Macrobid for at least a week when she gave the urine sample last week. Please advise. Initial call taken by: Sydell Axon LPN,  December 03, 2009 10:20 AM  Follow-up for Phone Call        Have pt repeat in one week. Follow-up by: Shaune Leeks MD,  December 03, 2009 2:00 PM  Additional Follow-up for Phone Call Additional follow up Details #1::        Patient notified as instructed by telephone. Additional Follow-up by: Sydell Axon LPN,  December 03, 2009 2:10 PM

## 2010-11-26 NOTE — Progress Notes (Signed)
Summary: refill request for alprazolam  Phone Note Refill Request Message from:  Fax from Pharmacy  Refills Requested: Medication #1:  ALPRAZOLAM 2 MG TABS one tab by mouth two times a day as needed for anxiety   Last Refilled: 02/07/2010 [facility] [address], [phone]  Initial call taken by: Lowella Petties CMA,  Mar 06, 2010 2:50 PM  Follow-up for Phone Call        [facility]  Follow-up by: Lowella Petties CMA,  Mar 06, 2010 4:38 PM    Prescriptions: ALPRAZOLAM 2 MG TABS (ALPRAZOLAM) one tab by mouth two times a day as needed for anxiety  #60 x 0   Entered and Authorized by:   Shaune Leeks MD   Signed by:   Shaune Leeks MD on 03/06/2010   Method used:   Telephoned to ...       [facility] [phone]* (retail)       [address] [place], [place]  [address]       Ph: [phone] or [phone]       Fax: [phone]   RxID:   [id number]

## 2010-11-26 NOTE — Progress Notes (Signed)
Summary: needs letter for [facility]  Phone Note Call from Patient Call back at Home Phone [phone]   Caller: Patient Call For: Shaune Leeks MD Summary of Call: Patient is asking for a letter for her medicaid stating that she requires more than 20 appts per year due to her BP problems. She forgot to ask you about this at her last appt. Please call patient when ready.  Initial call taken by: Melody Comas,  April 25, 2010 2:47 PM  Follow-up for Phone Call        Written.    Follow-up by: Shaune Leeks MD,  April 25, 2010 4:38 PM  Additional Follow-up for Phone Call Additional follow up Details #1::        Tried calling patient, number was busy, will try again tomorrow am, letter left at front desk.  Tried calling patient again, keep getting busy signal.  Melody Comas  April 26, 2010 8:20 AM  Pt returned Ashley's call....left message w/ Morrie Sheldon.Daine Gip  April 26, 2010 8:31 AM   Additional Follow-up by: Daine Gip,  April 26, 2010 8:31 AM    Additional Follow-up for Phone Call Additional follow up Details #2::    Patient called me back and is asking for the letter to mailed to home. Letter mailed. Follow-up by: Melody Comas,  April 26, 2010 8:37 AM

## 2010-11-28 NOTE — Cardiovascular Report (Signed)
Summary: Office Visit Remote   Office Visit Remote   Imported By: Roderic Ovens 11/08/2010 11:39:40  _____________________________________________________________________  External Attachment:    Type:   Image     Comment:   External Document

## 2010-11-28 NOTE — Letter (Signed)
Summary: Remote Device Check  [facility], Main Office  [address]   [place], [place] [address]   Phone: [phone]  Fax: [phone]     November 06, 2010 MRN: [id number]   Susan Howell [address] [place], [place]  [address]   Dear [name],   Your remote transmission was recieved and reviewed by your physician.  All diagnostics were within normal limits for you.  __X___Your next transmission is scheduled for:   01-23-2011.  Please transmit at any time this day.  If you have a wireless device your transmission will be sent automatically.   Sincerely,  Vella Kohler

## 2010-12-05 ENCOUNTER — Encounter: Payer: Self-pay | Admitting: Cardiovascular Disease

## 2010-12-24 NOTE — Letter (Signed)
Summary: Medical Record Release  Medical Record Release   Imported By: Harlon Flor 12/16/2010 16:20:19  _____________________________________________________________________  External Attachment:    Type:   Image     Comment:   External Document

## 2011-01-06 LAB — URINALYSIS, ROUTINE W REFLEX MICROSCOPIC
Bilirubin Urine: NEGATIVE
Glucose, UA: NEGATIVE mg/dL
Hgb urine dipstick: NEGATIVE
Ketones, ur: NEGATIVE mg/dL
Nitrite: NEGATIVE
Protein, ur: NEGATIVE mg/dL
Specific Gravity, Urine: 1.009 (ref 1.005–1.030)
Urobilinogen, UA: 0.2 mg/dL (ref 0.0–1.0)
pH: 6.5 (ref 5.0–8.0)

## 2011-01-09 LAB — DIFFERENTIAL
Basophils Absolute: 0 10*3/uL (ref 0.0–0.1)
Lymphocytes Relative: 56 % — ABNORMAL HIGH (ref 12–46)
Monocytes Absolute: 0.9 10*3/uL (ref 0.1–1.0)
Monocytes Relative: 12 % (ref 3–12)
Neutro Abs: 2.4 10*3/uL (ref 1.7–7.7)
Neutrophils Relative %: 31 % — ABNORMAL LOW (ref 43–77)

## 2011-01-09 LAB — URINE CULTURE
Colony Count: >=100000
Culture  Setup Time: 201108152250

## 2011-01-09 LAB — URINALYSIS, ROUTINE W REFLEX MICROSCOPIC
Bilirubin Urine: NEGATIVE
Glucose, UA: 100 mg/dL — AB
Hgb urine dipstick: NEGATIVE
Ketones, ur: NEGATIVE mg/dL
Protein, ur: NEGATIVE mg/dL

## 2011-01-09 LAB — COMPREHENSIVE METABOLIC PANEL
Albumin: 3 g/dL — ABNORMAL LOW (ref 3.5–5.2)
BUN: 13 mg/dL (ref 6–23)
Creatinine, Ser: 0.72 mg/dL (ref 0.4–1.2)
Glucose, Bld: 103 mg/dL — ABNORMAL HIGH (ref 70–99)
Total Protein: 6.4 g/dL (ref 6.0–8.3)

## 2011-01-09 LAB — CBC
MCH: 33 pg (ref 26.0–34.0)
MCHC: 33.9 g/dL (ref 30.0–36.0)
MCV: 97.2 fL (ref 78.0–100.0)
Platelets: 331 10*3/uL (ref 150–400)
RDW: 14 % (ref 11.5–15.5)

## 2011-01-11 LAB — BASIC METABOLIC PANEL
Calcium: 8.4 mg/dL (ref 8.4–10.5)
GFR calc Af Amer: >60 mL/min (ref >60)
GFR calc non Af Amer: >60 mL/min (ref >60)
Glucose, Bld: 97 mg/dL (ref 70–99)
Potassium: 4 mEq/L (ref 3.5–5.1)
Sodium: 140 mEq/L (ref 135–145)

## 2011-01-11 LAB — DIFFERENTIAL
Lymphocytes Relative: 54 % — ABNORMAL HIGH (ref 12–46)
Lymphs Abs: 4.4 10*3/uL — ABNORMAL HIGH (ref 0.7–4.0)
Monocytes Absolute: 0.8 10*3/uL (ref 0.1–1.0)
Monocytes Relative: 10 % (ref 3–12)
Neutro Abs: 2.8 10*3/uL (ref 1.7–7.7)
Neutrophils Relative %: 35 % — ABNORMAL LOW (ref 43–77)

## 2011-01-11 LAB — CBC
HCT: 41.2 % (ref 36.0–46.0)
Hemoglobin: 14 g/dL (ref 12.0–15.0)
MCHC: 33.9 g/dL (ref 30.0–36.0)
RBC: 4.16 MIL/uL (ref 3.87–5.11)
WBC: 8.2 10*3/uL (ref 4.0–10.5)

## 2011-01-11 LAB — URINALYSIS, ROUTINE W REFLEX MICROSCOPIC
Bilirubin Urine: NEGATIVE
Hgb urine dipstick: NEGATIVE
Nitrite: NEGATIVE
Specific Gravity, Urine: 1.019 (ref 1.005–1.030)
pH: 6.5 (ref 5.0–8.0)

## 2011-01-11 LAB — HEPATIC FUNCTION PANEL
ALT: 11 U/L (ref 0–35)
AST: 19 U/L (ref 0–37)
Albumin: 3.2 g/dL — ABNORMAL LOW (ref 3.5–5.2)
Alkaline Phosphatase: 62 U/L (ref 39–117)
Bilirubin, Direct: 0.1 mg/dL (ref 0.0–0.3)
Total Bilirubin: 0.8 mg/dL (ref 0.3–1.2)

## 2011-01-11 LAB — PHOSPHORUS: Phosphorus: 3.9 mg/dL (ref 2.3–4.6)

## 2011-01-11 LAB — POCT CARDIAC MARKERS
CKMB, poc: 1.6 ng/mL (ref 1.0–8.0)
Troponin i, poc: <0.05 ng/mL (ref 0.00–0.09)

## 2011-01-12 LAB — POCT CARDIAC MARKERS
CKMB, poc: <1 ng/mL — ABNORMAL LOW (ref 1.0–8.0)
Myoglobin, poc: 144 ng/mL (ref 12–200)
Troponin i, poc: <0.05 ng/mL (ref 0.00–0.09)
Troponin i, poc: <0.05 ng/mL (ref 0.00–0.09)

## 2011-01-12 LAB — URINALYSIS, ROUTINE W REFLEX MICROSCOPIC
Bilirubin Urine: NEGATIVE
Ketones, ur: NEGATIVE mg/dL
Nitrite: NEGATIVE
Urobilinogen, UA: 1 mg/dL (ref 0.0–1.0)

## 2011-01-12 LAB — POCT I-STAT, CHEM 8
Calcium, Ion: 1.03 mmol/L — ABNORMAL LOW (ref 1.12–1.32)
Chloride: 107 mEq/L (ref 96–112)
HCT: 48 % — ABNORMAL HIGH (ref 36.0–46.0)
Potassium: 3.9 mEq/L (ref 3.5–5.1)
Sodium: 138 mEq/L (ref 135–145)

## 2011-01-12 LAB — DIFFERENTIAL
Basophils Absolute: 0 10*3/uL (ref 0.0–0.1)
Basophils Absolute: 0.1 10*3/uL (ref 0.0–0.1)
Basophils Relative: 1 % (ref 0–1)
Lymphocytes Relative: 50 % — ABNORMAL HIGH (ref 12–46)
Lymphs Abs: 4.3 10*3/uL — ABNORMAL HIGH (ref 0.7–4.0)
Monocytes Absolute: 0.6 10*3/uL (ref 0.1–1.0)
Monocytes Absolute: 1.2 10*3/uL — ABNORMAL HIGH (ref 0.1–1.0)
Neutro Abs: 3.7 10*3/uL (ref 1.7–7.7)
Neutro Abs: 1.5 10*3/uL — ABNORMAL LOW (ref 1.7–7.7)
Neutrophils Relative %: 24 % — ABNORMAL LOW (ref 43–77)

## 2011-01-12 LAB — CBC
HCT: 41.1 % (ref 36.0–46.0)
Hemoglobin: 14 g/dL (ref 12.0–15.0)
MCHC: 33.3 g/dL (ref 30.0–36.0)
Platelets: 360 10*3/uL (ref 150–400)
Platelets: 257 10*3/uL (ref 150–400)
RDW: 14.9 % (ref 11.5–15.5)
RDW: 14.9 % (ref 11.5–15.5)
WBC: 8.7 10*3/uL (ref 4.0–10.5)

## 2011-01-12 LAB — BASIC METABOLIC PANEL
Calcium: 9.1 mg/dL (ref 8.4–10.5)
GFR calc non Af Amer: >60 mL/min (ref >60)
Glucose, Bld: 153 mg/dL — ABNORMAL HIGH (ref 70–99)
Potassium: 3.9 mEq/L (ref 3.5–5.1)
Sodium: 138 mEq/L (ref 135–145)

## 2011-01-12 LAB — BRAIN NATRIURETIC PEPTIDE: Pro B Natriuretic peptide (BNP): 178 pg/mL — ABNORMAL HIGH (ref 0.0–100.0)

## 2011-01-23 ENCOUNTER — Encounter (INDEPENDENT_AMBULATORY_CARE_PROVIDER_SITE_OTHER): Payer: Medicaid Other | Admitting: *Deleted

## 2011-01-23 DIAGNOSIS — R0989 Other specified symptoms and signs involving the circulatory and respiratory systems: Secondary | ICD-10-CM

## 2011-01-26 ENCOUNTER — Encounter: Payer: Self-pay | Admitting: *Deleted

## 2011-01-28 ENCOUNTER — Emergency Department (HOSPITAL_COMMUNITY): Payer: Medicaid Other

## 2011-01-28 ENCOUNTER — Other Ambulatory Visit: Payer: Self-pay | Admitting: Neurosurgery

## 2011-01-28 ENCOUNTER — Observation Stay (HOSPITAL_COMMUNITY)
Admission: EM | Admit: 2011-01-28 | Discharge: 2011-01-29 | Disposition: A | Discharge status: Home / Self Care | Payer: Medicaid Other | Attending: Internal Medicine | Admitting: Internal Medicine

## 2011-01-28 DIAGNOSIS — N3010 Interstitial cystitis (chronic) without hematuria: Secondary | ICD-10-CM | POA: Insufficient documentation

## 2011-01-28 DIAGNOSIS — Z950 Presence of cardiac pacemaker: Secondary | ICD-10-CM | POA: Insufficient documentation

## 2011-01-28 DIAGNOSIS — M545 Low back pain, unspecified: Secondary | ICD-10-CM

## 2011-01-28 DIAGNOSIS — E669 Obesity, unspecified: Secondary | ICD-10-CM | POA: Insufficient documentation

## 2011-01-28 DIAGNOSIS — M549 Dorsalgia, unspecified: Secondary | ICD-10-CM

## 2011-01-28 DIAGNOSIS — H6690 Otitis media, unspecified, unspecified ear: Secondary | ICD-10-CM | POA: Insufficient documentation

## 2011-01-28 DIAGNOSIS — R0602 Shortness of breath: Secondary | ICD-10-CM | POA: Insufficient documentation

## 2011-01-28 DIAGNOSIS — I10 Essential (primary) hypertension: Secondary | ICD-10-CM | POA: Insufficient documentation

## 2011-01-28 DIAGNOSIS — R079 Chest pain, unspecified: Principal | ICD-10-CM | POA: Insufficient documentation

## 2011-01-28 DIAGNOSIS — F411 Generalized anxiety disorder: Secondary | ICD-10-CM | POA: Insufficient documentation

## 2011-01-28 DIAGNOSIS — F3289 Other specified depressive episodes: Secondary | ICD-10-CM | POA: Insufficient documentation

## 2011-01-28 DIAGNOSIS — A498 Other bacterial infections of unspecified site: Secondary | ICD-10-CM | POA: Insufficient documentation

## 2011-01-28 DIAGNOSIS — F329 Major depressive disorder, single episode, unspecified: Secondary | ICD-10-CM | POA: Insufficient documentation

## 2011-01-28 LAB — HEPATIC FUNCTION PANEL
ALT: 13 U/L (ref 0–35)
AST: 19 U/L (ref 0–37)
Albumin: 3.2 g/dL — ABNORMAL LOW (ref 3.5–5.2)
Alkaline Phosphatase: 54 U/L (ref 39–117)
Bilirubin, Direct: 0.1 mg/dL (ref 0.0–0.3)
Indirect Bilirubin: 0.7 mg/dL (ref 0.3–0.9)
Total Bilirubin: 0.8 mg/dL (ref 0.3–1.2)
Total Protein: 6.9 g/dL (ref 6.0–8.3)

## 2011-01-28 LAB — URINALYSIS, ROUTINE W REFLEX MICROSCOPIC
Protein, ur: NEGATIVE mg/dL
Urobilinogen, UA: 0.2 mg/dL (ref 0.0–1.0)

## 2011-01-28 LAB — CBC
HCT: 42.8 % (ref 36.0–46.0)
MCV: 93.4 fL (ref 78.0–100.0)
RDW: 14 % (ref 11.5–15.5)
WBC: 8.9 10*3/uL (ref 4.0–10.5)

## 2011-01-28 LAB — POCT I-STAT, CHEM 8
BUN: 15 mg/dL (ref 6–23)
Chloride: 103 mEq/L (ref 96–112)
Creatinine, Ser: 1 mg/dL (ref 0.4–1.2)
Glucose, Bld: 91 mg/dL (ref 70–99)
HCT: 46 % (ref 36.0–46.0)
Potassium: 3.5 mEq/L (ref 3.5–5.1)

## 2011-01-28 LAB — POCT CARDIAC MARKERS
CKMB, poc: <1 ng/mL — ABNORMAL LOW (ref 1.0–8.0)
Troponin i, poc: <0.05 ng/mL (ref 0.00–0.09)
Troponin i, poc: <0.05 ng/mL (ref 0.00–0.09)

## 2011-01-28 LAB — DIFFERENTIAL
Eosinophils Relative: 0 % (ref 0–5)
Lymphocytes Relative: 55 % — ABNORMAL HIGH (ref 12–46)
Lymphs Abs: 4.9 10*3/uL — ABNORMAL HIGH (ref 0.7–4.0)
Monocytes Relative: 11 % (ref 3–12)

## 2011-01-28 LAB — LIPASE, BLOOD: Lipase: 16 U/L (ref 11–59)

## 2011-01-28 LAB — URINE MICROSCOPIC-ADD ON

## 2011-01-28 MED ORDER — TECHNETIUM TO 99M ALBUMIN AGGREGATED
6.0000 | Freq: Once | INTRAVENOUS | Status: AC | PRN
  Administered 2011-01-28: 6 via INTRAVENOUS

## 2011-01-28 MED ORDER — XENON XE 133 GAS
10.0000 | GAS_FOR_INHALATION | Freq: Once | RESPIRATORY_TRACT | Status: AC | PRN
  Administered 2011-01-28: 10 via RESPIRATORY_TRACT

## 2011-01-29 LAB — RAPID URINE DRUG SCREEN, HOSP PERFORMED
Barbiturates: NOT DETECTED
Opiates: NOT DETECTED
Tetrahydrocannabinol: NOT DETECTED

## 2011-01-29 LAB — CARDIAC PANEL(CRET KIN+CKTOT+MB+TROPI)
CK, MB: 1.2 ng/mL (ref 0.3–4.0)
Relative Index: 1 (ref 0.0–2.5)
Total CK: 133 U/L (ref 7–177)
Troponin I: 0.02 ng/mL (ref 0.00–0.06)
Troponin I: 0.01 ng/mL (ref 0.00–0.06)

## 2011-01-29 LAB — CK TOTAL AND CKMB (NOT AT ARMC)
CK, MB: 1.1 ng/mL (ref 0.3–4.0)
Relative Index: 1.1 (ref 0.0–2.5)

## 2011-01-29 LAB — CBC
Hemoglobin: 14.3 g/dL (ref 12.0–15.0)
MCH: 31.4 pg (ref 26.0–34.0)
MCV: 93.2 fL (ref 78.0–100.0)
RBC: 4.55 MIL/uL (ref 3.87–5.11)

## 2011-01-29 LAB — COMPREHENSIVE METABOLIC PANEL
AST: 29 U/L (ref 0–37)
Albumin: 3.2 g/dL — ABNORMAL LOW (ref 3.5–5.2)
Chloride: 102 mEq/L (ref 96–112)
Creatinine, Ser: 0.94 mg/dL (ref 0.4–1.2)
GFR calc Af Amer: >60 mL/min (ref >60)
Total Bilirubin: 1.1 mg/dL (ref 0.3–1.2)
Total Protein: 7 g/dL (ref 6.0–8.3)

## 2011-01-29 LAB — TSH: TSH: 1.853 u[IU]/mL (ref 0.350–4.500)

## 2011-01-29 LAB — T3, FREE: T3, Free: 2.5 pg/mL (ref 2.3–4.2)

## 2011-01-29 LAB — T4, FREE: Free T4: 1.1 ng/dL (ref 0.80–1.80)

## 2011-01-30 ENCOUNTER — Telehealth: Payer: Self-pay | Admitting: Internal Medicine

## 2011-01-30 LAB — COMPREHENSIVE METABOLIC PANEL
AST: 22 U/L (ref 0–37)
CO2: 27 mEq/L (ref 19–32)
Chloride: 104 mEq/L (ref 96–112)
Creatinine, Ser: 0.98 mg/dL (ref 0.4–1.2)
GFR calc Af Amer: >60 mL/min (ref >60)
GFR calc non Af Amer: 59 mL/min — ABNORMAL LOW (ref >60)
Glucose, Bld: 99 mg/dL (ref 70–99)
Total Bilirubin: 0.6 mg/dL (ref 0.3–1.2)

## 2011-01-30 LAB — URINALYSIS, ROUTINE W REFLEX MICROSCOPIC
Bilirubin Urine: NEGATIVE
Glucose, UA: NEGATIVE mg/dL
Ketones, ur: NEGATIVE mg/dL
Ketones, ur: NEGATIVE mg/dL
Protein, ur: NEGATIVE mg/dL
Protein, ur: 30 mg/dL — AB
Urobilinogen, UA: 0.2 mg/dL (ref 0.0–1.0)
Urobilinogen, UA: 0.2 mg/dL (ref 0.0–1.0)

## 2011-01-30 LAB — DIFFERENTIAL
Basophils Absolute: 0 10*3/uL (ref 0.0–0.1)
Basophils Absolute: 0 10*3/uL (ref 0.0–0.1)
Basophils Relative: 0 % (ref 0–1)
Eosinophils Absolute: 0.1 10*3/uL (ref 0.0–0.7)
Eosinophils Absolute: 0.1 10*3/uL (ref 0.0–0.7)
Eosinophils Relative: 1 % (ref 0–5)
Lymphocytes Relative: 44 % (ref 12–46)
Monocytes Absolute: 0.9 10*3/uL (ref 0.1–1.0)
Monocytes Relative: 12 % (ref 3–12)
Neutrophils Relative %: 31 % — ABNORMAL LOW (ref 43–77)

## 2011-01-30 LAB — BASIC METABOLIC PANEL
CO2: 28 mEq/L (ref 19–32)
Chloride: 107 mEq/L (ref 96–112)
Creatinine, Ser: 0.84 mg/dL (ref 0.4–1.2)
GFR calc Af Amer: >60 mL/min (ref >60)
Glucose, Bld: 82 mg/dL (ref 70–99)

## 2011-01-30 LAB — CBC
HCT: 37.9 % (ref 36.0–46.0)
HCT: 41.8 % (ref 36.0–46.0)
Hemoglobin: 14.4 g/dL (ref 12.0–15.0)
Hemoglobin: 13.9 g/dL (ref 12.0–15.0)
MCHC: 33.3 g/dL (ref 30.0–36.0)
MCV: 97.5 fL (ref 78.0–100.0)
MCV: 97.4 fL (ref 78.0–100.0)
Platelets: 304 10*3/uL (ref 150–400)
RBC: 4.43 MIL/uL (ref 3.87–5.11)
RBC: 4.29 MIL/uL (ref 3.87–5.11)
RDW: 14.3 % (ref 11.5–15.5)
WBC: 7.7 10*3/uL (ref 4.0–10.5)
WBC: 8.8 10*3/uL (ref 4.0–10.5)

## 2011-01-30 LAB — URINE CULTURE: Colony Count: >=100000

## 2011-01-30 LAB — POCT CARDIAC MARKERS
CKMB, poc: 1.9 ng/mL (ref 1.0–8.0)
CKMB, poc: 2 ng/mL (ref 1.0–8.0)
Myoglobin, poc: 111 ng/mL (ref 12–200)
Troponin i, poc: <0.05 ng/mL (ref 0.00–0.09)
Troponin i, poc: <0.05 ng/mL (ref 0.00–0.09)

## 2011-01-30 LAB — URINE MICROSCOPIC-ADD ON

## 2011-01-30 NOTE — Telephone Encounter (Signed)
Pt was kept overnight last night at [facility] for observation.  Pt was weak and c/o a pinching feeling in her chest.  Pt was released and is not clear as to whether anything was found to be abnormal.  Please review her d/c from [facility] and advise the pt.  Would like to see Graciela Husbands today.  Graciela Husbands is not in [facility] until 4/24.

## 2011-01-30 NOTE — Telephone Encounter (Signed)
Spoke to pt, reviewed discharge summary with pt from [facility].  Pt went into much detail about her care at ER, and her urology history. Pt is asymptomatic at this time. Regarding UTI and h/o IC pt was advised to f/u with urologist. Pt is going to call [place] for f/u with Dr. Graciela Husbands.

## 2011-01-30 NOTE — Telephone Encounter (Signed)
Pt states she has been d/c from [facility] on 01/29/11. Pt was told she needs to be seen next week. Pt wants to talk to a nurse.

## 2011-01-30 NOTE — Telephone Encounter (Signed)
Pt. Was discharged from [facility] hospital. She states needs a f/u  appointment  With Dr. Graciela Husbands or Dr. Ladona Ridgel  for pacer check. An appointment was made for pt. For May 3rd at 3:30 PM with Dr. Ladona Ridgel. Pt aware.

## 2011-01-31 LAB — URINE MICROSCOPIC-ADD ON

## 2011-01-31 LAB — URINALYSIS, ROUTINE W REFLEX MICROSCOPIC
Glucose, UA: NEGATIVE mg/dL
Ketones, ur: NEGATIVE mg/dL

## 2011-01-31 LAB — CBC
HCT: 40.3 % (ref 36.0–46.0)
Hemoglobin: 13.9 g/dL (ref 12.0–15.0)
MCV: 98.5 fL (ref 78.0–100.0)
RBC: 4.1 MIL/uL (ref 3.87–5.11)
WBC: 10.8 10*3/uL — ABNORMAL HIGH (ref 4.0–10.5)

## 2011-01-31 LAB — URINE CULTURE

## 2011-01-31 LAB — BASIC METABOLIC PANEL
Chloride: 106 mEq/L (ref 96–112)
GFR calc Af Amer: >60 mL/min (ref >60)
Potassium: 3.6 mEq/L (ref 3.5–5.1)

## 2011-01-31 LAB — DIFFERENTIAL
Eosinophils Absolute: 0.1 10*3/uL (ref 0.0–0.7)
Eosinophils Relative: 1 % (ref 0–5)
Lymphs Abs: 5.1 10*3/uL — ABNORMAL HIGH (ref 0.7–4.0)
Monocytes Relative: 12 % (ref 3–12)

## 2011-01-31 NOTE — H&P (Addendum)
Susan Howell, Susan Howell               ACCOUNT NO.:  [id number]  MEDICAL RECORD NO.:  [id number]           PATIENT TYPE:  I  LOCATION:  3729                         FACILITY:  [facility]  PHYSICIAN:  Pleas Koch, MD        DATE OF BIRTH:  [birth year]  DATE OF ADMISSION:  01/28/2011 DATE OF DISCHARGE:                             HISTORY & PHYSICAL   DISCHARGE DIAGNOSES: 1. Chest pain, shortness of breath. 2. Chronic pain. 3. Anxiety. 4. Depression. 5. Chronic urinary tract infection. 6. Obesity. 7. History of sinus node dysfunction status post placement of     Medtronic pacemaker by Dr. Lewayne Bunting in 2009.  Briefly, this is a 55 year old female with hypertension, obesity, interstitial cystitis, chronic low back pain, pacemaker for bradycardia with recent chest pain over the past 3 days which was retrosternal, had some chest tightness, shortness of breath, palpitations, which recurred on day of admission.  She went to Dr. Wynetta Emery of Neurosurgery for low back pain with increasing numbness and tingling and he advised her to go to the ED.  D-dimer was mildly elevated and V/Q scan showed low probability for PE.  She was also found to have a possible UTI.  Please see full dictation by Dr. Toniann Fail job number [id number].  HOSPITAL COURSE: 1. Chest pain.  The patient ruled out by cardiac enzymes x3 for chest     pain.  I did have a [employer] pacing rep come upstairs to look at     the EKG as I did not see pacer spikes and he said that this is a     demand pacemaker and that first-degree AV block is what would be     expected in a patient with this type of pacemaker.  As the patient     is not having further chest pain, we will discharge the patient     home to the care of Dr. Ladona Ridgel and Dr. Graciela Husbands. 2. Chronic pain. 3. UTI.  The patient grew 100,000 colony-forming units of E. coli and     will likely need to be on suppressive therapy for the same.  I am     discharging her on Bactrim DS as the  patient does not have any     fever and does not have any real white count as this may be     asymptomatic bacteriuria.  This can be followed up as an     outpatient. 4. Hypertension.  Blood pressure is relatively well controlled.  Blood     pressure is 119-153 on 78-98.  The patient will need to continue     her home meds of [place] and other medications on discharge that     she was not getting at discharge. 5. Anxiety/depression.  The patient has been seen in the past by     Psychiatry and it may be reasonable for this to continue as an     outpatient.  She will need to continue her Xanax and other     medications as dictated in     medication history. 6. Chronic pain.  The patient  is on Soma and is going to go and see a     pain management specialist and I have encouraged to do so to     discontinue all of her medications or to get alternative treatments     for the same.          ______________________________ Pleas Koch, MD     JS/MEDQ  D:  01/29/2011  T:  01/29/2011  Job:  [id number]  cc:   Doylene Canning. Ladona Ridgel, MD Duke Salvia, MD, [facility]  Electronically Signed by Pleas Koch MD on 01/31/2011 04:53:35 AM

## 2011-01-31 NOTE — Discharge Summary (Signed)
  NAMEANI, DEOLIVEIRA               ACCOUNT NO.:  [id number]  MEDICAL RECORD NO.:  [id number]           PATIENT TYPE:  I  LOCATION:  3729                         FACILITY:  [facility]  PHYSICIAN:  Pleas Koch, MD        DATE OF BIRTH:  April 15, 1956  DATE OF ADMISSION:  01/28/2011 DATE OF DISCHARGE:                              DISCHARGE SUMMARY   ADDENDUM  Addendum to dictation [id number].  DISCHARGE MEDICATIONS: 1. Topamax 25 mg 1 tablet at bedtime. 2. Trazodone 100 mg 1 tablet at bedtime. 3. Promethazine 25 mg 1 tablet q.6 h. p.r.n. 4. Xanax 2 mg 0.25-0.5 mg t.i.d. as per instructions by regular     physician. 5. Clonidine 0.2 mg 1 tablet t.i.d. 6. Soma 350 mg 1 tablet at bedtime. 7. Artificial tears 2 drops both eyes daily. 8. Aspirin 81 mg 1 tablet daily. 9. Chlorthalidone 25 mg, 0.5 mg daily. 10.The patient is to take Bactrim double-strength 1 tablet b.i.d. to complete course    of medication         ______________________________Jai Mahala Menghini, MD     JS/MEDQ  D:  01/29/2011  T:  01/29/2011  Job:  [id number]  Electronically Signed by Pleas Koch MD on 01/31/2011 04:54:30 AM

## 2011-02-03 ENCOUNTER — Ambulatory Visit (INDEPENDENT_AMBULATORY_CARE_PROVIDER_SITE_OTHER): Payer: Medicaid Other | Admitting: *Deleted

## 2011-02-03 ENCOUNTER — Other Ambulatory Visit: Payer: Self-pay

## 2011-02-03 DIAGNOSIS — I495 Sick sinus syndrome: Secondary | ICD-10-CM

## 2011-02-03 DIAGNOSIS — R0989 Other specified symptoms and signs involving the circulatory and respiratory systems: Secondary | ICD-10-CM

## 2011-02-03 DIAGNOSIS — Z950 Presence of cardiac pacemaker: Secondary | ICD-10-CM

## 2011-02-03 NOTE — Discharge Summary (Signed)
  NAMERUSSELL, ENGELSTAD               ACCOUNT NO.:  [id number]  MEDICAL RECORD NO.:  [id number]           PATIENT TYPE:  I  LOCATION:  3729                         FACILITY:  [facility]  PHYSICIAN:  Pleas Koch, MD        DATE OF BIRTH:  Jan 31, 1956  DATE OF ADMISSION:  01/28/2011 DATE OF DISCHARGE:  01/29/2011                              DISCHARGE SUMMARY   The patient's urine culture came back showing 100,000 colonies of E. coli, resistant to ampicillin, cefazolin, Cipro, gentamicin, levofloxacin, Bactrim and sensitive to nitrofurantoin, and Tobramycin. The patient called me back requesting that I placed her on something different as she states that she has had UTIs that had been resistant to Bactrim in the past.  She went on to state this is soemthing she is followed up for especially in [facility] for.     I made the necessary correction, then call her in a 7-day course of Macrodantin 100 mg daily as well as Pyridium 200 mg t.i.d. for 2-3 days.  The patient is instructed to followup with a primary care physician and be reviewed at that point in time.          ______________________________ Pleas Koch, MD     JS/MEDQ  D:  02/01/2011  T:  02/01/2011  Job:  [id number]  Electronically Signed by Pleas Koch MD on 02/03/2011 06:10:49 AM

## 2011-02-04 NOTE — Progress Notes (Signed)
Pacer remote check  

## 2011-02-07 NOTE — Telephone Encounter (Signed)
Pt has question re device and her heart rate. Pt cell #[phone]

## 2011-02-07 NOTE — Telephone Encounter (Signed)
CONT TO C/O ELEVATED HR AS WELL AS DIZZINESS DEVICE WAS JUST CHECKED ON 02/03/11  NOTHING ABN NOTED ON CHECK  INSTRUCTED TO CONT TO MONITOR IF S/S INCREASE CALL OFFICE BACK VERBALIZED UNDERSTANDING. HAS APPT WITH DR Ladona Ridgel ON 02/27/11 Zack Seal

## 2011-02-20 ENCOUNTER — Ambulatory Visit
Admission: RE | Admit: 2011-02-20 | Discharge: 2011-02-20 | Disposition: A | Discharge status: Home / Self Care | Payer: Medicaid Other | Source: Ambulatory Visit | Attending: Neurosurgery | Admitting: Neurosurgery

## 2011-02-20 DIAGNOSIS — M549 Dorsalgia, unspecified: Secondary | ICD-10-CM

## 2011-02-20 DIAGNOSIS — M545 Low back pain, unspecified: Secondary | ICD-10-CM

## 2011-02-26 ENCOUNTER — Encounter: Payer: Self-pay | Admitting: *Deleted

## 2011-02-26 ENCOUNTER — Encounter: Payer: Self-pay | Admitting: Internal Medicine

## 2011-02-27 ENCOUNTER — Encounter: Payer: Self-pay | Admitting: Internal Medicine

## 2011-02-27 ENCOUNTER — Ambulatory Visit (INDEPENDENT_AMBULATORY_CARE_PROVIDER_SITE_OTHER): Payer: Medicaid Other | Admitting: Internal Medicine

## 2011-02-27 DIAGNOSIS — Z950 Presence of cardiac pacemaker: Secondary | ICD-10-CM

## 2011-02-27 DIAGNOSIS — I10 Essential (primary) hypertension: Secondary | ICD-10-CM

## 2011-02-27 DIAGNOSIS — I495 Sick sinus syndrome: Secondary | ICD-10-CM

## 2011-02-27 NOTE — Progress Notes (Signed)
HPI [name] Returns today for followup. She is a 55 year old woman with a history of symptomatic bradycardia, hypertension, chronic urinary tract infections, and chronic pain. Most of her pain is related to musculoskeletal back pain. She denies syncope. Her appetite remains good. Allergies  Allergen Reactions  . Aliskiren Fumarate     REACTION: ANXIETY // SYNCOPE  . Codeine   . Eszopiclone     REACTION: NIGHT MARES  . Iohexol      Desc: NAUSEA,VOMITING & SZ S/P CONTRAST INJ 30 YRS AGO// OK W/ PRE MEDS, BENADRYL & SOLUMEDROL TODAY//A.C., Onset Date: [id number]   . Labetalol Hcl   . Penicillins   . Vancomycin      Current Outpatient Prescriptions  Medication Sig Dispense Refill  . alprazolam (XANAX) 2 MG tablet Take 2 mg by mouth. Take 1/2 tablet twice daily      . aspirin 81 MG tablet Take 81 mg by mouth daily.        . carisoprodol (SOMA) 350 MG tablet Take 350 mg by mouth at bedtime as needed. As directed, then discontinue      . cloNIDine (CATAPRES) 0.2 MG tablet Take 0.2 mg by mouth 3 (three) times daily. As directed      . promethazine (PHENERGAN) 25 MG tablet Take 25 mg by mouth every 6 (six) hours as needed.        . traZODone (DESYREL) 100 MG tablet Take 50 mg by mouth at bedtime.       . chlorthalidone (HYGROTON) 25 MG tablet Take 25 mg by mouth daily.        Marland Kitchen DISCONTD: topiramate (TOPAMAX) 25 MG capsule Take 25 mg by mouth 2 (two) times daily.           Past Medical History  Diagnosis Date  . Chest pain   . Anxiety   . Depression   . UTI (urinary tract infection)   . Obesity   . Sinus node dysfunction      status post placement of    Medtronic pacemaker by Dr. Lewayne Bunting in 2009.   Marland Kitchen HTN (hypertension)   . Obesity   . Cystitis   . Palpitations     ROS:   All systems reviewed and negative except as noted in the HPI.   Past Surgical History  Procedure Date  . Pacemaker placement   . Appendectomy      No family history on file.   History    Social History  . Marital Status: Married    Spouse Name: N/A    Number of Children: N/A  . Years of Education: N/A   Occupational History  . Not on file.   Social History Main Topics  . Smoking status: Never Smoker   . Smokeless tobacco: Not on file  . Alcohol Use: Not on file  . Drug Use: Not on file  . Sexually Active: Not on file   Other Topics Concern  . Not on file   Social History Narrative  . No narrative on file     BP 132/80  Pulse 64  Ht 5' (1.524 m)  Wt 245 lb (111.131 kg)  BMI 47.85 kg/m2  Physical Exam:  Obese, diskempt appearing NAD HEENT: Unremarkable Neck:  No JVD, no thyromegally Lymphatics:  No adenopathy Back:  No CVA tenderness Lungs:  Clear. Well-healed pacemaker incision HEART:  Regular rate rhythm, no murmurs, no rubs, no clicks Abd:  Flat, positive bowel sounds, no organomegally, no rebound, no guarding Ext:  2 plus pulses, no edema, no cyanosis, no clubbing Skin:  No rashes no nodules Neuro:  CN II through XII intact, motor grossly intact DEVICE  Normal device function.  See PaceArt for details.   Assess/Plan:

## 2011-02-27 NOTE — Assessment & Plan Note (Signed)
Her device is working normally. We'll recheck in several months. 

## 2011-03-08 NOTE — H&P (Signed)
NAMESAHRA, CONVERSE               ACCOUNT NO.:  [id number]  MEDICAL RECORD NO.:  [id number]           PATIENT TYPE:  E  LOCATION:  [facility]                         FACILITY:  [facility]  PHYSICIAN:  Eduard Clos, MDDATE OF BIRTH:  13-Sep-1956  DATE OF ADMISSION:  01/28/2011 DATE OF DISCHARGE:                             HISTORY & PHYSICAL   PRIMARY CARE PHYSICIAN:  Unassigned at this time.  The patient needs to go to Dr. Hetty Ely at [place] but she is changing to [facility].  CHIEF COMPLAINT:  Chest pain and shortness of breath.  HISTORY OF PRESENT ILLNESS:  A 54 year old female with history of hypertension, obesity, has had interstitial cystitis, chronic low-back pain and pacemaker for bradycardia, has experienced some chest pain over the last 3 days.  Initially she had chest pain when she was in the church which lasted for half minute, it was a retrosternal along with some chest tightness and shortness of breath with palpitations, and again she had these symptoms yesterday and today also in the morning. The chest pain is retrosternal and nonradiating.  She had gone to Dr. Wynetta Emery of neurosurgery for her low back pain with increasing tingling and numbness.  Dr. Wynetta Emery has advised to go to ER regarding this patient had some chest pain.  In the ER, the patient had cardiac enzymes which were negative.  Her D-dimer was mildly elevated for which VQ scan has been done which is negative and low probability for pulmonary embolism.  At this time the patient has been admitted for further workup for chest pain and shortness of breath.  In addition, the patient also has been having some low back pain mostly in the left and right flank which at this time she is pain free.  In addition, the patient is also having dysuria.  UA is showing features of UTI.  The patient has been started on Bactrim, will be getting urine cultures.  The patient states she has been getting off and on palpitations  which lasts for less than a minute and has been going on for the last 3 days more than usual.  The patient denies any vomiting.  Does have chronic nausea.  Denies any abdominal pain except for the flank pain.  Denies any diarrhea.  Denies any headache or visual symptoms, any focal deficits.  PAST MEDICAL HISTORY:  History of bradycardia status post pacemaker placement, hypertension, obesity, history of orthostatic hypotension, history of chronic low back pain, history of bipolar disorder, off medications and history of anxiety.  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  MEDICATIONS PRIOR TO ADMISSION: 1. Clonidine 0.2 mg 1 tablet 3 times daily. 2. Xanax 2 mg tablets 3 times daily. 3. Trazodone 100 mg at bedtime. 4. Artificial tears. 5. Soma 350 mg p.o. daily at bedtime. 6. Promethazine. 7. Aspirin.  ALLERGIES:  CODEINE, IODINE, TETANUS TOXOID, PENICILLIN, ANTIHISTAMINES, SMALL POX VACCINE, VANCOMYCIN, VALSARTAN.  FAMILY HISTORY:  Significant for her twin brother  having stroke and heart attack at very young age around 34.  The patient's family is also having history of diabetes.  SOCIAL HISTORY:  The patient does not smoke cigarette, drink alcohol or use illegal drugs.  Lives with her husband.  REVIEW OF SYSTEMS:  As per in the history of present illness nothing else significant.  PHYSICAL EXAMINATION:  GENERAL:  The patient was examined at bedside not in acute distress. VITAL SIGNS:  Blood pressure is 124/90, pulse of 86 per minute, temperature 98.2, respirations 18 per minute, O2 sat 97%.  HEENT: Anicteric.  No pallor.  No discharge from ears, eyes, nose or mouth. CHEST:  Bilateral air entry present.  No rhonchi.  No crepitation. HEART:  S1 and S2 is heard. ABDOMEN:  Soft,  nontender.  Bowel sounds heard. CNS:  Alert, awake and oriented to time, place and person.  Moves upper and lower extremities 5/5. EXTREMITIES:  Peripheral pulses are felt.  No edema.  LABORATORY FINDINGS:  EKG shows sinus rhythm with first-degree AV block, right bundle branch block with nonspecific ST-T changes.  Chest x-ray shows no acute cardiopulmonary abnormality.  VQ scan shows very low probability for acute pulmonary embolism.  CBC; WBC 8.8, hemoglobin 15.6, hematocrit is 46, platelets 343.  Basic metabolic panel; sodium 139, potassium 3.5, chloride 103, glucose 91, BUN 15, creatinine 1, total bilirubin is 0.8, direct is 0.1 and indirect is 0.7, alk phos is 54, AST 19, ALT 13, total protein 6.9, albumin 3.2, lipase 16, CK-MB less than 1 x2, troponin less than 0.05 x2.  UA shows positive nitrites, leukocytes and wbc 11-20, bacteria many.  ASSESSMENT: 1. Chest pain with shortness of breath, we will have to rule out acute     coronary syndrome. 2. Possible urinary tract infection with history of interstitial     cystitis. 3. History of obstructive sleep apnea, noncompliant with CPAP. 4. Palpitations. 5. Hypertension. 6. Obesity. 7. History of multiple surgeries. 8. History of chronic low back pain. 9. History of bipolar disorder, has no suicidal intention.  She is not     on any medications for her pain. 10.History of anxiety.  PLAN: 1. At this time we will admit the patient to telemetry. 2. For chest pain, at this time the patient is chest pain free.  We     will place the patient on aspirin, nitroglycerin p.r.n., cycle     cardiac markers, get 2-D echo. 3. History of bradycardia status post pacemaker placement.  We will     continue to monitor in telemetry. 4. UTI.  We will place the patient on Bactrim, get urine cultures. 5. Right and left flank pain and low back pain.  If this worsens, may     need to stand up.  At this time the patient is not having any      significant pain. 6. Hypertension.  We will continue her present medication, clonidine. 7. Palpitation.  We are going to check a stat TSH, free T3, and Free     T4. 8. Further recommendation based on test ordered including the course.     Eduard Clos, MD     ANK/MEDQ  D:  01/28/2011  T:  01/29/2011  Job:  [id number]  Electronically Signed by Midge Minium MD on 03/08/2011 07:46:46 PM

## 2011-03-11 NOTE — Letter (Signed)
December 14, 2007    Alan Mulder, M.D.  [address]  [place], [place] [address]   RE:  [name], [name]  MRN:  [id number]  /  DOB:  26-Jul-1956   Dear Dr. Patrecia Pace:   [name] is seen today following her pacemaker generator replacement  last week.  I guess you had seen her earlier today, and there has been  issues about labile blood pressure, you had noted that the clonidine  seemed to be having excessive affect, and I would concur with you on  that.  We have discontinued it.   Other medications include:  1. Metoprolol 12.5.  2. Soma.  3. Xanax.  4. Trazodone.  5. Amitriptyline.   EXAMINATION:  Her blood pressure today was 118/120, though her pulse was  75.  LUNGS:  Were clear.  HEART:  Sounds were regular.  EXTREMITIES:  Without edema.   Interrogation of her pacemaker demonstrated normal function.  As would  be anticipated, the leads were chronic.   I have asked that she follow up with you in the next couple of weeks,  and Dr. Hetty Ely as well about her blood pressure, realizing also that  the difficulties that we are going to have with her labile hypertension  and hypotension, it is going to be very difficult strait to navigate.   Please let me know if there is anything further I can do.    Sincerely,      Duke Salvia, MD, [facility]  Electronically Signed    SCK/MedQ  DD: 12/14/2007  DT: 12/15/2007  Job #: [id number]   CC:    Arta Silence, MD

## 2011-03-11 NOTE — Assessment & Plan Note (Signed)
[facility]                                 ON-CALL NOTE   FRAYDA, EGLEY                        MRN:          [id number]  DATE:12/24/2007                            DOB:          May 04, 1956    DATE OF CALL:  December 24, 2007.   TIME OF CALL:  8:01 p.m.   PHONE NUMBER:  [phone].   SUBJECTIVE:  The patient has temperature in the 101 range, which has not  alleviated. She was started on Zithromax from yesterday afternoon for  presumed bronchitis. She is still coughing a lot with yellow sputum and  the fever continues to stay up. She gets light headed when she gets up.  Says that she is drinking plenty of fluids and is urinating okay.   OBJECTIVE:  Presumed bronchitis. May turn into pneumonia if she is not  feeling better by tomorrow afternoon. I do not expect her to be normal  but should be improved. If not, probably ought to go in to be seen and  get a chest x-ray.   PRIMARY CARE PHYSICIAN:  Laurita Quint, M.D., [facility].     Arta Silence, MD  Electronically Signed    RNS/MedQ  DD: 12/24/2007  DT: 12/26/2007  Job #: [id number]   cc:   Arta Silence, MD

## 2011-03-11 NOTE — Assessment & Plan Note (Signed)
[facility]                         ELECTROPHYSIOLOGY OFFICE NOTE   TIAJAH, OYSTER                      MRN:          [id number]  DATE:07/29/2007                            DOB:          09/27/1956    Lorelee Market came in on her birthday.  She has a pacemaker implanted  for sinus node dysfunction.  She is doing pretty well with this.   She also has a history of:  1. Hypertension which has not been so well controlled.  2. Sleep apnea and CPAP which she is not using all that well.  3. Manic/depressive which is fairly well compensated.   Her brother has been diagnosed with a glioblastoma in [place] and is  taking a lot of her time and energy.   PHYSICAL EXAMINATION:  VITAL SIGNS:  Her blood pressure is pretty good  at 132/94.  Her pulse is 76.  LUNGS:  Clear.  HEART:  Sounds were regular.  EXTREMITIES:  Had no edema.   MEDICATIONS:  1. Trazodone 100.  2. Soma.  3. Toprol 12.5 b.i.d.  4. Hydrocodone.  5. Lasix.   Interrogation of her Medtronic Thera Pulse generator demonstrates a P  wave of 2 with impedance of 744, a threshold of 1 volt at 0.5.  The R  wave was 11, pacing impedance of 1451, a threshold of 1 volt at 0.5.  Estimated longevity is 6 months.   IMPRESSION:  1. Sinus node dysfunction.  2. Status post pacer for the above.  3. Hypertension.  4. Obesity.  5. Obstructive sleep apnea.   [name] is doing pretty well.  We will see her again in 6 months'  time.  And, she will be following by [employer] in the interim.     Duke Salvia, MD, [facility]  Electronically Signed    SCK/MedQ  DD: 07/29/2007  DT: 07/30/2007  Job #: [id number]

## 2011-03-11 NOTE — Progress Notes (Signed)
[facility] OFFICE NOTE   JERA, HEADINGS                      MRN:          [id number]  DATE:03/06/2008                            DOB:          06-08-1956    Susan Howell, [id number], was seen today in followup for her pacemaker  that was changed out.  Her pocket is well healed.  She has some  palpitations but is tolerating these things pretty well.   We will see her again in 9 months' time.     Duke Salvia, MD, [facility]  Electronically Signed    SCK/MedQ  DD: 03/06/2008  DT: 03/06/2008  Job #: [id number]

## 2011-03-11 NOTE — Discharge Summary (Signed)
[name], Susan Howell               ACCOUNT NO.:  [id number]   MEDICAL RECORD NO.:  [id number]          PATIENT TYPE:  INP   LOCATION:  3703                         FACILITY:  [facility]   PHYSICIAN:  Maple Mirza, PA   DATE OF BIRTH:  Jul 31, 1956   DATE OF ADMISSION:  12/07/2007  DATE OF DISCHARGE:  12/08/2007                               DISCHARGE SUMMARY   ADDENDUM:  She had an appointment scheduled originally for Wednesday,  December 22, 2007 at 10:20 at [facility], but  she also has an appointment p.m. December 14, 2007 at 2:30 at [facility].  We are going to let her keep that one on  December 14, 2007 at 2:30.  At that time, she will see Dr. Graciela Husbands and she  will have her incision checked at the same time.  Dr. Graciela Husbands will review  with her her blood pressure.  As mentioned in the body of the discharge,  her blood pressure was 194 when she came in here in the emergency room  on the evening of December 07, 2007.  She is on metoprolol 12.5 mg twice  daily.  Dr. Antoine Poche added clonidine 0.2 mg twice daily.  Her blood  pressure now is 147 at the time of discharge, systolic.  The thought  would be that the patient would keep a log of her blood pressure and she  will go home with a prescription for clonidine 0.2 mg twice daily.  If  she finds her blood pressure is higher than 130, she will start taking  the clonidine and she will see how that works and she will review this  with Dr. Graciela Husbands on December 14, 2007.      Maple Mirza, PA     GM/MEDQ  D:  12/08/2007  T:  12/10/2007  Job:  [id number]   cc:   Duke Salvia, MD, [facility]  Rollene Rotunda, MD, [facility]

## 2011-03-11 NOTE — Procedures (Signed)
Susan Howell, LOUIS               ACCOUNT NO.:  [id number]   MEDICAL RECORD NO.:  [id number]         PATIENT TYPE:  OUT   LOCATION:  SLEEP CENTER                 FACILITY:  [facility]   PHYSICIAN:  Barbaraann Share, MD,FCCPDATE OF BIRTH:  06-Jun-1956   DATE OF STUDY:  05/07/2008                            NOCTURNAL POLYSOMNOGRAM   REFERRING PHYSICIAN:  Arta Silence, MD   INTERPRETING PHYSICIAN:  Marcelyn Bruins, MD, FCCP   INDICATION FOR STUDY:  Hypersomnia with sleep apnea.  The patient has  documented sleep apnea and returns for pressure optimization.   EPWORTH SLEEPINESS SCORE:  Two.   SLEEP ARCHITECTURE:  The patient had a total sleep time of 237 minutes  with no slow-wave sleep and decreased quantity of REM.  Sleep onset  latency was prolonged at 48 minutes, and REM onset was very prolonged at  278 minutes.  Sleep efficiency was very decreased at 60%.   RESPIRATORY DATA:  The patient was fitted with an extra small Quattro  full face mask at the start of the study, and the pressure was then  increased incrementally to control both obstructive events and snoring.  At a final pressure of 10 cm of water, the patient had excellent control  of her sleep apnea even through REM.  It should be noted, however, there  was no supine sleep time on the final CPAP pressure.   OXYGEN DATA:  There was O2 desaturation as low as 88% prior to reaching  optimal CPAP pressure.   CARDIAC DATA:  No clinically significant arrhythmias were noted.   MOVEMENT/PARASOMNIA:  There were no abnormal leg jerks or behaviors  noted.   IMPRESSION/RECOMMENDATION:  Good control of previously documented  obstructive sleep apnea with 10 cm of continuous positive airway  pressure delivered by an extra small Quattro full face mask.  The  patient had  excellent control during rapid eye movement but never had supine sleep  on the final CPAP pressure.  She should be encouraged to work  aggressively on weight  loss.      Barbaraann Share, MD,FCCP  Diplomate, American Board of Sleep  Medicine  Electronically Signed     [facility]/MEDQ  D:  05/18/2008 19:35:18  T:  05/18/2008 20:07:32  Job:  [id number]

## 2011-03-11 NOTE — Discharge Summary (Signed)
Susan Howell, Susan Howell               ACCOUNT NO.:  [id number]   MEDICAL RECORD NO.:  [id number]          PATIENT TYPE:  INP   LOCATION:  3703                         FACILITY:  [facility]   PHYSICIAN:  Rollene Rotunda, MD, FACCDATE OF BIRTH:  1956/02/26   DATE OF ADMISSION:  12/07/2007  DATE OF DISCHARGE:  12/08/2007                               DISCHARGE SUMMARY   THE PATIENT HAS ALLERGIES TO CODEINE, IODINE, TETANUS TOXOID,  PENICILLIN, ADHESIVE TAPE, DIOVAN AND VANCOMYCIN.   Dictation and exam greater than 35 minutes.   FINAL DIAGNOSES:  1. Admitted with strong palpitation.  2. Interrogation of the patient's pacemaker implanted for sick sinus      syndrome demonstrates that it is at elective replacement indicator      and has switched to a preservation mode which when combined with      the patient's sinus rhythm could cause palpitation.  3. Elevated blood pressure on admission; admission systolic blood      pressure was 187.  The patient's blood pressure has ameliorated      with addition of clonidine which she will go home with.  4. Pacemaker generator change out, Dr. Lewayne Bunting.  Explanted was a      Medtronic There I [id number].  Implanted was a Medtronic Adapta L dual-      chamber pacemaker.  The patient had no postprocedural      complications, discharging the same day.   SECONDARY DIAGNOSES:  1. Permanent pacemaker implanted for sick sinus syndrome/complete      heart block.  2. Hypertension.  3. Sleep apnea intolerant of CPAP secondary to sinusitis.  4. Bipolar disorder.  5. Fibromyalgia.  6. History of gastric bypass with pulmonary embolism following the      procedure.  7. Status post herniorrhaphy, knee surgery, left back surgery.  8. Chronic pain syndrome.  9. Negative Cardiolite studies in 2004 and 2006.  10.Left heart catheterization March 2007, normal coronaries, ejection      fraction 65%.   BRIEF HISTORY:  Susan Howell admitted through the emergency room  February 10 complaining that her heart is jumping, not going fast, just  strong.  She has no syncope or presyncope with this.  Also producing  chest pain for the last 2 months, similar to pain which she had when she  came in for catheterization in March 2007.  The patient found to be  hypertensive with blood pressure in the 190s on admission.  This was  treated with clonidine and her other medications which include  metoprolol 12.5 mg twice daily.  Troponin I study x1 was 0.06.  Electrocardiogram shows sinus rhythm with what is called T-wave memory  from persistent right ventricular pacing.  She is intermittent sinus  rhythm and V pacing which was A sensed V paced event.  The device was  interrogated and found to be at elective replacement indicator.  The  patient was immediately thereafter taken to the electrophysiology lab  for generator change out.  The patient is counseled in the morning,  Thursday February 12, to remove the bandage and leave  the incision open  to air.  She is to keep her incision dry for the next 7 days, to sponge  bathe until Wednesday February 8.    Her medications at discharge include:  1. Clindamycin 300 mg 1 tablet 3 times daily.  It is suggested that      she take this with yogurt for a 5-day period.  2. A new medication, clonidine 0.2 mg twice daily for blood pressure      control.  3. Metoprolol 12.5 mg twice daily.  4. Xanax 1 mg 3 times daily.  5. Soma 350 mg daily.  6. Trazodone 150 mg at night.  7. Amitriptyline 50 mg at night.  8. LASIX 10 mg as needed.  9. Percocet as needed.  10.Prilosec as needed.  11.Enteric-coated aspirin 81 mg daily.   The patient also has roxycontin.  She is out of it currently.  She is  awaiting an appointment with pain control service for adequate pain  control going forward.  The patient to follow-up with Dr. Graciela Husbands to  assess efficacy of blood pressure management.  The patient will be told  to keep a log of her blood  pressure date and blood pressure.   LABORATORY STUDIES:  This admission, troponin I study at 0.06.  Complete  blood count:  White cells 10.3, hemoglobin 13.9, hematocrit 41.7,  platelets of 380.  Sodium is 138, potassium is 4.1, chloride 110,  carbonate 22, BUN 13, creatinine 0.7, glucose 88.  The patient also  takes Vicodin 5/500 two tablets every 4 hours as needed.      Maple Mirza, PA      Rollene Rotunda, MD, [facility]  Electronically Signed    GM/MEDQ  D:  12/08/2007  T:  12/10/2007  Job:  [id number]   cc:   Duke Salvia, MD, [facility]  Arta Silence, MD

## 2011-03-11 NOTE — Progress Notes (Signed)
[facility] OFFICE NOTE   Susan, Howell                      MRN:          [id number]  DATE:01/15/2009                            DOB:          [birth year]    [name] was seen in followup for pacemaker implanted for sick sinus  syndrome.  She has a series of other medical problems including bipolar  disorder, fibromyalgia, hypertension, sleep apnea, and chronic chest  pain.  She is doing pretty well on her new regime of clonidine and no  metoprolol for her blood pressure.   Her pulse was 68.  Her weight was 252.  Her blood pressure will be  recorded.  Her lungs were clear.  Heart sounds were regular.  Extremities were without edema.   Interrogation of Medtronic adaptive pulse generator demonstrates a P-  wave of 2.8 with impedance of 769, threshold of 0.375 at 0.4.  The R-  wave of 60 with pacing impedance of 1562, threshold of 0.74 at 0.4.  The  device was reprogrammed to improve capture stability.   IMPRESSION:  1. Sinus node dysfunction.  2. Status post pacer for sinus node dysfunction.  3. Hypertension.  4. Obesity.   [name] is doing pretty well at this point.  Continue her current  medications which will be managed largely by [facility].     Duke Salvia, MD, [facility]  Electronically Signed    SCK/MedQ  DD: 01/15/2009  DT: 01/16/2009  Job #: [id number]

## 2011-03-11 NOTE — Assessment & Plan Note (Signed)
[facility]                                 ON-CALL NOTE   FRONA, YOST                      MRN:          [id number]  DATE:01/20/2008                            DOB:          06/14/1956    PRIMARY CARDIOLOGIST:  Dr. Berton Mount.   I received a call to the answering service from [name] at [phone] on new medicine for blood pressure.  When she lays down she feels  weak.  I promptly called [name] back at the above number.  She  states she saw Dr. Graciela Husbands today for her orthostatic hypertension and was  told to stop her metoprolol was started on labetalol 400 mg p.o. b.i.d.  She states she took her first dose about 3 hours ago and felt fine  initially.  Then began to read the insert on the labetalol bottle, and  began having numbness and tingling and itching in her head, and itching  on her back, and noticed a spidery-like rash on her thighs.  She was  concerned that maybe she was having allergic reaction to the medication,  also that her blood pressure was 95/50, but she denied any presyncopal  symptoms.  I instructed her go and take 25 mg of Benadryl.  If this did  not relieve the itching and rash, she could take another 25 mg.  She was  not take any more of the labetalol until she spoke with Dr. Odessa Fleming  nurse in the morning for further instructions.  She verbalized  understanding of these instructions.  Also, if her situation did not  improve tonight or it worsened, she was to seek medical assistance  immediately.      Dorian Pod, ACNP  Electronically Signed      Bevelyn Buckles. Bensimhon, MD  Electronically Signed   MB/MedQ  DD: 01/20/2008  DT: 01/21/2008  Job #: [id number]

## 2011-03-11 NOTE — Letter (Signed)
January 20, 2008    Susan Fearing L. Deterding, M.D.  [address]  [place], [place] [address]   RE:  JUSTICE, AGUIRRE  MRN:  [id number]  /  DOB:  [birth year]   Dear Rosanne Ashing:   Thank you very much for agreeing to see Niley Helbig today in the  office.  As you will learn, she is a 55 year old lady who has multiple  cardiac risk factors, problems with chest pain with normal coronary  arteries who has a longstanding history of sinus node dysfunction for  which she underwent pacemaker implantation that was complicated by a  variety of issues for which she underwent subsequent explantation,  reimplantation and recent generator replacement.  That has not largely  been an issue.   Her major problems now prompted Dr. Hetty Ely to ask [place] to see her and  that is because he noted significant lability in her blood pressure.  VMAs and metanephrines were negative.  Five HIAA were borderline.  Other  blood work I do not have.  There have been some problems with blood  pressures recorded in the 250/150 range as well as in the 90/60 range.   PAST MEDICAL HISTORY:  In addition to the above, is notable for  1. Hypertension.  2. Obesity.  3. Obstructive sleep apnea for which she is not wearing her mask.  4. 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   CURRENT MEDICATIONS:  1. Xanax p.r.n.  2. Soma.  3. Trazodone 50 at bedtime.  4. Amitriptyline 50 bedtime.  5. Aspirin.  6. Metoprolol 50 b.i.d.   Her medication list is legion and includes penicillin, codeine,  vancomycin, iodine, atenolol causing chest pain as well as adhesive tape  and smallpox vaccine.   PHYSICAL EXAMINATION:  GENERAL APPEARANCE:  She is a middle-aged  Caucasian female appearing older than her stated age of 44.  VITAL SIGNS:  Her weight was 245 pounds, blood pressure was initially  171/103 and repeat was 160/100.  We then did orthostatics with a blood  pressure of 160/110 lying with a pulse of 59, standing at 0 minutes was  135/136  with a pulse of 69 and her blood pressure was 218/150 with a  pulse of 65 at 5 minutes.  HEENT:  Exam demonstrated no icterus or xanthoma.  NECK:  The neck veins were flat.  The carotids were brisk and full.  CARDIOVASCULAR:  Heart sounds were regular with a widely split S2.  ABDOMEN:  Soft.  EXTREMITIES:  There was 1+ peripheral edema.  NEUROLOGIC:  Exam was grossly normal.   LABORATORY DATA:  Laboratories from February 10 demonstrated a  creatinine of 0.7.   IMPRESSION:  1. Sinus node dysfunction.  2. Status post pacer for the above.  3. Orthostatic hypertension - quite severe.   Rosanne Ashing, thanks very much for asking [place] to see her.  As you and I discussed,  we will go ahead and get renal artery Dopplers.  As we further  discussed, I discontinued her metoprolol and put her on labetalol at 400  mg b.i.d.  As a mechanism, autonomic dysfunction has got to be  contributing here and sympathetic hypersensitivity is likely  contributing as the drug choices as noted.   Again, I appreciate your help in seeing her.    Sincerely,      Duke Salvia, MD, [facility]  Electronically Signed    SCK/MedQ  DD: 01/20/2008  DT: 01/20/2008  Job #: [id number]

## 2011-03-11 NOTE — H&P (Signed)
Susan Howell, JASINSKI               ACCOUNT NO.:  [id number]   MEDICAL RECORD NO.:  [id number]          PATIENT TYPE:  INP   LOCATION:  3703                         FACILITY:  [facility]   PHYSICIAN:  Rollene Rotunda, MD, FACCDATE OF BIRTH:  21-Oct-1956   DATE OF ADMISSION:  12/07/2007  DATE OF DISCHARGE:  12/08/2007                              HISTORY & PHYSICAL   PRIMARY CARE PHYSICIAN:  Dr. Hetty Ely.   Cardiologist is Dr. Sherryl Manges.   REASON FOR PRESENTATION:  Chest pain, palpitations and hypertension.   HISTORY OF PRESENT ILLNESS:  The patient is a 55 year old white female  with a long history sinus node dysfunction status post pacemaker  placement followed by Dr. Graciela Husbands.  She has also had chest discomfort  evaluated with stress perfusion studies.  She had a cardiac  catheterization with no obstructive coronary disease in 2007.  The  disease is as described below.   She presented with complaints of her heart jumping.  She did not rapid  heart rate or an irregular heart rate.  She simply described a strong  heart rate up near her neck.  This was happening mostly at night.  This  did not feel like her previous PVCs.  It has been happening for about 2-  3 days.  She did get a pacemaker check over the phone.  She was told to  get a follow-up appointment in the clinic. There was not apparently  anything wrong.  She describes approaching end of her elective  replacement.  The patient is also complaining of chest discomfort.  This  happened two nights in a row prior to this presentation.  She will often  have this pain one night but not two nights in a row, and so this  bothered her.  She describes it as a sharp pain between her breasts.  It  goes up to her jaw.  This is similar to the pain she has had at the time  of negative stress perfusion studies and the pain she has had at the  time of her catheterization.  She has not had any discomfort today.  She  is not describing any classic  substernal symptoms.  She is not having  any exertional symptoms, though she is not particularly active.  She  gets short of breath with activity chronically.  Finally the patient is  describing hypertension.  This is her most objective finding.  Blood  pressure was 199 in home.  She talked to her primary care doctor and was  told to take extra Xanax and 25 mg of metoprolol.  She was able lie down  for a while with some improvement in her blood pressure but it  subsequently went back up.   PAST MEDICAL HISTORY:  Nonanginal chest pain (catheterization March 2007  demonstrated normal LAD, normal circumflex, normal right coronary  artery.  The EF was 65%.), hypertension, obstructive sleep apnea (she  has been tolerating a CPAP secondary to sinus infection), bipolar  disorder (she had mania while caring for her brother and depression  following his death recently.  She is starting  to get euthymic), status  post pacemaker placement for sinus node dysfunction, fibromyalgia,  hypertension, a pulmonary embolism following gastric bypass surgery.   PAST SURGICAL HISTORY:  Gastric bypass 1979, herniorrhaphy, knee  surgery, back surgery.   ALLERGIES:  Intolerances IV contrast dye, tetanus, codeine, penicillin,  vancomycin and most recently valsartan.   MEDICATIONS:  Trazodone 150 mg q.h.s., Soma 350 mg q.h.s., aspirin 81 mg  daily, metoprolol 12.5 mg b.i.d., Xanax p.r.n., furosemide p.r.n.,  amitriptyline 50 mg q.h.s., Vicodin p.r.n.   SOCIAL HISTORY:  The patient lives in [place] with her husband.  She is  disabled secondary to medical problems.   FAMILY HISTORY:  Contributory for a brother who died secondary to  myocardial infarction.  This was her twin.   REVIEW OF SYSTEMS:  As stated in the HPI.  Positive for headaches,  chronic back pain, edema, chronic dyspnea with exertion,  gastroesophageal reflux, cold intolerance.  Negative for all other  systems.   PHYSICAL EXAMINATION:  GENERAL  APPEARANCE:  The patient is in no  distress.  VITAL SIGNS:  Blood pressure 187/113, heart rate 67 and regular,  respiratory rate 16, afebrile.  HEENT: Eyes are unremarkable.  Pupils equal, round, reactive. Fundi not  visualized, oral mucosa moist.  NECK:  No jugular distention at 45 degrees. Carotid upstrokes brisk and  symmetrical.  No bruits, thyromegaly.  LYMPHATICS:  No cervical, axillary, inguinal adenopathy.  LUNGS:  Clear to auscultation bilaterally.  BACK:  No costovertebral mass.  CHEST:  Well-healed pacemaker pocket.  HEART:  PMI not displaced or sustained, S1 and S2 within normal.  No S3,  no S4. No clicks, rubs, murmurs.  ABDOMEN:  Flat, positive bowel sounds,  normal in frequency and pitch.  No bruits, rebound, guarding or midline  pulsatile mass.  No hepatomegaly.  No splenomegaly.  SKIN:  No rashes, no nodules.  EXTREMITIES:  With 2+ pulse throughout. No edema, no cyanosis or  clubbing.  NEURO:  Oriented to person, place, and time.  Cranial nerves II-XII  grossly intact, motor grossly intact.   EKG:  Sinus rhythm, rate 66, first-degree AV block, axis within normal  in, intervals within normal limits, anterior T-wave inversions new  compared to previous.   ASSESSMENT AND PLAN:  1. Hypertension.  The patient's blood pressure is not well controlled.      At this point she was given clonidine in the ER without much      response.  I will repeat this and start 0.1 mg b.i.d. I am going to      increase her beta blocker to 25 mg b.i.d..  She may need further      med titration.  I do not know if she has had a secondary workup,      but I will defer to Dr. Graciela Husbands.  2. Palpitations.  These are atypical.  She has had her pacemaker      interrogated.  We will watch her on telemetry and doubt she will      need further evaluation.  3. Chest pain.  This is very atypical.  We will rule out myocardial      infarction.  Provided she has no change in her enzymes, despite the       change in the EKG, I would not pursue further evaluation of cardiac      etiology.  4. Bipolar disorder.  The patient will need to be on her usual      regimen.  5.  Chronic pain. I will write for Vicodin.  6. Fibromyalgia as above.      Rollene Rotunda, MD, [facility]  Electronically Signed     JH/MEDQ  D:  12/07/2007  T:  12/09/2007  Job:  [id number]   cc:   Arta Silence, MD

## 2011-03-11 NOTE — Op Note (Signed)
[name], Susan Howell               ACCOUNT NO.:  [id number]   MEDICAL RECORD NO.:  [id number]          PATIENT TYPE:  INP   LOCATION:  3703                         FACILITY:  [facility]   PHYSICIAN:  Doylene Canning. Ladona Ridgel, MD    DATE OF BIRTH:  November 05, 1955   DATE OF PROCEDURE:  12/08/2007  DATE OF DISCHARGE:  12/08/2007                               OPERATIVE REPORT   PROCEDURE PERFORMED:  Removal of previously implanted permanent  pacemaker which is at [facility] and insertion of a new permanent pacemaker.   INTRODUCTION:  The patient is a 54 year old woman with a history of  recurrent episodes of syncope and symptomatic bradycardia status post  pacemaker insertion who presented to the hospital with palpitations and  ventricular pacing in the VOO mode secondary to the device having  reached ERI.  She is now referred for pacemaker generator change.   PROCEDURE:  After informed consent was obtained, the patient was taken  to the diagnostic EP lab in a fasting state.  After the usual  preparation and draping, intravenous fentanyl and midazolam were given  for sedation.  30 mL lidocaine was infiltrated over the old left  infraclavicular region.  A 7 cm incision was carried out over this  region.  Electrocautery was utilized to dissect down to the fascial  plane.  The pacemaker pocket was entered without difficulty and removed  with gentle traction.  Evaluation of the leads demonstrated P-waves of 2  and R-waves of 25. The impedance was 500 in the atrium and 1200 in the  ventricle.  The threshold was 0.7 at 0.5 in the A and 2 at 0.5 in the V.  With these satisfactory parameters, the new Medtronic Adapta long acting  dual chamber pacemaker was connected to the atrial and ventricular leads  and placed back in the subcutaneous pocket.  The generator was secured  with a silk suture.  The pocket was irrigated with kanamycin. The  incision was closed with a layer of 2-0 Vicryl followed by a layer of 3-  0  Vicryl.  Benzoin was painted on the skin, Steri-Strips were applied, a  pressure dressing was placed, and the patient was returned to her room  in satisfactory condition.   COMPLICATIONS:  There were no immediate complications.   RESULTS:  This demonstrates successful removal of a previously implanted  dual chamber pacemaker which had reached ERI and insertion of a new dual  chamber pacemaker without immediate procedure complications.      Doylene Canning. Ladona Ridgel, MD  Electronically Signed     GWT/MEDQ  D:  12/08/2007  T:  12/09/2007  Job:  [id number]   cc:   Arta Silence, MD

## 2011-03-14 NOTE — Op Note (Signed)
NAMECHENEL, WERNLI                         ACCOUNT NO.:  [id number]   MEDICAL RECORD NO.:  [id number]                   PATIENT TYPE:  AMB   LOCATION:  NESC                                 FACILITY:  [facility]   PHYSICIAN:  Jamison Neighbor, M.D.               DATE OF BIRTH:  1956/09/25   DATE OF PROCEDURE:  09/09/2002  DATE OF DISCHARGE:                                 OPERATIVE REPORT   SERVICE:  Urology.   PREOPERATIVE DIAGNOSES:  Pelvic pain, urgency and frequency, probable  interstitial cystitis.   POSTOPERATIVE DIAGNOSES:  Interstitial cystitis.   PROCEDURE:  Cystoscopy, urethral calibration, examination under anesthesia,  hydrodistention of the bladder, bladder biopsy, Marcaine and Pyridium  installation, Marcaine and Kenalog injection.   SURGEON:  Jamison Neighbor, M.D.   ANESTHESIA:  General.   COMPLICATIONS:  Linear tears detected during hydrodistention.   DRAINS:  16 French Foley catheter.   BRIEF HISTORY:  This 55 year old female has multiple medical problems. She  is currently narcotic dependent due to a combination of pelvic pain as well  as back pain and has been under the care of Dr. Donalee Citrin. The patient has  been referred for evaluation of possible interstitial cystitis. The  patient's symptoms are certainly consistent with this condition. She was  told that we could consider empiric therapy but before embarking on long-  term medications she wanted to be sure that she indeed did suffer from this  condition. She was offered the possibility of having a potassium test  performed but elected to undergo a diagnostic cystoscopy instead. She is  aware of the fact that she may require increasing medications for the first  week or so following the procedure. She is also aware that there is no  guarantee that she will have any therapeutic benefit from this test. She has  been told, however, that there is approximately a 50% chance that she will  have improvement  in her symptoms although how this will last is certainly  unknown. The patient agrees to the diagnostic cystoscopy with the hope that  she will have some therapeutic benefit. She gave full and informed consent.   DESCRIPTION OF PROCEDURE:  After successful induction of general anesthesia,  the patient was placed in dorsal lithotomy position, prepped with Betadine  and draped in the usual sterile fashion. Careful bimanual examination showed  that she had an unremarkable urethra, there were no signs of a diverticulum  or suburethral mass. She had a normal anterior vaginal mucosa with no real  significant cystocele, no enterocele could be detected. The patient did have  a fairly pronounced rectocele. No other abnormalities were detected on the  pelvic examination. The urethra was calibrated to a 20 [place] with [employer]. The cystoscope was then inserted, the bladder was carefully  inspected and was free of any tumor stones. Both ureteral orifices were  normal in  configuration and location. The patient then underwent bladder  distention at a pressure of 100 cm of water. The patient had a very thin  bladder and linear tears began to develop. Inflow was then stopped, the  bladder capacity was determined to be 850 cc which is somewhat diminished  with normal being approximately 1100. As the bladder was drained,  glomerulations were detected throughout the bladder consistent with  interstitial cystitis. A biopsy was taken and was sent for mast call  analysis. The biopsy site was cauterized. Because of the linear tears, it  was felt the patient should have a Foley catheter left in place. A 16 French  catheter was inserted and irrigated freely, it was placed to straight  drainage. The mixture of Marcaine and Pyridium was left within the bladder.  Marcaine and Kenalog were injected periurethrally. The patient was given a  preoperative B&O suppository. The patient received intraoperative  Toradol  and Zofran to try and modify her pain response. She will continue on the  Percocet she has at home. She also has UroMax to help with any symptoms  associated with the catheter. The patient will return next week for catheter  removal. She will be given Levaquin for several days.                                               Jamison Neighbor, M.D.    RJE/MEDQ  D:  09/09/2002  T:  09/09/2002  Job:  [id number]   cc:   Laurita Quint, M.D.   Donalee Citrin, M.D.  [address]  [place]  [place] [address]  Fax: [phone]

## 2011-03-14 NOTE — Discharge Summary (Signed)
NAMECHASTA, DESHPANDE               ACCOUNT NO.:  [id number]   MEDICAL RECORD NO.:  [id number]          PATIENT TYPE:  INP   LOCATION:  2033                         FACILITY:  [facility]   PHYSICIAN:  Maple Mirza, P.A. DATE OF BIRTH:  13-Jun-1956   DATE OF ADMISSION:  01/14/2006  DATE OF DISCHARGE:  01/16/2006                                 DISCHARGE SUMMARY   ALLERGIES:  1.  IV CONTRAST DYE.  2.  TETANUS TOXOID.  3.  CODEINE.  4.  PENICILLIN.  5.  VANCOMYCIN.   PRINCIPAL DIAGNOSES:  1.  Admitted with chest pain, a two-week feeling of heaviness, fatigue.      1.  Troponin I studies 0.02, then 0.05, then 0.01.      2.  Electrocardiogram nondiagnostic for acute cardiac event.      3.  Heart catheterization March 22.  Coronaries are angiographically          normal.  Ejection fraction 65%.  No metatarsal.  No wall motion          abnormalities.  2.  Atypical chest pain of noncardiac origin started on Protonix for      possible reflux esophageal spasm.  3.  Pacemaker placed 1997 for sinus node dysfunction.   SECONDARY DIAGNOSES:  1.  Status post gastric bypass, 1979 with complication of pulmonary embolism      post procedure.  2.  Hypertension.  3.  Obstructive sleep apnea/continuous positive airway pressure.  4.  Bipolar disorder.  5.  Status post pacemaker for sinus node dysfunction.  6.  Normal Cardiolite study 2004.  7.  Normal Cardiolite study 2006.  8.  Fibromyalgia.  9.  Status post herniorrhaphy.  10. Status post knee surgery.  11. Status post back surgery.   PROCEDURE:  January 15, 2006 - left heart catheterization, Dr. Arvilla Meres.  This study showed that her coronary arteries are  angiographically normal including the LAD, the circumflex and the right  coronary artery.  Ejection fraction 65% on left ventriculogram.  No mitral  regurgitation.  No wall motion abnormalities.  Medical therapy will be  continued.   PLAN:  1.  Add Protonix 40 mg daily for proton  pump inhibitor action.  2.  Start metoprolol 75 mg b.i.d.  3.  Follow up with Dr. Hetty Ely.   BRIEF HISTORY:  [name] is a 55 year old female with no known history of  coronary artery disease.  She has had episodes of atypical chest pain in the  past.  They have resulted in adenosine Myoview studies in 2004 and 2006.  Both of these studies were negative and the EF was normal.  [name] has  a permanent pacemaker secondary to sinus node dysfunction.  She comes to the  emergency room on March 21 complaining of chest discomfort which started two  weeks ago.  It is chest pressure.  She has tingling around the lips and  lightheadedness.  She also complains of increased fatigue and decreased  energy.  When she gets chest discomfort, it feels as if something is sitting  on her chest and  she is unable to take a deep breath.  She already sleeps on  an elevated bed.  She denies orthopnea or paroxysmal nocturnal dyspnea.  She  feels a little puffy in the abdominal area.  She takes Lasix p.r.n. but has  not taken anything in four months.  She denies any peripheral edema.  This  morning, she felt more short of breath than usual and the chest pressure was  a little worse.  She complains of a mild chest pressure, about 2/10, at  worst is 5-6/10.  The patient will be admitted.  Troponin I studies will be  cycled.  Because of the patient's recurrent chest pain, she will under left  heart catheterization.   HOSPITAL COURSE:  The patient presented with chest heaviness of two-weeks'  duration.  She had troponin I studies which were negative essentially.  Electrocardiogram showed no evidence of acute cardiac effect.  D-dimer was  2.01.  Because of past history of pulmonary embolism, a CT of the chest was  obtained.  Pulmonary embolism was ruled out by CT of the chest.  The  patient's chest pain essentially abated by hospital day #2.  She underwent  left heart catheterization the same day, March 22.   This study showed normal  coronary arteries.  The patient was discharged day 1 after undergoing left  heart catheterization.   She goes home on the following medications:  1.  Soma 350 mg daily at bedtime.  2.  Trazodone 100 mg daily at bedtime.  3.  Protonix 40 mg daily.  This is new.  4.  Enteric-coated aspirin 325 mg daily.  This is new.  5.  Metoprolol 75 mg twice daily.  This is new.  6.  Xanax 1 mg as needed.  7.  Lasix 20 mg tab as needed.  8.  Mangosteen juice 3 ounces daily.   She will follow up with Dr. Candis Musa.  She is asked not to drive for the next  2-3 days.  Not to lift anything heavier than 10 pounds for the next four  weeks.   DISCHARGE DIET:  Low sodium, low cholesterol diet.   LABORATORY STUDIES THIS ADMISSION:  Complete blood count on March 23, white  cells 15.3, hemoglobin 13.6, hematocrit 40.8, platelets 466.  Serum  electrolytes sodium 138, potassium 4, chloride 104, bicarbonate 25, BUN 26,  creatinine 1.1, glucose 195.  The patient has been on heparin this  admission.  This will be stopped prior to discharge.  Liver function studies  alkaline phosphatase 79, SGOT is 25, SGPT is 19.  Troponin I studies again  0.02, then 0.05, then 0.01.  TSH was 2.654.  The BNP on admission was less  than 30.  D-dimer was 2.01.  Ruled out for myocardial infarction.  HGbA1c  was 5.6.  Lipid profile this admission cholesterol 200, triglycerides 49,  HDL cholesterol is 83, LDL cholesterol is 107.   I make note of the fact that her white cell count has been elevated 15.4,  then 14.6, then 13.6.  No urinalysis has been obtained.  I will get one  prior to discharge.      Maple Mirza, P.A.     GM/MEDQ  D:  01/16/2006  T:  01/19/2006  Job:  [id number]   cc:   Duke Salvia, M.D.  [address]  [address]  [place]  [place] [address]   Laurita Quint, M.D.  Fax: [phone]

## 2011-03-14 NOTE — Assessment & Plan Note (Signed)
[facility]                           GASTROENTEROLOGY OFFICE NOTE   Susan Howell, Susan Howell                      MRN:          [id number]  DATE:06/09/2006                            DOB:          05/10/1956    GASTROENTEROLOGY CONSULTATION:   CHIEF COMPLAINT:  Diarrhea, abdominal pain, bleeding.   ASSESSMENT:  Bloody stools, diarrhea, abdominal pain, crampy.  Irritable  bowel syndrome versus inflammatory bowel disease versus colon cancer or  neoplasia are all possibilities.  She complains of proctalgia fugax and has  a long history of crampy abdominal pains, and I suspect this is irritable  bowel with anorectal bleeding, but we need to confirm what it is.   PLAN:  Schedule colonoscopy.  She just had foot surgery.  She will wait a  few weeks.  Her hemoglobin was normal a few weeks ago.  The symptoms have  been doing on for 2-3 months.  There is no weight loss, fever or anything to  suggest an obvious infectious problem.   HISTORY:  This is a 55 year old white woman whom I know from previous  abdominal pain evaluation where she had an upper GI small bowel follow  through and EGD, which were unrevealing, other than some prominent folds in  the proximal stomach.  She has had a gastroenterostomy, as well as a  fundoplication.  She had anti-obesity surgery years ago.  She has been  having bilateral lower abdominal pain, crampy, and passing and some mucus  and bright red blood per rectum.  This has been off and on.  For the past  few days, that has actually been better, as she has been on Percocet after  she had foot surgery.  Laboratory investigation on May 19, 2006 showed a  hemoglobin of 13.1.  Her hemoglobin was 16 on April 26, 2006, as well as 13.9  on different labs that day.  She was admitted into the hospital briefly and  had cardiac catheterization because of chest pain.  She seems to think there  may have been some association with learning that  her brother died of a  heart attack within a few months prior to that.  She was actually in the  hospital in March and had a cardiac catheterization which did not reveal any  significant coronary artery disease at this time.  Ejection fraction was  65%.   OTHER PAST MEDICAL HISTORY:  1. Obesity with previous surgery as noted.  2. Pacemaker followed by Dr. Graciela Husbands.  3. Hyperglycemia/diabetes.  4. Tonsillectomy.  5. Knee surgery.  6. Hypertension; never on medication.  7. Gastric bypass with multiple complications including esophageal      abscess, sepsis, gastric abscess with splenectomy, pulmonary embolism      and DVT.  8. She describes a hernia identified at previous hysterectomy surgery.  9. She has also had a tubal ligation.  10.She has had multiple foot and ankle surgeries.  11.Right carpal tunnel surgery.  12.Chest wall surgery.  13.Right elbow lymph node biopsy.  14.Anxiety and depression and bipolar problems.  15.Osteoarthritis.   MEDICATIONS:  1. Trazodone 100 mg  q.h.s.  2. Soma 350 mg q.h.s.  3. Aspirin 81 mg daily.  4. Xanax p.r.n.  5. Percocet p.r.n.   DRUG ALLERGIES:  1. PENICILLIN.  2. TETANUS.  3. CODEINE.  4. VANCOMYCIN.  5. SMALLPOX VACCINE.  6. ATENOLOL which caused chest pain.  7. ADHESIVE TAPE.  8. IODINE/IV CONTRAST.   FAMILY HISTORY:  As noted above.  There is no family history of colon  cancer.   SOCIAL HISTORY:  She does not smoke.  She lives with her husband in [place].   REVIEW OF SYSTEMS:  As above regarding chest pain issues.  She has had some  recurrent chest pain.  She is using her Prilosec intermittently and is not  sure that that helps.  She had positive D-dimers of unclear etiology.  I  believe they checked her for pulmonary embolism, and that was negative, she  tells me.  Her pacemaker is for sick sinus syndrome.  She denies fever or  chills.  She was in the emergency department with a skin rash in late July;  she had hives.  She has  had spasms in her throat.  She has had the pain in  the rectum intermittently, which actually is something going on for years  where she gets a spastic, very crampy and intense rectal pain.   PHYSICAL EXAMINATION:  GENERAL:  A pleasant, morbidly obese white woman in  no acute distress.  VITAL SIGNS:  Height 5 feet, 1 inch, weight 257 pounds.  Pulse 80, blood  pressure 160/102.  HEENT:  Eyes anicteric.  NECK:  Supple.  CHEST:  Clear.  HEART:  S1 and S2.  I hear no rubs, murmurs or gallops.  ABDOMEN:  Obese.  There are multiple surgical scars.  I do not detect any  obvious hernia.  It is a very obese abdomen, so the exam is somewhat  limited.  There is no organomegaly or mass detected.  RECTAL:  Deferred.  EXTREMITIES:  Trace peripheral edema.  SKIN:  Free of acute rash at this time.  NEUROLOGIC:  She is awake, alert and oriented x3.  LYMPHATICS:  I detect no cervical or supraclavicular lymphadenopathy.   I appreciate the opportunity to care for this patient.  I have reviewed ER  records, laboratory records and E chart, [place] office chart, as well as  her cardiac catheterization report and recheck hospitalization records.   Note - she did have CT angiography of the chest, which was negative for  pulmonary emboli.  That was on April 27, 2006.  She had one in March, as well.  The etiology of this elevated D-dimer is not entirely clear.   She may need a CT of the abdomen and pelvis, depending upon what we are  seeing here.  She is currently symptomatically improved.  Ischemia or  adhesions and problems are possible, as well.  She is not a vasculopath, so  an internal hernia or something like that would be more likely, I think, but  overall I do favor irritable bowel syndrome versus inflammatory bowel  disease at this time.  Colonoscopy will be scheduled as described above.                                  Iva Boop, MD, [facility]   CEG/MedQ  DD:  06/09/2006  DT:  06/09/2006   Job #:  [id number]   cc:   Arta Silence,  MD

## 2011-03-14 NOTE — Cardiovascular Report (Signed)
[name], [name]               ACCOUNT NO.:  [id number]   MEDICAL RECORD NO.:  [id number]          PATIENT TYPE:  INP   LOCATION:  2033                         FACILITY:  [facility]   PHYSICIAN:  Arvilla Meres, M.D. [facility] OF BIRTH:  1956-04-09   DATE OF PROCEDURE:  01/15/2006  DATE OF DISCHARGE:  01/16/2006                              CARDIAC CATHETERIZATION   PRIMARY CARE PHYSICIAN:  Dr. Laurita Quint.   CARDIOLOGY:  Dr. Berton Mount.   PATIENT IDENTIFICATION:  [name] is a 55 year old woman with multiple  cardiac risk factors. She has had a several week history of progressive  chest pain in the setting of significant family stress. She ruled out for  myocardial infarction with serial cardiac enzymes and is now referred to the  catheterization lab for diagnostic angiography. Of note, she does have a dye  allergy and she was premedicated in the standard fashion prior to her heart  catheterization.   PROCEDURES PERFORMED:  1.  Selective coronary angiography.  2.  Left heart cath.  3.  Left ventriculogram.   DESCRIPTION OF PROCEDURE:  Risks and benefits of the catheterization were  explained. Consent was signed and placed in the chart. A 6-French arterial  sheath was placed in the right femoral artery using a modified Seldinger  technique. Standard catheters including preformed Judkins JL-4, JR-4 and  angled pigtail were used for the procedure. All catheter exchanges made over  wire. There were no apparent complications.   CORONARY ANGIOGRAPHY:  Left main: Normal.   LAD was a long vessel coursing to the apex. It gave off a large diagonal  proximally and a small diagonal distally. There was no angiographic CAD.   Left circumflex was a small vessel. It gave off a small OM-1 and a small to  OM-2. It was angiographically normal.   Right coronary artery was a large dominant vessel. It gave off large PDA and  four posterolaterals. There was no angiographic CAD.   The  left ventriculogram done in the RAO position showed an EF of 65% with no  wall motion abnormalities or mitral regurgitation.   ASSESSMENT:  1.  Normal coronary arteries.  2.  Normal left ventricular function with mildly increased EDP.   PLAN:  Will be to continue with medical therapy and risk factor management.      Arvilla Meres, M.D. [facility]  Electronically Signed     DB/MEDQ  D:  01/15/2006  T:  01/17/2006  Job:  [id number]   cc:   Duke Salvia, M.D.  [address]  [address]  [place]  [place] [address]   Laurita Quint, M.D.  Fax: [phone]

## 2011-03-14 NOTE — Procedures (Signed)
[name], Susan Howell               ACCOUNT NO.:  [id number]   MEDICAL RECORD NO.:  [id number]          PATIENT TYPE:  OUT   LOCATION:  SLEEP CENTER                 FACILITY:  [facility]   PHYSICIAN:  Marcelyn Bruins, M.D. [facility] DATE OF BIRTH:  1956/05/16   DATE OF STUDY:  08/25/2005                              NOCTURNAL POLYSOMNOGRAM   REFERRING PHYSICIAN:  Dr. Laurita Quint   INDICATION FOR THE STUDY:  Hypersomnia with sleep apnea.   EPWORTH SCORE:  3   SLEEP ARCHITECTURE:  The patient had a total sleep time of 330 minutes with  significantly decreased slow wave sleep but adequate REM. Sleep onset  latency was prolonged at 14 minutes and REM onset was normal. Sleep  efficiency was significantly decreased at 73%.   RESPIRATORY DATA:  The patient was found to have 32 apneas and 30 hypopneas  for a respiratory disturbance index of 11 events per hour. The patient did  not meet split night protocol, however, because of the small numbers of her  events. The events were not positional and there was moderate snoring noted  throughout.   OXYGEN DATA:  O2 desaturation as low as 81% was noted with her obstructive  events.   CARDIAC DATA:  Paced and nonpaced rhythm with no clinically significant  arrhythmias were noted.   MOVEMENTS/PARASOMNIA:  None.   IMPRESSION/RECOMMENDATION:  Mild obstructive sleep apnea/hypopnea syndrome  with a respiratory disturbance index of 11 events per hour and desaturation  to 81% transiently. Treatment for this degree of sleep apnea may include  weight loss alone, oral appliance, upper airway surgery, or CPAP. Clinical  correlation is suggested since treatment decisions will be solely based on  how this is impacting her quality of life.           ______________________________  Marcelyn Bruins, M.D. [facility]  Diplomate, American Board of Sleep  Medicine     KC/MEDQ  D:  08/29/2005 10:05:18  T:  08/29/2005 11:49:38  Job:  [id number]

## 2011-03-14 NOTE — Op Note (Signed)
Susan Howell, Susan Howell               ACCOUNT NO.:  [id number]   MEDICAL RECORD NO.:  [id number]          PATIENT TYPE:  AMB   LOCATION:  SDS                          FACILITY:  [facility]   PHYSICIAN:  Vanita Panda. Magnus Ivan, M.D.DATE OF BIRTH:  06-Sep-1956   DATE OF PROCEDURE:  07/04/2005  DATE OF DISCHARGE:                                 OPERATIVE REPORT   PREOPERATIVE DIAGNOSIS:  Left knee internal derangement.   POSTOPERATIVE DIAGNOSES:  1.  Left knee lateral meniscus anterior horn tear.  2.  Grade-4 cartilage changes patella.  3.  Grade-4 chondromalacia of patella.  4.  Loose intra-articular body.   PROCEDURE:  1.  Left knee arthroscopy with debridement.  2.  Partial lateral meniscectomy.  3.  Removal of loose body.   SURGEON:  Vanita Panda. Magnus Ivan, M.D.   ANESTHESIA:  General.   TOURNIQUET TIME:  34 minutes.   ANTIBIOTICS:  One gram IV Ancef.   COMPLICATIONS:  None.   INDICATIONS:  Briefly, [name] is a 55 year old female with a long  history of left knee pain.  Conservative therapy consisting of physical  therapy, injections as well as nonsteroidals have failed to provide relief.  She is not one that can take a lot of nonsteroidals secondary to previous  gastric bypass surgery.  She is 5 feet 1 inch and weighs 255 pounds and has  continued to be plagued by the knee pain.  She also has history of pacemaker  placement, and an MRI was not obtained.  X-rays of the knee on just plain  films showed mild cartilage changes and given her complaint of locking and  catching of the knee, I thought it was appropriate to bring her to the  operating room for diagnostic and hopefully therapeutic arthroscopy.  The  risks and benefits of this were explained to her, and she demonstrated the  ability to understand this.   PROCEDURE DESCRIPTION:  After informed consent was obtained and her left leg  was marked appropriately, she was brought to the operating room and placed  supine  on the operating table.  General anesthesia was then obtained.  A  nonsterile tourniquet was placed around her thigh, and then her leg from the  thigh to the ankle was prepped with DuraPrep and sterile drapes including a  sterile stockinette.  A post was used so that the leg could be brought off  the table into a flexed position.  A lateral portal was made first at the  level of the inferior pole of the patella just lateral to the patellar  tendon.  The arthroscope was inserted and diagnostic tour was undertaken of  the knee.  There was noted to be significant cartilage changes with exposed  bone underneath the patella and the lateral femoral condyle.  The medial  femoral condyle, medial tibial plateau and lateral tibial plateau were all  found to be intact.  A medial portal was then made at the same level of the  inferior pole of the patella just medial to the patellar tendon, and a probe  was inserted.  The medial meniscus  was probed and found to be intact.  The  ACL was probed and found to be intact as well.   Next, with the knee in a figure-4 position, the lateral meniscus was  assessed.  There was degenerative tearing of the anterior horn of the  meniscus.  Using an arthroscopic shaver, I did do debridement and a partial  lateral meniscectomy.  There was not a portion that could be subluxed into  the joint, and I was able to demonstrate this.  During this process, a large  loose body was seen floating in the intercondylar area, and a pituitary was  used to remove this through the medial portal.  Next, there was noted to be  significant cartilage changes with a large cartilage deficit of the lateral  femoral condyle with exposed bone as well as underneath the patella.  Free  edges of cartilage were debrided with the shaver.  Fluid was then allowed to  lavage the joint.  A shaver and scope were removed.  Excess fluid was  removed from the knee as well.  Of note, during the case the  tourniquet had  to be inflated at 350 mm of pressure.  The tourniquet then was let down at  34 minutes, and the toes did pink up nicely.  A mixture of 4 mg of morphine  and plain 0.25% Sensorcaine was injected in the knee, a total of 15 cc.  The  arthroscopic portals were then closed with interrupted 4-0 nylon suture.  A  well-padded sterile dressing was applied, and the patient was awakened,  extubated and taken to the recovery room in stable condition.           ______________________________  Vanita Panda. Magnus Ivan, M.D.     CYB/MEDQ  D:  07/04/2005  T:  07/04/2005  Job:  [id number]

## 2011-03-14 NOTE — Assessment & Plan Note (Signed)
[facility]                         ELECTROPHYSIOLOGY OFFICE NOTE   Susan Howell, Susan Howell                      MRN:          [id number]  DATE:02/05/2007                            DOB:          13-Feb-1956    [name] is seen.  She is status post pacemaker implantation for a  complete heart block.  She approaching end-of-life.   She has had problems with recurrent chest pain, which has been evaluated  with catheterization demonstrating normal arteries, apparently CAT  scans, negative venous Dopplers.  She is also taking antacids  periodically.   There has been some correlation with blood pressure both very high as  well as some problems with it being low.   She has impending orthopedic surgery.  She has just gotten out of being  debilitated from orthopedic surgery.  Her weight continues to be a major  issue.   PHYSICAL EXAMINATION:  VITAL SIGNS:  Her weight is 252.  Blood pressure  is 164/108, pulse is 74.  LUNGS:  Clear.  HEART:  Sounds were regular.  EXTREMITIES:  Without edema.   Interrogation of her pacemaker demonstrated a Medtronic TheraPulse  generator demonstrated a P wave greater than 2 with impedance of 721, a  threshold of 1 volt at 0.5.  The R wave was 11.2 with impedance of 1294  and a threshold of 1.5 at 0.5.   Her estimated longevity is 11 months.   IMPRESSION:  1. Intermittent complete heart block.  2. Status post pacer for the above, approaching end-of-life.  3. Hypertension.  4. Fatigue and thought disturbances, hopefully attributable to the      Toprol.  5. Atypical chest pain with evaluation including:      a.     Negative recent catheterization.      b.     Apparently negative CT scan.      c.     Negative venous Doppler studies.   [name] is stable from a pacemaker point-of-view.  She continues  monthly transtelephonic followup.   As it relates to her blood pressure, I have told her to stop the Toprol,  as  it sounds like it is effecting her thinking as well as her energy  level.  I have given her samples for Diovan 80 as  well as a prescription for the same.  She will need to get her B-MET  checked in about 2-3 weeks, which she can do at Dr. Lorenza Howell office.   We will see her again in 6 months' time, as she approaches ERI.     Susan Salvia, MD, [facility]  Electronically Signed    SCK/MedQ  DD: 02/05/2007  DT: 02/05/2007  Job #: [id number]
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
## 2011-03-14 NOTE — Assessment & Plan Note (Signed)
[facility]                           GASTROENTEROLOGY OFFICE NOTE   MARNI, FRANZONI                      MRN:          [id number]  DATE:06/09/2006                            DOB:          Jan 06, 1956    ADDENDUM:  I did prescribe Hyoscyamine 0.125 mg as needed, #90 with no  refills, and told her to take her Prilosec OTC daily.  Some of her chest  pain issues may be reflux.  She has also had some choking on saliva at some  point but no real dysphagia, which may be a reflux manifestation.  I do not  think an as needed or intermittent approach makes sense for the time being.                                   Iva Boop, MD, Clementeen Graham   CEG/MedQ  DD:  06/09/2006  DT:  06/09/2006  Job #:  [id number]   cc:   Arta Silence, MD
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
## 2011-03-14 NOTE — Op Note (Signed)
[place]  Patient:    Susan Howell, Susan Howell Visit Number: [id number] MRN: [id number]          Service Type: MED Location: 3000 [id number] Attending Physician:  Mariam Dollar Dictated by:   Donzetta Sprung. Roney Jaffe., M.D. Proc. Date: 03/15/02 Admit Date:  03/23/2002 Discharge Date: 03/28/2002                             Operative Report  ADMISSION DIAGNOSIS:  L1 burst fracture.  DISCHARGE DIAGNOSIS:  L1 burst fracture.  HOSPITAL COURSE:  The patient is a very pleasant 55 year old female who was admitted through the emergency room with severe back pain.  Examination and imaging in the ER revealed an L1 burst fracture with minimal canal compromise and loss of height.  The patient was neurologically intact, was moved to the floor, and was fitted for TLSO and slowly mobilized over the next couple of days with physical therapy.  The patient was also seen by trauma for evaluation of abdominal pain and this workup ended up being negative.  Over the next couple of days in the hospital, the patient mobilized better with physical therapy, was refitted for her lumbar brace and her upright films showed stable appearance of the L1 burst fracture.  The patient in addition was also seen by teaching service for evaluation of her pacemaker because it was noted to be pausing.  This was all checked out and everything was felt to be okay.  At the time of discharge, the patient was discharged with home health physical therapy and was scheduled for follow-up in two weeks with repeat AP and lateral C-spine films.  CONDITION ON DISCHARGE:  She was discharged in stable condition. Dictated by:   Donzetta Sprung. Roney Jaffe., M.D. Attending Physician:  Mariam Dollar DD:  04/08/02 TD:  04/11/02 Job: [id number]

## 2011-03-14 NOTE — Discharge Summary (Signed)
   Susan Howell, Susan Howell                           ACCOUNT NO.:  [id number]   MEDICAL RECORD NO.:  [id number]                   PATIENT TYPE:   LOCATION:                                       FACILITY:   PHYSICIAN:  Donalee Citrin, MD                       DATE OF BIRTH:  Jun 24, 1956   DATE OF ADMISSION:  06/17/2002  DATE OF DISCHARGE:  06/22/2002                                 DISCHARGE SUMMARY   ADMISSION DIAGNOSIS:  L1 burst fracture.   PROCEDURE:  T11-L3 nonsegmental posterior spinal fusion using the N10C  Horizon pedicle screw system and BMP as well as locally harvested autograft  and ______ substitute.   HISTORY OF PRESENT ILLNESS:  The patient is a very pleasant 55 year old  female who has had L1 burst fracture that has been managed conservatively.  However, noted to have progressive worsening in her followup films and  progressive increase in back pain.  The patient was recommended  stabilization procedure.   HOSPITAL COURSE:  The patient was brought to the OR and underwent the  aforementioned procedure.  Postoperatively the patient did very well and  went to the recovery room and the floor.  On the floor, was doing very well.  Did spike a temperature to 103 on the first postoperative day.  The patient  was maintained on her antibiotics. This temperature spike was simply related  to receiving some vancomycin.  She was switched to Cipro and slowly  mobilized with physical therapy.  Over the next couple of days, the patient  did very well.  Hemovac drain was able to be taken out.  Her Foley catheter  was discontinued on postoperative day #1.  She was continued to mobilize  with physical therapy and by the time of discharge, the patient was  ambulating with therapy in a walker doing very well.  Postoperative films  showed excellent alignment.  The patient was discharged with followup in approximately two weeks and  repeat x-rays.     Donalee Citrin, MD    GC/MEDQ  D:  08/26/2002  T:  08/26/2002  Job:  [id number]

## 2011-03-20 ENCOUNTER — Emergency Department (HOSPITAL_COMMUNITY)
Admission: EM | Admit: 2011-03-20 | Discharge: 2011-03-20 | Disposition: A | Discharge status: Home / Self Care | Payer: Medicaid Other | Attending: Emergency Medicine | Admitting: Emergency Medicine

## 2011-03-20 ENCOUNTER — Emergency Department (HOSPITAL_COMMUNITY): Payer: Medicaid Other

## 2011-03-20 DIAGNOSIS — G8929 Other chronic pain: Secondary | ICD-10-CM | POA: Insufficient documentation

## 2011-03-20 DIAGNOSIS — Z86718 Personal history of other venous thrombosis and embolism: Secondary | ICD-10-CM | POA: Insufficient documentation

## 2011-03-20 DIAGNOSIS — M549 Dorsalgia, unspecified: Secondary | ICD-10-CM | POA: Insufficient documentation

## 2011-03-20 DIAGNOSIS — Z79899 Other long term (current) drug therapy: Secondary | ICD-10-CM | POA: Insufficient documentation

## 2011-03-20 DIAGNOSIS — R079 Chest pain, unspecified: Secondary | ICD-10-CM | POA: Insufficient documentation

## 2011-03-20 DIAGNOSIS — R110 Nausea: Secondary | ICD-10-CM | POA: Insufficient documentation

## 2011-03-20 DIAGNOSIS — Z950 Presence of cardiac pacemaker: Secondary | ICD-10-CM | POA: Insufficient documentation

## 2011-03-20 DIAGNOSIS — M810 Age-related osteoporosis without current pathological fracture: Secondary | ICD-10-CM | POA: Insufficient documentation

## 2011-03-20 DIAGNOSIS — R61 Generalized hyperhidrosis: Secondary | ICD-10-CM | POA: Insufficient documentation

## 2011-03-20 DIAGNOSIS — I10 Essential (primary) hypertension: Secondary | ICD-10-CM | POA: Insufficient documentation

## 2011-03-20 LAB — CBC
HCT: 40.3 % (ref 36.0–46.0)
MCH: 32.7 pg (ref 26.0–34.0)
MCV: 92.2 fL (ref 78.0–100.0)
RDW: 14.1 % (ref 11.5–15.5)
WBC: 7.9 10*3/uL (ref 4.0–10.5)

## 2011-03-20 LAB — BASIC METABOLIC PANEL
BUN: 12 mg/dL (ref 6–23)
Chloride: 101 mEq/L (ref 96–112)
Creatinine, Ser: 0.58 mg/dL (ref 0.4–1.2)
GFR calc non Af Amer: >60 mL/min (ref >60)
Glucose, Bld: 94 mg/dL (ref 70–99)
Potassium: 3.4 mEq/L — ABNORMAL LOW (ref 3.5–5.1)

## 2011-03-20 LAB — TROPONIN I: Troponin I: <0.3 ng/mL (ref <0.30)

## 2011-03-20 LAB — URINALYSIS, ROUTINE W REFLEX MICROSCOPIC
Glucose, UA: NEGATIVE mg/dL
Nitrite: NEGATIVE
Specific Gravity, Urine: 1.009 (ref 1.005–1.030)
pH: 6 (ref 5.0–8.0)

## 2011-03-20 LAB — DIFFERENTIAL
Eosinophils Absolute: 0 10*3/uL (ref 0.0–0.7)
Eosinophils Relative: 0 % (ref 0–5)
Lymphocytes Relative: 46 % (ref 12–46)
Lymphs Abs: 3.7 10*3/uL (ref 0.7–4.0)
Monocytes Relative: 13 % — ABNORMAL HIGH (ref 3–12)

## 2011-05-01 ENCOUNTER — Encounter: Payer: Medicaid Other | Admitting: *Deleted

## 2011-05-29 ENCOUNTER — Encounter: Payer: Medicaid Other | Admitting: *Deleted

## 2011-06-03 ENCOUNTER — Encounter: Payer: Self-pay | Admitting: *Deleted

## 2011-06-06 ENCOUNTER — Other Ambulatory Visit: Payer: Self-pay | Admitting: Internal Medicine

## 2011-06-06 ENCOUNTER — Ambulatory Visit (INDEPENDENT_AMBULATORY_CARE_PROVIDER_SITE_OTHER): Payer: Medicaid Other | Admitting: *Deleted

## 2011-06-06 DIAGNOSIS — I495 Sick sinus syndrome: Secondary | ICD-10-CM

## 2011-06-13 LAB — REMOTE PACEMAKER DEVICE
AL AMPLITUDE: 2.8 mv
AL THRESHOLD: 1.25 V
BAMS-0001: 175 {beats}/min
RV LEAD AMPLITUDE: 22.4 mv
RV LEAD THRESHOLD: 1.25 V

## 2011-06-17 NOTE — Progress Notes (Signed)
Pacer remote check  

## 2011-06-25 ENCOUNTER — Encounter: Payer: Self-pay | Admitting: *Deleted

## 2011-07-18 LAB — I-STAT 8, (EC8 V) (CONVERTED LAB)
BUN: 13
Bicarbonate: 22.8
HCT: 46
Hemoglobin: 15.6 — ABNORMAL HIGH
Operator id: 277751
Sodium: 138
TCO2: 24

## 2011-07-18 LAB — DIFFERENTIAL
Basophils Absolute: 0
Eosinophils Absolute: 0.1
Lymphocytes Relative: 63 — ABNORMAL HIGH
Neutro Abs: 2.6
Neutrophils Relative %: 25 — ABNORMAL LOW
Smear Review: ADEQUATE

## 2011-07-18 LAB — CBC
MCHC: 33.3
Platelets: 380
RDW: 15

## 2011-07-18 LAB — POCT CARDIAC MARKERS
Myoglobin, poc: 43.6
Operator id: 277751
Troponin i, poc: <0.05

## 2011-07-18 LAB — TROPONIN I: Troponin I: 0.06

## 2011-07-18 LAB — PATHOLOGIST SMEAR REVIEW

## 2011-07-18 LAB — CK TOTAL AND CKMB (NOT AT ARMC): Total CK: 171

## 2011-07-21 LAB — POCT I-STAT, CHEM 8
Creatinine, Ser: 0.8
Hemoglobin: 14.6
Potassium: 3.8
Sodium: 143
TCO2: 29

## 2011-07-21 LAB — D-DIMER, QUANTITATIVE: D-Dimer, Quant: 1.98 — ABNORMAL HIGH

## 2011-07-21 LAB — PROTIME-INR: INR: 1

## 2011-07-22 LAB — POCT CARDIAC MARKERS
CKMB, poc: 1.6
Myoglobin, poc: 74.2
Troponin i, poc: <0.05

## 2011-07-22 LAB — DIFFERENTIAL
Basophils Relative: 0
Lymphocytes Relative: 52 — ABNORMAL HIGH
Lymphs Abs: 4.1 — ABNORMAL HIGH
Monocytes Absolute: 0.8
Monocytes Relative: 10
Neutro Abs: 3
Neutrophils Relative %: 38 — ABNORMAL LOW

## 2011-07-22 LAB — POCT I-STAT, CHEM 8
Chloride: 104
Glucose, Bld: 133 — ABNORMAL HIGH
HCT: 42
Hemoglobin: 14.3
Potassium: 3.7
Sodium: 140

## 2011-07-22 LAB — CBC
Hemoglobin: 13.2
MCHC: 33.5
RBC: 4.06
WBC: 8

## 2011-07-25 LAB — POCT CARDIAC MARKERS
CKMB, poc: 1.9
Myoglobin, poc: 96.4
Operator id: 146091
Troponin i, poc: <0.05

## 2011-08-07 LAB — I-STAT 8, (EC8 V) (CONVERTED LAB)
Acid-Base Excess: 2
BUN: 14
BUN: 16
Bicarbonate: 24.3 — ABNORMAL HIGH
Bicarbonate: 28.2 — ABNORMAL HIGH
Bicarbonate: 28.2 — ABNORMAL HIGH
Glucose, Bld: 122 — ABNORMAL HIGH
Glucose, Bld: 76
Hemoglobin: 14.3
Hemoglobin: 14.3
Potassium: 4
Sodium: 138
Sodium: 143
TCO2: 30
pCO2, Ven: 55.4 — ABNORMAL HIGH
pH, Ven: 7.315 — ABNORMAL HIGH
pH, Ven: 7.385 — ABNORMAL HIGH

## 2011-08-07 LAB — POCT I-STAT CREATININE
Creatinine, Ser: 0.8
Creatinine, Ser: 0.9
Operator id: 285491
Operator id: 294501

## 2011-08-07 LAB — DIFFERENTIAL
Basophils Absolute: 0
Basophils Absolute: 0.1
Basophils Relative: 1
Basophils Relative: 1
Eosinophils Absolute: 0.1
Eosinophils Absolute: 0.1
Eosinophils Relative: 1
Eosinophils Relative: 1
Eosinophils Relative: 2
Lymphocytes Relative: 42
Lymphs Abs: 3.7 — ABNORMAL HIGH
Monocytes Absolute: 1.1 — ABNORMAL HIGH
Monocytes Absolute: 1.1 — ABNORMAL HIGH
Monocytes Absolute: 1.1 — ABNORMAL HIGH
Monocytes Relative: 11
Neutro Abs: 3.9
Neutro Abs: 8.2 — ABNORMAL HIGH
Neutrophils Relative %: 51

## 2011-08-07 LAB — CBC
HCT: 37.3
HCT: 37.5
Hemoglobin: 12.6
Hemoglobin: 12.6
Hemoglobin: 12.9
MCHC: 33.3
MCHC: 33.6
MCV: 96
Platelets: 416 — ABNORMAL HIGH
RBC: 3.91
RDW: 14.5 — ABNORMAL HIGH
RDW: 14.5 — ABNORMAL HIGH
WBC: 10.3

## 2011-08-07 LAB — URINALYSIS, ROUTINE W REFLEX MICROSCOPIC
Bilirubin Urine: NEGATIVE
Glucose, UA: 100 — AB
Glucose, UA: NEGATIVE
Hgb urine dipstick: NEGATIVE
Ketones, ur: NEGATIVE
Nitrite: POSITIVE — AB
Protein, ur: NEGATIVE
Specific Gravity, Urine: 1.011
Specific Gravity, Urine: 1.022
Urobilinogen, UA: 0.2

## 2011-08-07 LAB — URINE CULTURE
Colony Count: >=100000
Culture: NO GROWTH

## 2011-08-07 LAB — CULTURE, BLOOD (ROUTINE X 2)

## 2011-08-08 LAB — URINE MICROSCOPIC-ADD ON

## 2011-08-08 LAB — I-STAT 8, (EC8 V) (CONVERTED LAB)
Chloride: 104
Glucose, Bld: 97
Hemoglobin: 15.3 — ABNORMAL HIGH
Potassium: 3.5
Sodium: 138
pH, Ven: 7.407 — ABNORMAL HIGH

## 2011-08-08 LAB — URINALYSIS, ROUTINE W REFLEX MICROSCOPIC
Nitrite: NEGATIVE
Specific Gravity, Urine: 1.02
pH: 5.5

## 2011-08-08 LAB — CBC
HCT: 39.9
Hemoglobin: 13.4
MCHC: 33.6
MCV: 95.9
Platelets: 372
RDW: 13.7

## 2011-08-08 LAB — D-DIMER, QUANTITATIVE: D-Dimer, Quant: 2.22 — ABNORMAL HIGH

## 2011-08-08 LAB — DIFFERENTIAL
Basophils Absolute: 0
Basophils Relative: 1
Eosinophils Absolute: 0
Eosinophils Relative: 0
Lymphocytes Relative: 59 — ABNORMAL HIGH
Monocytes Absolute: 0.9 — ABNORMAL HIGH

## 2011-08-08 LAB — POCT I-STAT CREATININE: Operator id: 257131

## 2011-09-11 ENCOUNTER — Encounter: Payer: Medicaid Other | Admitting: *Deleted

## 2011-09-17 ENCOUNTER — Encounter: Payer: Self-pay | Admitting: *Deleted

## 2011-09-27 ENCOUNTER — Emergency Department (HOSPITAL_COMMUNITY): Payer: Medicaid Other

## 2011-09-27 ENCOUNTER — Encounter (HOSPITAL_COMMUNITY): Payer: Self-pay | Admitting: Emergency Medicine

## 2011-09-27 ENCOUNTER — Observation Stay (HOSPITAL_COMMUNITY)
Admission: EM | Admit: 2011-09-27 | Discharge: 2011-09-28 | Disposition: A | Discharge status: Home / Self Care | Payer: Medicaid Other | Source: Ambulatory Visit | Attending: Internal Medicine | Admitting: Internal Medicine

## 2011-09-27 DIAGNOSIS — G5710 Meralgia paresthetica, unspecified lower limb: Principal | ICD-10-CM | POA: Diagnosis present

## 2011-09-27 DIAGNOSIS — M79606 Pain in leg, unspecified: Secondary | ICD-10-CM

## 2011-09-27 DIAGNOSIS — M79609 Pain in unspecified limb: Secondary | ICD-10-CM | POA: Insufficient documentation

## 2011-09-27 DIAGNOSIS — R0602 Shortness of breath: Secondary | ICD-10-CM | POA: Insufficient documentation

## 2011-09-27 LAB — POCT I-STAT, CHEM 8
Calcium, Ion: 1.12 mmol/L (ref 1.12–1.32)
Chloride: 104 mEq/L (ref 96–112)
Glucose, Bld: 86 mg/dL (ref 70–99)
HCT: 44 % (ref 36.0–46.0)
Hemoglobin: 15 g/dL (ref 12.0–15.0)
Potassium: 3.8 mEq/L (ref 3.5–5.1)

## 2011-09-27 LAB — CBC
HCT: 41.6 % (ref 36.0–46.0)
MCHC: 33.4 g/dL (ref 30.0–36.0)
MCV: 97 fL (ref 78.0–100.0)
Platelets: 330 10*3/uL (ref 150–400)
RDW: 14.8 % (ref 11.5–15.5)
WBC: 8.8 10*3/uL (ref 4.0–10.5)

## 2011-09-27 LAB — D-DIMER, QUANTITATIVE: D-Dimer, Quant: 2.45 ug/mL-FEU — ABNORMAL HIGH (ref 0.00–0.48)

## 2011-09-27 MED ORDER — HYDROMORPHONE HCL PF 1 MG/ML IJ SOLN
1.0000 mg | Freq: Once | INTRAMUSCULAR | Status: AC
  Administered 2011-09-28: 1 mg via INTRAVENOUS
  Filled 2011-09-27: qty 1

## 2011-09-27 MED ORDER — ONDANSETRON HCL 4 MG/2ML IJ SOLN
4.0000 mg | Freq: Once | INTRAMUSCULAR | Status: AC
  Administered 2011-09-28: 4 mg via INTRAVENOUS
  Filled 2011-09-27: qty 2

## 2011-09-27 NOTE — ED Provider Notes (Signed)
History     CSN: [id number] Arrival date & time: 09/27/2011  5:30 PM   None     Chief Complaint  Patient presents with  . Leg Pain    Right leg  . Shortness of Breath    (Consider location/radiation/quality/duration/timing/severity/associated sxs/prior treatment) HPI  Past Medical History  Diagnosis Date  . Chest pain   . Anxiety   . Depression   . UTI (urinary tract infection)   . Obesity   . Sinus node dysfunction      status post placement of    Medtronic pacemaker by Dr. Lewayne Bunting in 2009.   Marland Kitchen HTN (hypertension)   . Obesity   . Cystitis   . Palpitations     Past Surgical History  Procedure Date  . Pacemaker placement   . Appendectomy   . Cholecystectomy   . Splenectomy, total     History reviewed. No pertinent family history.  History  Substance Use Topics  . Smoking status: Never Smoker   . Smokeless tobacco: Never Used  . Alcohol Use: No    OB History    Grav Para Term Preterm Abortions TAB SAB Ect Mult Living                  Review of Systems  Allergies  Aliskiren fumarate; Codeine; Eszopiclone; Iohexol; Labetalol hcl; Penicillins; Topamax; and Vancomycin  Home Medications   Current Outpatient Rx  Name Route Sig Dispense Refill  . ASPIRIN 81 MG PO TABS Oral Take 81 mg by mouth daily.      Marland Kitchen CLONAZEPAM 1 MG PO TABS Oral Take 1 mg by mouth 2 (two) times daily.      Marland Kitchen METOPROLOL TARTRATE 50 MG PO TABS Oral Take 50 mg by mouth 2 (two) times daily.      . MORPHINE SULFATE ER 15 MG PO TB12 Oral Take 15 mg by mouth 2 (two) times daily.      Marland Kitchen PROMETHAZINE HCL 25 MG PO TABS Oral Take 25 mg by mouth every 6 (six) hours as needed.      Marland Kitchen TRAMADOL HCL 50 MG PO TABS Oral Take 50-100 mg by mouth daily as needed. Maximum dose= 8 tablets per day     . TRAZODONE HCL 100 MG PO TABS Oral Take 100 mg by mouth at bedtime.     Marland Kitchen VITAMIN B-12 1000 MCG PO TABS Oral Take 1,000 mcg by mouth daily.        BP 123/66  Pulse 72  Temp(Src) 98.1 F (36.7 C)  (Oral)  Resp 15  SpO2 100%  Physical Exam  ED Course  Procedures (including critical care time)  Labs Reviewed  D-DIMER, QUANTITATIVE - Abnormal; Notable for the following:    D-Dimer, Quant 2.45 (*)    All other components within normal limits  CBC  POCT I-STAT, CHEM 8  I-STAT, CHEM 8   Dg Chest 2 View  09/27/2011  *RADIOLOGY REPORT*  Clinical Data: Shortness of breath, chest pain and right leg swelling.  CHEST - 2 VIEW  Comparison: Chest radiograph performed 03/20/2011  Findings: The lungs are relatively well-aerated.  Mild left basilar scarring or chronic atelectasis is noted.  There is no evidence of acute focal opacification, pleural effusion or pneumothorax.  Focal elevation of the right hemidiaphragm appears stable from the prior study.  The heart is normal in size; a pacemaker is noted at the left chest wall, with leads ending at the right atrium and right ventricle. No acute  osseous abnormalities are seen.  Thoracolumbar spinal fusion hardware is partially imaged. Scattered clips are noted about the upper abdomen.  There is a stable compression deformity at the upper lumbar spine.  IMPRESSION: No acute cardiopulmonary process seen; mild chronic left basilar atelectasis or scarring noted.  Original Report Authenticated By: Tonia Ghent, M.D.     No diagnosis found.    MDM  Not my patient. I did not see her        Doug Sou, MD 09/27/11 [phone]

## 2011-09-27 NOTE — ED Provider Notes (Addendum)
History     CSN: [id number] Arrival date & time: 09/27/2011  5:30 PM   First MD Initiated Contact with Patient 09/27/11 2351      Chief Complaint  Patient presents with  . Leg Pain    Right leg  . Shortness of Breath    (Consider location/radiation/quality/duration/timing/severity/associated sxs/prior treatment) Patient is a 55 y.o. female presenting with leg pain and shortness of breath. The history is provided by the patient.  Leg Pain  The incident occurred more than 1 week ago. The incident occurred at home. There was no injury mechanism. The pain is present in the right thigh. The quality of the pain is described as aching. The pain is moderate. The pain has been constant since onset. Pertinent negatives include no numbness, no inability to bear weight, no loss of motion, no muscle weakness, no loss of sensation and no tingling. She reports no foreign bodies present. The symptoms are aggravated by activity and bearing weight. She has tried nothing (She takes morphine for chronic back pain) for the symptoms.  Shortness of Breath  Associated symptoms include shortness of breath. Pertinent negatives include no chest pain and no fever.   no chest pain. Has remote history of DVT. No history of PE. His not on Coumadin. No rash or redness to leg. No known trauma. No weakness or numbness. Moderate in severity. No radiation of pain. Constant since onset over a week ago.  Past Medical History  Diagnosis Date  . Chest pain   . Anxiety   . Depression   . UTI (urinary tract infection)   . Obesity   . Sinus node dysfunction      status post placement of    Medtronic pacemaker by Dr. Lewayne Bunting in 2009.   Marland Kitchen HTN (hypertension)   . Obesity   . Cystitis   . Palpitations     Past Surgical History  Procedure Date  . Pacemaker placement   . Appendectomy   . Cholecystectomy   . Splenectomy, total     History reviewed. No pertinent family history.  History  Substance Use Topics  .  Smoking status: Never Smoker   . Smokeless tobacco: Never Used  . Alcohol Use: No    OB History    Grav Para Term Preterm Abortions TAB SAB Ect Mult Living                  Review of Systems  Constitutional: Negative for fever and chills.  HENT: Negative for neck pain and neck stiffness.   Eyes: Negative for pain.  Respiratory: Positive for shortness of breath.   Cardiovascular: Positive for leg swelling. Negative for chest pain and palpitations.  Gastrointestinal: Negative for abdominal pain.  Genitourinary: Negative for dysuria.  Musculoskeletal: Negative for back pain.  Skin: Negative for rash.  Neurological: Negative for tingling, numbness and headaches.  All other systems reviewed and are negative.    Allergies  Aliskiren fumarate; Codeine; Eszopiclone; Iohexol; Labetalol hcl; Penicillins; Topamax; and Vancomycin  Home Medications   Current Outpatient Rx  Name Route Sig Dispense Refill  . ASPIRIN 81 MG PO TABS Oral Take 81 mg by mouth daily.      Marland Kitchen CLONAZEPAM 1 MG PO TABS Oral Take 1 mg by mouth 2 (two) times daily.      Marland Kitchen METOPROLOL TARTRATE 50 MG PO TABS Oral Take 50 mg by mouth 2 (two) times daily.      . MORPHINE SULFATE ER 15 MG PO TB12 Oral  Take 15 mg by mouth 2 (two) times daily.      Marland Kitchen PROMETHAZINE HCL 25 MG PO TABS Oral Take 25 mg by mouth every 6 (six) hours as needed.      Marland Kitchen TRAMADOL HCL 50 MG PO TABS Oral Take 50-100 mg by mouth daily as needed. Maximum dose= 8 tablets per day     . TRAZODONE HCL 100 MG PO TABS Oral Take 100 mg by mouth at bedtime.     Marland Kitchen VITAMIN B-12 1000 MCG PO TABS Oral Take 1,000 mcg by mouth daily.        BP 123/66  Pulse 72  Temp(Src) 98.1 F (36.7 C) (Oral)  Resp 15  SpO2 100%  Physical Exam  Constitutional: She is oriented to person, place, and time. She appears well-developed and well-nourished.  HENT:  Head: Normocephalic and atraumatic.  Eyes: Conjunctivae and EOM are normal. Pupils are equal, round, and reactive to  light.  Neck: Trachea normal. Neck supple. No thyromegaly present.  Cardiovascular: Normal rate, regular rhythm, S1 normal, S2 normal and normal pulses.     No systolic murmur is present   No diastolic murmur is present  Pulses:      Radial pulses are 2+ on the right side, and 2+ on the left side.  Pulmonary/Chest: Effort normal and breath sounds normal. She has no wheezes. She has no rhonchi. She has no rales. She exhibits no tenderness.  Abdominal: Soft. Normal appearance and bowel sounds are normal. There is no tenderness. There is no CVA tenderness and negative Murphy's sign.  Musculoskeletal:       Right lower extremity: Significant swelling to thigh and calf versus left lower extremity, mild tenderness to palpation calf and thigh without palpable cords. Negative Homans sign. Distal neurovascular is intact. Left lower extremity no abnormalities.  Neurological: She is alert and oriented to person, place, and time. She has normal strength. No cranial nerve deficit or sensory deficit. GCS eye subscore is 4. GCS verbal subscore is 5. GCS motor subscore is 6.  Skin: Skin is warm and dry. No rash noted. She is not diaphoretic.  Psychiatric: Her speech is normal.       Cooperative and appropriate    ED Course  Procedures (including critical care time)  Labs Reviewed  D-DIMER, QUANTITATIVE - Abnormal; Notable for the following:    D-Dimer, Quant 2.45 (*)    All other components within normal limits  CBC  POCT I-STAT, CHEM 8  I-STAT, CHEM 8   Dg Chest 2 View  09/27/2011  *RADIOLOGY REPORT*  Clinical Data: Shortness of breath, chest pain and right leg swelling.  CHEST - 2 VIEW  Comparison: Chest radiograph performed 03/20/2011  Findings: The lungs are relatively well-aerated.  Mild left basilar scarring or chronic atelectasis is noted.  There is no evidence of acute focal opacification, pleural effusion or pneumothorax.  Focal elevation of the right hemidiaphragm appears stable from the  prior study.  The heart is normal in size; a pacemaker is noted at the left chest wall, with leads ending at the right atrium and right ventricle. No acute osseous abnormalities are seen.  Thoracolumbar spinal fusion hardware is partially imaged. Scattered clips are noted about the upper abdomen.  There is a stable compression deformity at the upper lumbar spine.  IMPRESSION: No acute cardiopulmonary process seen; mild chronic left basilar atelectasis or scarring noted.  Original Report Authenticated By: Tonia Ghent, M.D.    Right lower extremity swelling with history of  DVT will evaluate the same. PE study requested for shortness of breath with DVT symptoms. Elevated d-dimer as above. Patient is sent for a CT scan it was noted to have iodine allergy. Medicine consultation for admit nuclear medicine study in a.m. Lovenox initiated.   discussed with Dr. Lovell Sheehan as above plan medical admission to telemetry   MDM  DVT symptoms with shortness of breath, will will need PE study. Lovenox given. No DVT study available at this hour, will admit for diagnostics and treatment as inpatient        Sunnie Nielsen, MD 09/28/11 0148  Sunnie Nielsen, MD 09/28/11 0700

## 2011-09-27 NOTE — ED Notes (Signed)
Pt reports right leg pain for a couple of weeks. Pt also reports swelling to right leg and shortness of breath.

## 2011-09-28 ENCOUNTER — Emergency Department (HOSPITAL_COMMUNITY): Payer: Medicaid Other

## 2011-09-28 DIAGNOSIS — G5710 Meralgia paresthetica, unspecified lower limb: Secondary | ICD-10-CM | POA: Diagnosis present

## 2011-09-28 DIAGNOSIS — M79609 Pain in unspecified limb: Secondary | ICD-10-CM

## 2011-09-28 LAB — DIC (DISSEMINATED INTRAVASCULAR COAGULATION)PANEL
D-Dimer, Quant: 2.38 ug/mL-FEU — ABNORMAL HIGH (ref 0.00–0.48)
Fibrinogen: 529 mg/dL — ABNORMAL HIGH (ref 204–475)
INR: 1.06 (ref 0.00–1.49)
Prothrombin Time: 14 seconds (ref 11.6–15.2)
aPTT: 29 seconds (ref 24–37)

## 2011-09-28 MED ORDER — CARBAMAZEPINE 200 MG PO TABS: 200.0000 mg | ORAL_TABLET | Freq: Every day | ORAL | Status: DC

## 2011-09-28 MED ORDER — PROMETHAZINE HCL 25 MG PO TABS: 25.0000 mg | ORAL_TABLET | Freq: Four times a day (QID) | ORAL | Status: DC | PRN

## 2011-09-28 MED ORDER — ACETAMINOPHEN 325 MG PO TABS: 650.0000 mg | ORAL_TABLET | Freq: Four times a day (QID) | ORAL | Status: DC | PRN

## 2011-09-28 MED ORDER — ENOXAPARIN SODIUM 120 MG/0.8ML ~~LOC~~ SOLN
115.0000 mg | Freq: Two times a day (BID) | SUBCUTANEOUS | Status: DC
  Filled 2011-09-28 (×2): qty 0.8

## 2011-09-28 MED ORDER — ONDANSETRON HCL 4 MG PO TABS: 4.0000 mg | ORAL_TABLET | Freq: Four times a day (QID) | ORAL | Status: DC | PRN

## 2011-09-28 MED ORDER — ENOXAPARIN SODIUM 120 MG/0.8ML ~~LOC~~ SOLN
1.0000 mg/kg | Freq: Two times a day (BID) | SUBCUTANEOUS | Status: DC
  Administered 2011-09-28: 115 mg via SUBCUTANEOUS
  Filled 2011-09-28 (×2): qty 0.8

## 2011-09-28 MED ORDER — CLONAZEPAM 1 MG PO TABS
1.0000 mg | ORAL_TABLET | Freq: Two times a day (BID) | ORAL | Status: DC
  Administered 2011-09-28 (×2): 1 mg via ORAL
  Filled 2011-09-28: qty 1
  Filled 2011-09-28: qty 2

## 2011-09-28 MED ORDER — ALUM & MAG HYDROXIDE-SIMETH 200-200-20 MG/5ML PO SUSP: 30.0000 mL | Freq: Four times a day (QID) | ORAL | Status: DC | PRN

## 2011-09-28 MED ORDER — ENOXAPARIN SODIUM 80 MG/0.8ML ~~LOC~~ SOLN
1.0000 mg/kg | Freq: Two times a day (BID) | SUBCUTANEOUS | Status: DC
  Filled 2011-09-28: qty 0.3

## 2011-09-28 MED ORDER — MORPHINE SULFATE CR 15 MG PO TB12
15.0000 mg | ORAL_TABLET | Freq: Two times a day (BID) | ORAL | Status: DC
  Administered 2011-09-28 (×2): 15 mg via ORAL
  Filled 2011-09-28: qty 1

## 2011-09-28 MED ORDER — ONDANSETRON HCL 4 MG/2ML IJ SOLN: 4.0000 mg | Freq: Four times a day (QID) | INTRAMUSCULAR | Status: DC | PRN

## 2011-09-28 MED ORDER — ASPIRIN 81 MG PO TABS: 81.0000 mg | ORAL_TABLET | Freq: Every day | ORAL | Status: DC

## 2011-09-28 MED ORDER — ENOXAPARIN SODIUM 120 MG/0.8ML ~~LOC~~ SOLN
1.0000 mg/kg | SUBCUTANEOUS | Status: AC
  Administered 2011-09-28: 120 mg via SUBCUTANEOUS
  Filled 2011-09-28: qty 0.8

## 2011-09-28 MED ORDER — MORPHINE SULFATE CR 15 MG PO TB12
15.0000 mg | ORAL_TABLET | ORAL | Status: AC
  Filled 2011-09-28: qty 1

## 2011-09-28 MED ORDER — PROMETHAZINE HCL 25 MG/ML IJ SOLN
25.0000 mg | INTRAMUSCULAR | Status: DC
  Filled 2011-09-28: qty 1

## 2011-09-28 MED ORDER — TECHNETIUM TO 99M ALBUMIN AGGREGATED
6.0000 | Freq: Once | INTRAVENOUS | Status: AC | PRN
  Administered 2011-09-28: 6 via INTRAVENOUS

## 2011-09-28 MED ORDER — HYDROMORPHONE HCL PF 1 MG/ML IJ SOLN: 0.5000 mg | INTRAMUSCULAR | Status: DC | PRN

## 2011-09-28 MED ORDER — OXYCODONE HCL 5 MG PO TABS
5.0000 mg | ORAL_TABLET | ORAL | Status: DC | PRN
  Administered 2011-09-28 (×2): 5 mg via ORAL
  Filled 2011-09-28 (×3): qty 1

## 2011-09-28 MED ORDER — SODIUM CHLORIDE 0.9 % IV SOLN
Freq: Once | INTRAVENOUS | Status: AC
  Administered 2011-09-28: 1000 mL via INTRAVENOUS

## 2011-09-28 MED ORDER — OXYCODONE HCL 5 MG PO TABS: 5.0000 mg | ORAL_TABLET | ORAL | Status: DC | PRN

## 2011-09-28 MED ORDER — XENON XE 133 GAS
10.0000 | GAS_FOR_INHALATION | Freq: Once | RESPIRATORY_TRACT | Status: AC | PRN
  Administered 2011-09-28: 10 via RESPIRATORY_TRACT

## 2011-09-28 MED ORDER — ZOLPIDEM TARTRATE 5 MG PO TABS: 5.0000 mg | ORAL_TABLET | Freq: Every evening | ORAL | Status: DC | PRN

## 2011-09-28 MED ORDER — ACETAMINOPHEN 650 MG RE SUPP: 650.0000 mg | Freq: Four times a day (QID) | RECTAL | Status: DC | PRN

## 2011-09-28 MED ORDER — ALUM & MAG HYDROXIDE-SIMETH 200-200-20 MG/5ML PO SUSP
30.0000 mL | Freq: Four times a day (QID) | ORAL | Status: DC | PRN
  Filled 2011-09-28: qty 30

## 2011-09-28 MED ORDER — TRAZODONE HCL 100 MG PO TABS
100.0000 mg | ORAL_TABLET | ORAL | Status: AC
  Administered 2011-09-28: 100 mg via ORAL
  Filled 2011-09-28: qty 1

## 2011-09-28 MED ORDER — SODIUM CHLORIDE 0.9 % IV SOLN: 250.0000 mL | INTRAVENOUS | Status: DC | PRN

## 2011-09-28 MED ORDER — VITAMIN B-12 1000 MCG PO TABS
1000.0000 ug | ORAL_TABLET | Freq: Every day | ORAL | Status: DC
  Administered 2011-09-28: 1000 ug via ORAL
  Filled 2011-09-28: qty 1

## 2011-09-28 MED ORDER — ENOXAPARIN SODIUM 120 MG/0.8ML ~~LOC~~ SOLN
1.0000 mg/kg | Freq: Two times a day (BID) | SUBCUTANEOUS | Status: DC
  Filled 2011-09-28: qty 0.8

## 2011-09-28 MED ORDER — TRAZODONE HCL 100 MG PO TABS
100.0000 mg | ORAL_TABLET | Freq: Every day | ORAL | Status: DC
  Filled 2011-09-28: qty 1

## 2011-09-28 MED ORDER — SODIUM CHLORIDE 0.9 % IJ SOLN
3.0000 mL | Freq: Two times a day (BID) | INTRAMUSCULAR | Status: DC
Start: 2011-09-28 — End: 2011-09-28

## 2011-09-28 MED ORDER — ONDANSETRON HCL 8 MG PO TABS: 4.0000 mg | ORAL_TABLET | Freq: Four times a day (QID) | ORAL | Status: DC | PRN

## 2011-09-28 MED ORDER — CARBAMAZEPINE 200 MG PO TABS
200.0000 mg | ORAL_TABLET | Freq: Every day | ORAL | Status: DC
Start: 2011-09-28 — End: 2011-09-28
  Filled 2011-09-28: qty 1

## 2011-09-28 MED ORDER — SODIUM CHLORIDE 0.9 % IJ SOLN
3.0000 mL | Freq: Two times a day (BID) | INTRAMUSCULAR | Status: DC
  Administered 2011-09-28: 3 mL via INTRAVENOUS

## 2011-09-28 MED ORDER — TRAMADOL HCL 50 MG PO TABS
50.0000 mg | ORAL_TABLET | Freq: Four times a day (QID) | ORAL | Status: DC | PRN
  Filled 2011-09-28 (×2): qty 1

## 2011-09-28 MED ORDER — ASPIRIN 81 MG PO CHEW
CHEWABLE_TABLET | ORAL | Status: AC
  Administered 2011-09-28: 81 mg
  Filled 2011-09-28: qty 1

## 2011-09-28 MED ORDER — SODIUM CHLORIDE 0.9 % IJ SOLN: 3.0000 mL | INTRAMUSCULAR | Status: DC | PRN

## 2011-09-28 MED ORDER — CLONAZEPAM 1 MG PO TABS: 1.0000 mg | ORAL_TABLET | Freq: Two times a day (BID) | ORAL | Status: DC

## 2011-09-28 MED ORDER — ASPIRIN 81 MG PO CHEW
81.0000 mg | CHEWABLE_TABLET | Freq: Every day | ORAL | Status: DC
  Administered 2011-09-28: 81 mg via ORAL
  Filled 2011-09-28: qty 1

## 2011-09-28 MED ORDER — METOPROLOL TARTRATE 50 MG PO TABS
50.0000 mg | ORAL_TABLET | Freq: Two times a day (BID) | ORAL | Status: DC
  Administered 2011-09-28 (×2): 50 mg via ORAL
  Filled 2011-09-28: qty 2
  Filled 2011-09-28: qty 1

## 2011-09-28 NOTE — H&P (Addendum)
DATE OF ADMISSION:  09/28/2011  PCP:    Crawford Givens, MD, MD   Chief Complaint:   HPI: Susan Howell is an 55 y.o. female who presents with complaints of increased swelling and pain in her Right lower leg and SOB X 2 days.  She has a remote history of a Pulmonary embolism which followed surgery in the 1970s.  She had coumadin therapy at the time. She denies chest pain, and denies any family history of clotting disorders.  She was found on the ED evaluation to have an elevated D-Dimer of 2.5, however was not able to have a CTA of the chest performed due to an allergy to IVP Dye.  She reports that she has a chronically elevated D-Dimer. She also has fibromyalgia and DDD and chronic pain.        Past Medical History  Diagnosis Date  . Chest pain   . Anxiety   . Depression   . UTI (urinary tract infection)   . Obesity   . Sinus node dysfunction      status post placement of    Medtronic pacemaker by Dr. Lewayne Bunting in 2009.   Marland Kitchen HTN (hypertension)   . Obesity   . Cystitis   . Palpitations         Osteoporosis       Fibromyalgia  Past Surgical History  Procedure Date  . Pacemaker placement   . Appendectomy   . Cholecystectomy   . Splenectomy, total        Hysterectomy      Ectopic Pregnancy      Ganglion Cyst Excisons      Spinal Surgery (ROD Placement)      Gastric Bypass/Wrap with Complications       Cardiac Catherizations (Cards:  [facility])   Medications:  HOME MEDS: Prior to Admission medications   Medication Sig Start Date End Date Taking? Authorizing Provider  aspirin 81 MG tablet Take 81 mg by mouth daily.     Yes Historical Provider, MD  clonazePAM (KLONOPIN) 1 MG tablet Take 1 mg by mouth 2 (two) times daily.     Yes Historical Provider, MD  metoprolol (LOPRESSOR) 50 MG tablet Take 50 mg by mouth 2 (two) times daily.     Yes Historical Provider, MD  morphine (MS CONTIN) 15 MG 12 hr tablet Take 15 mg by mouth 2 (two) times daily.     Yes Historical Provider,  MD  promethazine (PHENERGAN) 25 MG tablet Take 25 mg by mouth every 6 (six) hours as needed.     Yes Historical Provider, MD  traMADol (ULTRAM) 50 MG tablet Take 50-100 mg by mouth daily as needed. Maximum dose= 8 tablets per day    Yes Historical Provider, MD  traZODone (DESYREL) 100 MG tablet Take 100 mg by mouth at bedtime.    Yes Historical Provider, MD  vitamin B-12 (CYANOCOBALAMIN) 1000 MCG tablet Take 1,000 mcg by mouth daily.     Yes Historical Provider, MD    Allergies:  Allergies  Allergen Reactions  . Aliskiren Fumarate     REACTION: ANXIETY // SYNCOPE  . Codeine   . Eszopiclone     REACTION: NIGHT MARES  . Iohexol      Desc: NAUSEA,VOMITING & SZ S/P CONTRAST INJ 30 YRS AGO// OK W/ PRE MEDS, BENADRYL & SOLUMEDROL TODAY//A.C., Onset Date: [id number]   . Labetalol Hcl   . Penicillins   . Topamax Other (See Comments)    Confusion, high blood  pressure.   . Vancomycin     Social History:   reports that she has never smoked. She has never used smokeless tobacco. She reports that she does not drink alcohol or use illicit drugs.  Family History: History reviewed. No pertinent family history.  Review of Systems:  The patient denies anorexia, fever, weight loss, vision loss, decreased hearing, hoarseness, chest pain, syncope, dyspnea on exertion, peripheral edema, balance deficits, hemoptysis, abdominal pain, melena, hematochezia, severe indigestion/heartburn, hematuria, incontinence, genital sores, muscle weakness, suspicious skin lesions, transient blindness, difficulty walking, depression, unusual weight change, abnormal bleeding, enlarged lymph nodes, angioedema, and breast masses.    Physical Exam:  GEN:  Pleasant 55 year old Morbidly Obese Caucasian female  examined  and in no acute distress; cooperative with exam Filed Vitals:   09/27/11 1730 09/28/11 0138 09/28/11 0323  BP: 123/66 150/84   Pulse: 72 85   Temp: 98.1 F (36.7 C) 97.5 F (36.4 C)   TempSrc: Oral      Resp: 15 18   Weight:   112.492 kg (248 lb)  SpO2: 100% 100%    Blood pressure 150/84, pulse 85, temperature 97.5 F (36.4 C), temperature source Oral, resp. rate 18, weight 112.492 kg (248 lb), SpO2 100.00%. PSYCH: SHe is alert and oriented x4; does not appear anxious does not appear depressed; affect is normal HEENT: Normocephalic and Atraumatic, Mucous membranes pink; PERRLA; EOM intact; Fundi:  Benign;  No scleral icterus, Nares: Patent, Oropharynx: Clear, Fair Dentition, Neck:  FROM, no cervical lymphadenopathy nor thyromegaly or carotid bruit; no JVD; Breasts:: Not examined CHEST WALL: No tenderness CHEST: Normal respiration, clear to auscultation bilaterally HEART: Regular rate and rhythm; no murmurs rubs or gallops BACK: No kyphosis or scoliosis; no CVA tenderness ABDOMEN: Positive Bowel Sounds, Obese, soft non-tender; no masses, no organomegaly, +pannus; no intertriginous candida. Rectal Exam: Not done EXTREMITIES: No bone or joint deformity; age-appropriate arthropathy of the hands and knees; no cyanosis, clubbing, RLE 2+ Edema present; no ulcerations. Genitalia: not examined PULSES: 2+ and symmetric SKIN: Normal hydration no rash or ulceration CNS: Cranial nerves 2-12 grossly intact no focal neurologic deficit   Labs & Imaging Results for orders placed during the hospital encounter of 09/27/11 (from the past 48 hour(s))  D-DIMER, QUANTITATIVE     Status: Abnormal   Collection Time   09/27/11  9:54 PM      Component Value Range Comment   D-Dimer, Quant 2.45 (*) 0.00 - 0.48 (ug/mL-FEU)   CBC     Status: Normal   Collection Time   09/27/11  9:54 PM      Component Value Range Comment   WBC 8.8  4.0 - 10.5 (K/uL)    RBC 4.29  3.87 - 5.11 (MIL/uL)    Hemoglobin 13.9  12.0 - 15.0 (g/dL)    HCT 57.8  46.9 - 62.9 (%)    MCV 97.0  78.0 - 100.0 (fL)    MCH 32.4  26.0 - 34.0 (pg)    MCHC 33.4  30.0 - 36.0 (g/dL)    RDW 52.8  41.3 - 24.4 (%)    Platelets 330  150 - 400 (K/uL)    POCT I-STAT, CHEM 8     Status: Normal   Collection Time   09/27/11 10:20 PM      Component Value Range Comment   Sodium 143  135 - 145 (mEq/L)    Potassium 3.8  3.5 - 5.1 (mEq/L)    Chloride 104  96 - 112 (mEq/L)  BUN 12  6 - 23 (mg/dL)    Creatinine, Ser 1.47  0.50 - 1.10 (mg/dL)    Glucose, Bld 86  70 - 99 (mg/dL)    Calcium, Ion 8.29  1.12 - 1.32 (mmol/L)    TCO2 28  0 - 100 (mmol/L)    Hemoglobin 15.0  12.0 - 15.0 (g/dL)    HCT 56.2  13.0 - 86.5 (%)    Dg Chest 2 View  09/27/2011  *RADIOLOGY REPORT*  Clinical Data: Shortness of breath, chest pain and right leg swelling.  CHEST - 2 VIEW  Comparison: Chest radiograph performed 03/20/2011  Findings: The lungs are relatively well-aerated.  Mild left basilar scarring or chronic atelectasis is noted.  There is no evidence of acute focal opacification, pleural effusion or pneumothorax.  Focal elevation of the right hemidiaphragm appears stable from the prior study.  The heart is normal in size; a pacemaker is noted at the left chest wall, with leads ending at the right atrium and right ventricle. No acute osseous abnormalities are seen.  Thoracolumbar spinal fusion hardware is partially imaged. Scattered clips are noted about the upper abdomen.  There is a stable compression deformity at the upper lumbar spine.  IMPRESSION: No acute cardiopulmonary process seen; mild chronic left basilar atelectasis or scarring noted.  Original Report Authenticated By: Tonia Ghent, M.D.    Assessment:   1.  SOB R/O PE 2.  Right Lower Extremity Edema   R/O DVT 3.  Sinus Node Dysfunction S/P Pacer 4.  DDD 5. Chronic Pain Syndrome 6.  Morbid Obesity  Plan:   PE and DVT workup initiated, and A DIC Panel has been ordered to evaluate for a Clotting disorder, however the etiology may be due to patient being somewhat sedentary due to her chronic back pain.  Full dose lovenox has been ordered and further therapies will be initiated if patient rule in for a  thrombus at either site.  Her O2 sats will be monitored and supplemental oxygen will be given PRN.  A Venous duplex ultrasound study will be ordered of the Right lower extremity. And her regular meds have been reconciled.  Other plans as per orders.    CODE STATUS:      FULL CODE      Susan Howell C 09/28/2011, 3:30 AM

## 2011-09-28 NOTE — ED Notes (Signed)
Vascular Tech called when RN giving report to floor and advised she is on her way to perform doppler of pt's leg.  When RN attempting to transport pt to floor - Vascular Tech arrived - will transport pt when completed.

## 2011-09-28 NOTE — Progress Notes (Signed)
*  PRELIMINARY RESULTS* Right lower extremity venous dopplers performed. No obvious evidence of DVT, superficial thrombus, or Baker's Cyst noted. Susan Howell 09/28/2011, 10:26 AM

## 2011-09-28 NOTE — ED Notes (Signed)
Pt returns from CT, is noted to be allergic to iodine. Will discuss options with ARNP.

## 2011-09-28 NOTE — ED Notes (Signed)
Pharmacy advised is sending meds to [id number].

## 2011-09-28 NOTE — ED Notes (Signed)
Transported to [facility]

## 2011-09-28 NOTE — Discharge Summary (Signed)
Physician Discharge Summary  Patient ID: Susan Howell MRN: [id number] DOB/AGE: [birth year] 55 y.o. Primary Care Physician:No primary provider on file. Admit date: 09/27/2011 Discharge date: 09/29/2011  The patient has a primary care physician at the [place] [facility] [place] at [facility] - therefore she is unassigned for [employer] [facility] [employer] healthcare admissions  Discharge Diagnoses:  Principal Problem:  *Meralgia paresthetica  Chronic diagnosis which were not addressed during this admission Hyperlipidemia Bipolar disorder Obstructive sleep apnea Obesity History of pulmonary embolism History of urinary tract infection History of spinal stenosis Discharge Medication List as of 09/28/2011  3:50 PM    START taking these medications   Details  carbamazepine (TEGRETOL) 200 MG tablet Take 1 tablet (200 mg total) by mouth at bedtime., Starting 09/28/2011, Until Mon 09/27/12, Print      CONTINUE these medications which have CHANGED   Details  clonazePAM (KLONOPIN) 1 MG tablet Take 1 tablet (1 mg total) by mouth 2 (two) times daily., Starting 09/28/2011, Until Discontinued, Print      CONTINUE these medications which have NOT CHANGED   Details  aspirin 81 MG tablet Take 81 mg by mouth daily.  , Until Discontinued, Historical Med    metoprolol (LOPRESSOR) 50 MG tablet Take 50 mg by mouth 2 (two) times daily.  , Until Discontinued, Historical Med    morphine (MS CONTIN) 15 MG 12 hr tablet Take 15 mg by mouth 2 (two) times daily.  , Until Discontinued, Historical Med    promethazine (PHENERGAN) 25 MG tablet Take 25 mg by mouth every 6 (six) hours as needed.  , Until Discontinued, Historical Med    traMADol (ULTRAM) 50 MG tablet Take 50-100 mg by mouth daily as needed. Maximum dose= 8 tablets per day , Until Discontinued, Historical Med    traZODone (DESYREL) 100 MG tablet Take 100 mg by mouth at bedtime. , Until Discontinued, Historical Med    vitamin B-12 (CYANOCOBALAMIN) 1000 MCG tablet  Take 1,000 mcg by mouth daily.  , Until Discontinued, Historical Med        Discharged Condition: Good    Consults: None  Significant Diagnostic Studies: Dg Chest 2 View  09/27/2011  *RADIOLOGY REPORT*  Clinical Data: Shortness of breath, chest pain and right leg swelling.  CHEST - 2 VIEW  Comparison: Chest radiograph performed 03/20/2011  Findings: The lungs are relatively well-aerated.  Mild left basilar scarring or chronic atelectasis is noted.  There is no evidence of acute focal opacification, pleural effusion or pneumothorax.  Focal elevation of the right hemidiaphragm appears stable from the prior study.  The heart is normal in size; a pacemaker is noted at the left chest wall, with leads ending at the right atrium and right ventricle. No acute osseous abnormalities are seen.  Thoracolumbar spinal fusion hardware is partially imaged. Scattered clips are noted about the upper abdomen.  There is a stable compression deformity at the upper lumbar spine.  IMPRESSION: No acute cardiopulmonary process seen; mild chronic left basilar atelectasis or scarring noted.  Original Report Authenticated By: Tonia Ghent, M.D.   Nm Pulmonary Per & Vent  09/28/2011  *RADIOLOGY REPORT*  Clinical Data:  Elevated D-dimer and evaluate for pulmonary embolism.  NUCLEAR MEDICINE VENTILATION - PERFUSION LUNG SCAN  Technique:  Wash-in, equilibrium, and wash-out phase ventilation images were obtained using Xe-133 gas.  Perfusion images were obtained in multiple projections after intravenous injection of Tc- 36m MAA.  Radiopharmaceuticals:  10 mCi Xe-133 gas and 6 mCi Tc-27m MAA.  Comparison:  Chest radiograph 09/27/2011 and chest CT from 08/17/2009  Findings: There is a photopenic defect related to the cardiac pacemaker.  No evidence for a ventilation and perfusion mismatch. No significant perfusion abnormality. No significant air trapping on the ventilation images.  IMPRESSION: Very low probability for pulmonary  embolism.  Original Report Authenticated By: Richarda Overlie, M.D.    Lab Results: Basic Metabolic Panel:  Basename 09/27/11 2220  NA 143  K 3.8  CL 104  CO2 --  GLUCOSE 86  BUN 12  CREATININE 0.90  CALCIUM --  MG --  PHOS --   Liver Function Tests: No results found for this basename: AST:2,ALT:2,ALKPHOS:2,BILITOT:2,PROT:2,ALBUMIN:2 in the last 72 hours   CBC:  Basename 09/28/11 0340 09/27/11 2220 09/27/11 2154  WBC -- -- 8.8  NEUTROABS -- -- --  HGB -- 15.0 13.9  HCT -- 44.0 41.6  MCV -- -- 97.0  PLT 312 -- 330   Right lower extremity venous dopplers performed. No obvious evidence of DVT, superficial thrombus, or Baker's Cyst noted.  Josephine Igo  09/28/2011, 10:26 AM    Hospital Course:  [name] was placed on observation from the emergency room on September 28, 2011. She presented with some right lower extremity edema pain and maybe increased shortness of breath. The patient was placed on observation with possible diagnoses of DVT or PE. A ventilation perfusion scan returned a very low probability for pulmonary emboli. Furthermore when asking the patient she said she was not that more short of breath. Hands clinically and radiologically I doubt that this patient has pulmonary emboli. In regards to the deep vein thromboses the patient underwent a venous ultrasound which was negative for any thrombi. Furthermore in measuring with a tape measure the patient's calf it was not larger in size on the right than on the left. 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 She was sterilely educated about the benign nature of this condition and she was provided with Tegretol as needed to control the symptoms. As the patient already has a neurologist she was told to followup with him.  Discharge Exam: Blood pressure 105/66, pulse 58, temperature 98.8 F (37.1 C), temperature source Oral, resp. rate 19,  height 5' (1.524 m), weight 113 kg (249 lb 1.9 oz), SpO2 95.00%. Alert and oriented  CVS; RRR RS: CTAB No nuero deficits  Disposition: home  Discharge Orders    Future Orders Please Complete By Expires   Diet - low sodium heart healthy      Increase activity slowly         Follow-up Information    Make an appointment with Joycelyn Schmid, MD.   Contact information:   [address], [address] [place] [address] [facility] [place] [address] [phone]          Signed:   Lonia Blood 09/28/2011, 7:00 PM

## 2011-09-28 NOTE — Progress Notes (Signed)
Pharmacy-addendum Pt received Lovenox 115mg  in ED this AM at 0530 & again at 1230.  Therefore will start Lovenox 115mg  SQ q12 at 0600 on Mon as has received total daily dose already.

## 2011-09-28 NOTE — Progress Notes (Signed)
ANTICOAGULATION CONSULT NOTE - Initial Consult  Pharmacy Consult for Lovenox Indication: R/O PE  Allergies  Allergen Reactions  . Aliskiren Fumarate     REACTION: ANXIETY // SYNCOPE  . Codeine   . Eszopiclone     REACTION: NIGHT MARES  . Iohexol      Desc: NAUSEA,VOMITING & SZ S/P CONTRAST INJ 30 YRS AGO// OK W/ PRE MEDS, BENADRYL & SOLUMEDROL TODAY//A.C., Onset Date: [id number]   . Labetalol Hcl   . Penicillins   . Topamax Other (See Comments)    Confusion, high blood pressure.   . Vancomycin     Patient Measurements: Height: 5' (152.4 cm) Weight: 249 lb 1.9 oz (113 kg) IBW/kg (Calculated) : 45.5    Vital Signs: Temp: 98.4 F (36.9 C) (12/02 1045) Temp src: Oral (12/02 1045) BP: 155/90 mmHg (12/02 1045) Pulse Rate: 59  (12/02 1045)  Labs:  Basename 09/28/11 0340 09/27/11 2220 09/27/11 2154  HGB -- 15.0 13.9  HCT -- 44.0 41.6  PLT 312 -- 330  APTT 29 -- --  LABPROT 14.0 -- --  INR 1.06 -- --  HEPARINUNFRC -- -- --  CREATININE -- 0.90 --  CKTOTAL -- -- --  CKMB -- -- --  TROPONINI -- -- --   Estimated Creatinine Clearance: 79.9 ml/min (by C-G formula based on Cr of 0.9).  Medical History: Past Medical History  Diagnosis Date  . Chest pain   . Anxiety   . Depression   . UTI (urinary tract infection)   . Obesity   . Sinus node dysfunction      status post placement of    Medtronic pacemaker by Dr. Lewayne Bunting in 2009.   Marland Kitchen HTN (hypertension)   . Obesity   . Cystitis   . Palpitations     Medications:  Scheduled:    . sodium chloride   Intravenous Once  . aspirin      . aspirin  81 mg Oral Daily  . clonazePAM  1 mg Oral BID  . enoxaparin  1 mg/kg Subcutaneous To Minor  . enoxaparin  1 mg/kg Subcutaneous BID  . HYDROmorphone  1 mg Intravenous Once  . metoprolol  50 mg Oral BID  . morphine  15 mg Oral BID  . morphine  15 mg Oral To Major  . ondansetron  4 mg Intravenous Once  . promethazine  25 mg Intravenous To Major  . sodium chloride  3 mL  Intravenous Q12H  . traZODone  100 mg Oral QHS  . traZODone  100 mg Oral To Major  . vitamin B-12  1,000 mcg Oral Daily  . DISCONTD: aspirin  81 mg Oral Daily  . DISCONTD: sodium chloride  3 mL Intravenous Q12H    Assessment: 55yo female with (+)h/o PE on no Coumadin pta, presenting with RLL swelling & SOB x 2 days.  Doppler was (-) for DVT.      Plan:  1.  Lovenox 115mg  SQ q12 2.  CBC q72  Ambert Virrueta P 09/28/2011,1:26 PM

## 2011-09-30 NOTE — Progress Notes (Signed)
   CARE MANAGEMENT NOTE 09/30/2011  Patient:  Susan Howell, Susan Howell   Account Number:  [id number]  Date Initiated:  09/30/2011  Documentation initiated by:  Letha Cape  Subjective/Objective Assessment:   dx r/o dvt /pe  admit     Action/Plan:   Anticipated DC Date:  09/28/2011   Anticipated DC Plan:  HOME/SELF CARE      DC Planning Services  CM consult      Choice offered to / List presented to:             Status of service:  Completed, signed off Medicare Important Message given?   (If response is "NO", the following Medicare IM given date fields will be blank) Date Medicare IM given:   Date Additional Medicare IM given:    Discharge Disposition:  HOME/SELF CARE  Per UR Regulation:  Reviewed for med. necessity/level of care/duration of stay  Comments:  09/30/11 10:58 Letha Cape RN, BSN [phone] utilization review done.

## 2011-12-16 ENCOUNTER — Ambulatory Visit (INDEPENDENT_AMBULATORY_CARE_PROVIDER_SITE_OTHER): Payer: Medicaid Other | Admitting: *Deleted

## 2011-12-16 ENCOUNTER — Encounter: Payer: Self-pay | Admitting: Internal Medicine

## 2011-12-16 DIAGNOSIS — Z950 Presence of cardiac pacemaker: Secondary | ICD-10-CM

## 2011-12-18 LAB — REMOTE PACEMAKER DEVICE
AL IMPEDENCE PM: 713 Ohm
AL THRESHOLD: 1.375 V
BATTERY VOLTAGE: 2.8 V
RV LEAD AMPLITUDE: 22.4 mv

## 2011-12-22 NOTE — Progress Notes (Signed)
Pacer remote 

## 2011-12-24 ENCOUNTER — Encounter: Payer: Self-pay | Admitting: *Deleted

## 2012-01-09 ENCOUNTER — Encounter: Payer: Self-pay | Admitting: Internal Medicine

## 2012-03-10 ENCOUNTER — Encounter: Payer: Medicaid Other | Admitting: Internal Medicine

## 2012-03-21 ENCOUNTER — Encounter: Payer: Self-pay | Admitting: Internal Medicine

## 2012-03-22 LAB — REMOTE PACEMAKER DEVICE
ATRIAL PACING PM: 9
BATTERY VOLTAGE: 2.8 V
VENTRICULAR PACING PM: 0

## 2012-03-23 ENCOUNTER — Ambulatory Visit (INDEPENDENT_AMBULATORY_CARE_PROVIDER_SITE_OTHER): Payer: Medicaid Other | Admitting: *Deleted

## 2012-03-23 DIAGNOSIS — I495 Sick sinus syndrome: Secondary | ICD-10-CM

## 2012-03-23 DIAGNOSIS — Z950 Presence of cardiac pacemaker: Secondary | ICD-10-CM

## 2012-03-29 ENCOUNTER — Encounter: Payer: Self-pay | Admitting: *Deleted

## 2012-05-06 ENCOUNTER — Encounter: Payer: Self-pay | Admitting: *Deleted

## 2012-05-06 DIAGNOSIS — Z950 Presence of cardiac pacemaker: Secondary | ICD-10-CM | POA: Insufficient documentation

## 2012-05-11 ENCOUNTER — Ambulatory Visit (INDEPENDENT_AMBULATORY_CARE_PROVIDER_SITE_OTHER): Payer: Medicaid Other | Admitting: Internal Medicine

## 2012-05-11 ENCOUNTER — Encounter: Payer: Self-pay | Admitting: Internal Medicine

## 2012-05-11 VITALS — BP 128/82 | HR 58 | Ht 60.0 in | Wt 244.8 lb

## 2012-05-11 DIAGNOSIS — Z950 Presence of cardiac pacemaker: Secondary | ICD-10-CM

## 2012-05-11 DIAGNOSIS — I495 Sick sinus syndrome: Secondary | ICD-10-CM | POA: Insufficient documentation

## 2012-05-11 DIAGNOSIS — G8929 Other chronic pain: Secondary | ICD-10-CM

## 2012-05-11 DIAGNOSIS — R079 Chest pain, unspecified: Secondary | ICD-10-CM

## 2012-05-11 LAB — PACEMAKER DEVICE OBSERVATION
AL IMPEDENCE PM: 730 Ohm
BAMS-0001: 175 {beats}/min

## 2012-05-11 NOTE — Assessment & Plan Note (Signed)
The patient's device was interrogated.  The information was reviewed. No changes were made in the programming.    

## 2012-05-11 NOTE — Assessment & Plan Note (Signed)
As noted she is having chronic exertional chest pain. It is not changed appreciably in the last year or Tuesday she had stress testing done at [facility]. She will help [place] by getting these records when she goes down there next week.

## 2012-05-11 NOTE — Patient Instructions (Addendum)
Your physician wants you to follow-up in: with Dr. Graciela Husbands in 1 year and remote transmission for device 08/16/12. You will receive a reminder letter in the mail two months in advance. If you don't receive a letter, please call our office to schedule the follow-up appointment.

## 2012-05-11 NOTE — Progress Notes (Signed)
HPI  Susan Howell is a 56 y.o. female Seen in followup for a pacemaker inserted for sinus node dysfunction. She has a history of hypertension.  She is doing relatively well despite her myriad Problems. Her  Pacemaker seems not to be a problem  She has chest pains but has had them for some years and apparently underwent catheterization about 6 years ago at,; these records unfortunately are not available to the current computer networking. She also had a [facility] in the last year or 2 she recalls a stress test for similar chest pain. Past Medical History  Diagnosis Date  . Chest pain   . Anxiety   . Depression   . UTI (urinary tract infection)   . Obesity   . Sinus node dysfunction      status post placement of    Medtronic pacemaker by Dr. Lewayne Bunting in 2009.   Marland Kitchen HTN (hypertension)   . Obesity   . Cystitis   . Palpitations   . Lung nodules   . History of urinary tract infection     Past Surgical History  Procedure Date  . Pacemaker placement   . Appendectomy   . Cholecystectomy   . Splenectomy, total     Current Outpatient Prescriptions  Medication Sig Dispense Refill  . clonazePAM (KLONOPIN) 1 MG tablet Take 1 tablet (1 mg total) by mouth 2 (two) times daily.  60 tablet  0  . hydrochlorothiazide (HYDRODIURIL) 12.5 MG tablet Take 12.5 mg by mouth daily.      . metoprolol (LOPRESSOR) 50 MG tablet Take 50 mg by mouth 3 (three) times daily.       Marland Kitchen morphine (MS CONTIN) 15 MG 12 hr tablet Take 15 mg by mouth 3 (three) times daily.       . phenazopyridine (PYRIDIUM) 200 MG tablet Take 200 mg by mouth as needed.      . promethazine (PHENERGAN) 25 MG tablet Take 25 mg by mouth every 6 (six) hours as needed.        . Sulfamethoxazole-Trimethoprim (SULFAMETHOXAZOLE-TMP DS PO) Take by mouth 2 (two) times daily.      . traZODone (DESYREL) 100 MG tablet Take 100 mg by mouth at bedtime.       . vitamin B-12 (CYANOCOBALAMIN) 1000 MCG tablet Take 1,000 mcg by mouth daily.           Allergies  Allergen Reactions  . Aliskiren Fumarate     REACTION: ANXIETY // SYNCOPE  . Codeine   . Eszopiclone     REACTION: NIGHT MARES  . Iohexol      Desc: NAUSEA,VOMITING & SZ S/P CONTRAST INJ 30 YRS AGO// OK W/ PRE MEDS, BENADRYL & SOLUMEDROL TODAY//A.C., Onset Date: [id number]   . Labetalol Hcl   . Penicillins   . Topamax Other (See Comments)    Confusion, high blood pressure.   . Vancomycin     Review of Systems negative except from HPI and PMH  Physical Exam BP 128/82  Pulse 58  Ht 5' (1.524 m)  Wt 244 lb 12 oz (111.018 kg)  BMI 47.80 kg/m2 Well developed and well nourished elderly obese Caucasian female in no acute distress HENT normal E scleral and icterus clear Neck Supple JVP flat; carotids brisk and full Clear to ausculation Regular rate and rhythm, early systolic murmur Soft with active bowel sounds No clubbing cyanosis Trace Edema Alert and oriented, grossly normal motor and sensory function Skin Warm and Dry    Assessment and  Plan

## 2012-05-11 NOTE — Assessment & Plan Note (Signed)
She takes about 10% of the time at a lower rate limit with reasonable heart rate excursion

## 2012-06-15 ENCOUNTER — Ambulatory Visit: Payer: Medicaid Other | Admitting: Internal Medicine

## 2012-06-28 ENCOUNTER — Emergency Department (HOSPITAL_COMMUNITY)
Admission: EM | Admit: 2012-06-28 | Discharge: 2012-06-28 | Disposition: A | Discharge status: Home / Self Care | Payer: Medicaid Other | Attending: Emergency Medicine | Admitting: Emergency Medicine

## 2012-06-28 ENCOUNTER — Encounter (HOSPITAL_COMMUNITY): Payer: Self-pay | Admitting: *Deleted

## 2012-06-28 ENCOUNTER — Emergency Department (HOSPITAL_COMMUNITY): Payer: Medicaid Other

## 2012-06-28 DIAGNOSIS — R071 Chest pain on breathing: Secondary | ICD-10-CM | POA: Insufficient documentation

## 2012-06-28 DIAGNOSIS — F411 Generalized anxiety disorder: Secondary | ICD-10-CM | POA: Insufficient documentation

## 2012-06-28 DIAGNOSIS — R0789 Other chest pain: Secondary | ICD-10-CM

## 2012-06-28 DIAGNOSIS — I10 Essential (primary) hypertension: Secondary | ICD-10-CM | POA: Insufficient documentation

## 2012-06-28 DIAGNOSIS — E669 Obesity, unspecified: Secondary | ICD-10-CM | POA: Insufficient documentation

## 2012-06-28 DIAGNOSIS — Z86711 Personal history of pulmonary embolism: Secondary | ICD-10-CM | POA: Insufficient documentation

## 2012-06-28 MED ORDER — OXYCODONE-ACETAMINOPHEN 5-325 MG PO TABS
2.0000 | ORAL_TABLET | Freq: Once | ORAL | Status: AC
  Administered 2012-06-28: 2 via ORAL
  Filled 2012-06-28: qty 2
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
## 2012-08-16 ENCOUNTER — Encounter: Payer: Medicaid Other | Admitting: *Deleted

## 2012-08-20 ENCOUNTER — Encounter: Payer: Self-pay | Admitting: *Deleted

## 2012-08-26 ENCOUNTER — Ambulatory Visit (INDEPENDENT_AMBULATORY_CARE_PROVIDER_SITE_OTHER): Payer: Medicaid Other | Admitting: *Deleted

## 2012-08-26 ENCOUNTER — Encounter: Payer: Self-pay | Admitting: Internal Medicine

## 2012-08-26 DIAGNOSIS — Z950 Presence of cardiac pacemaker: Secondary | ICD-10-CM

## 2012-08-26 DIAGNOSIS — I495 Sick sinus syndrome: Secondary | ICD-10-CM

## 2012-09-07 LAB — REMOTE PACEMAKER DEVICE
AL AMPLITUDE: 2.8 mv
BAMS-0001: 175 {beats}/min
RV LEAD AMPLITUDE: 22.4 mv
VENTRICULAR PACING PM: 0

## 2012-10-04 ENCOUNTER — Encounter: Payer: Self-pay | Admitting: *Deleted

## 2012-12-01 ENCOUNTER — Other Ambulatory Visit: Payer: Self-pay | Admitting: Urology

## 2012-12-02 ENCOUNTER — Other Ambulatory Visit: Payer: Self-pay | Admitting: Urology

## 2012-12-06 ENCOUNTER — Ambulatory Visit (INDEPENDENT_AMBULATORY_CARE_PROVIDER_SITE_OTHER): Payer: Medicaid Other | Admitting: *Deleted

## 2012-12-06 ENCOUNTER — Other Ambulatory Visit: Payer: Self-pay

## 2012-12-06 DIAGNOSIS — Z950 Presence of cardiac pacemaker: Secondary | ICD-10-CM

## 2012-12-06 DIAGNOSIS — I495 Sick sinus syndrome: Secondary | ICD-10-CM

## 2012-12-09 LAB — REMOTE PACEMAKER DEVICE
AL IMPEDENCE PM: 715 Ohm
AL THRESHOLD: 1.5 V
ATRIAL PACING PM: 11
BATTERY VOLTAGE: 2.8 V

## 2012-12-12 ENCOUNTER — Emergency Department (HOSPITAL_COMMUNITY): Payer: Medicaid Other

## 2012-12-12 ENCOUNTER — Encounter (HOSPITAL_COMMUNITY): Payer: Self-pay | Admitting: Emergency Medicine

## 2012-12-12 ENCOUNTER — Inpatient Hospital Stay (HOSPITAL_COMMUNITY)
Admission: EM | Admit: 2012-12-12 | Discharge: 2012-12-18 | DRG: 176 | Disposition: A | Discharge status: Home / Self Care | Payer: Medicaid Other | Attending: Pulmonary Disease | Admitting: Pulmonary Disease

## 2012-12-12 DIAGNOSIS — T148 Other injury of unspecified body region: Secondary | ICD-10-CM

## 2012-12-12 DIAGNOSIS — R071 Chest pain on breathing: Secondary | ICD-10-CM

## 2012-12-12 DIAGNOSIS — J984 Other disorders of lung: Secondary | ICD-10-CM

## 2012-12-12 DIAGNOSIS — F3289 Other specified depressive episodes: Secondary | ICD-10-CM | POA: Diagnosis present

## 2012-12-12 DIAGNOSIS — Z79899 Other long term (current) drug therapy: Secondary | ICD-10-CM

## 2012-12-12 DIAGNOSIS — F411 Generalized anxiety disorder: Secondary | ICD-10-CM

## 2012-12-12 DIAGNOSIS — R5383 Other fatigue: Secondary | ICD-10-CM

## 2012-12-12 DIAGNOSIS — Z6841 Body Mass Index (BMI) 40.0 and over, adult: Secondary | ICD-10-CM

## 2012-12-12 DIAGNOSIS — S2239XA Fracture of one rib, unspecified side, initial encounter for closed fracture: Secondary | ICD-10-CM

## 2012-12-12 DIAGNOSIS — H5310 Unspecified subjective visual disturbances: Secondary | ICD-10-CM

## 2012-12-12 DIAGNOSIS — S93499A Sprain of other ligament of unspecified ankle, initial encounter: Secondary | ICD-10-CM

## 2012-12-12 DIAGNOSIS — F519 Sleep disorder not due to a substance or known physiological condition, unspecified: Secondary | ICD-10-CM

## 2012-12-12 DIAGNOSIS — N390 Urinary tract infection, site not specified: Secondary | ICD-10-CM

## 2012-12-12 DIAGNOSIS — R197 Diarrhea, unspecified: Secondary | ICD-10-CM

## 2012-12-12 DIAGNOSIS — I498 Other specified cardiac arrhythmias: Secondary | ICD-10-CM | POA: Diagnosis present

## 2012-12-12 DIAGNOSIS — I10 Essential (primary) hypertension: Secondary | ICD-10-CM

## 2012-12-12 DIAGNOSIS — S96819A Strain of other specified muscles and tendons at ankle and foot level, unspecified foot, initial encounter: Secondary | ICD-10-CM

## 2012-12-12 DIAGNOSIS — M25569 Pain in unspecified knee: Secondary | ICD-10-CM

## 2012-12-12 DIAGNOSIS — F319 Bipolar disorder, unspecified: Secondary | ICD-10-CM

## 2012-12-12 DIAGNOSIS — E669 Obesity, unspecified: Secondary | ICD-10-CM

## 2012-12-12 DIAGNOSIS — S060X1A Concussion with loss of consciousness of 30 minutes or less, initial encounter: Secondary | ICD-10-CM

## 2012-12-12 DIAGNOSIS — R5381 Other malaise: Secondary | ICD-10-CM

## 2012-12-12 DIAGNOSIS — F329 Major depressive disorder, single episode, unspecified: Secondary | ICD-10-CM | POA: Diagnosis present

## 2012-12-12 DIAGNOSIS — I2699 Other pulmonary embolism without acute cor pulmonale: Secondary | ICD-10-CM

## 2012-12-12 DIAGNOSIS — M810 Age-related osteoporosis without current pathological fracture: Secondary | ICD-10-CM

## 2012-12-12 DIAGNOSIS — G8929 Other chronic pain: Secondary | ICD-10-CM

## 2012-12-12 DIAGNOSIS — Z9884 Bariatric surgery status: Secondary | ICD-10-CM

## 2012-12-12 DIAGNOSIS — I495 Sick sinus syndrome: Secondary | ICD-10-CM

## 2012-12-12 DIAGNOSIS — Z86711 Personal history of pulmonary embolism: Secondary | ICD-10-CM

## 2012-12-12 DIAGNOSIS — E7800 Pure hypercholesterolemia, unspecified: Secondary | ICD-10-CM

## 2012-12-12 DIAGNOSIS — J309 Allergic rhinitis, unspecified: Secondary | ICD-10-CM

## 2012-12-12 DIAGNOSIS — Z9189 Other specified personal risk factors, not elsewhere classified: Secondary | ICD-10-CM

## 2012-12-12 DIAGNOSIS — H8309 Labyrinthitis, unspecified ear: Secondary | ICD-10-CM

## 2012-12-12 DIAGNOSIS — F4321 Adjustment disorder with depressed mood: Secondary | ICD-10-CM

## 2012-12-12 DIAGNOSIS — G4733 Obstructive sleep apnea (adult) (pediatric): Secondary | ICD-10-CM

## 2012-12-12 DIAGNOSIS — W57XXXA Bitten or stung by nonvenomous insect and other nonvenomous arthropods, initial encounter: Secondary | ICD-10-CM

## 2012-12-12 DIAGNOSIS — G5710 Meralgia paresthetica, unspecified lower limb: Secondary | ICD-10-CM

## 2012-12-12 DIAGNOSIS — R0602 Shortness of breath: Secondary | ICD-10-CM

## 2012-12-12 DIAGNOSIS — J069 Acute upper respiratory infection, unspecified: Secondary | ICD-10-CM

## 2012-12-12 DIAGNOSIS — R300 Dysuria: Secondary | ICD-10-CM

## 2012-12-12 DIAGNOSIS — Z950 Presence of cardiac pacemaker: Secondary | ICD-10-CM

## 2012-12-12 LAB — POCT I-STAT, CHEM 8
BUN: 10 mg/dL (ref 6–23)
Calcium, Ion: 1.13 mmol/L (ref 1.12–1.23)
HCT: 42 % (ref 36.0–46.0)
Hemoglobin: 14.3 g/dL (ref 12.0–15.0)
Sodium: 143 mEq/L (ref 135–145)
TCO2: 29 mmol/L (ref 0–100)

## 2012-12-12 LAB — POCT I-STAT TROPONIN I: Troponin i, poc: 0 ng/mL (ref 0.00–0.08)

## 2012-12-12 LAB — CBC WITH DIFFERENTIAL/PLATELET
Basophils Absolute: 0 10*3/uL (ref 0.0–0.1)
Basophils Relative: 0 % (ref 0–1)
Eosinophils Absolute: 0.1 10*3/uL (ref 0.0–0.7)
Eosinophils Relative: 1 % (ref 0–5)
HCT: 40.2 % (ref 36.0–46.0)
Hemoglobin: 13.7 g/dL (ref 12.0–15.0)
Lymphocytes Relative: 49 % — ABNORMAL HIGH (ref 12–46)
Lymphs Abs: 4.7 K/uL — ABNORMAL HIGH (ref 0.7–4.0)
MCH: 33.3 pg (ref 26.0–34.0)
MCHC: 34.1 g/dL (ref 30.0–36.0)
MCV: 97.6 fL (ref 78.0–100.0)
Monocytes Absolute: 0.9 10*3/uL (ref 0.1–1.0)
Monocytes Relative: 9 % (ref 3–12)
Neutro Abs: 3.9 K/uL (ref 1.7–7.7)
Neutrophils Relative %: 40 % — ABNORMAL LOW (ref 43–77)
Platelets: 298 10*3/uL (ref 150–400)
RBC: 4.12 MIL/uL (ref 3.87–5.11)
RDW: 14.1 % (ref 11.5–15.5)
WBC: 9.7 K/uL (ref 4.0–10.5)

## 2012-12-12 LAB — APTT: aPTT: 31 s (ref 24–37)

## 2012-12-12 LAB — PROTIME-INR
INR: 0.99 (ref 0.00–1.49)
Prothrombin Time: 13 s (ref 11.6–15.2)

## 2012-12-12 MED ORDER — HEPARIN BOLUS VIA INFUSION
4000.0000 [IU] | Freq: Once | INTRAVENOUS | Status: AC
  Administered 2012-12-12: 4000 [IU] via INTRAVENOUS

## 2012-12-12 MED ORDER — IOHEXOL 350 MG/ML SOLN
100.0000 mL | Freq: Once | INTRAVENOUS | Status: AC | PRN
  Administered 2012-12-12: 100 mL via INTRAVENOUS

## 2012-12-12 MED ORDER — METHYLPREDNISOLONE SODIUM SUCC 125 MG IJ SOLR
125.0000 mg | Freq: Once | INTRAMUSCULAR | Status: AC
  Administered 2012-12-12: 125 mg via INTRAVENOUS
  Filled 2012-12-12: qty 2

## 2012-12-12 MED ORDER — SODIUM CHLORIDE 0.9 % IV SOLN
INTRAVENOUS | Status: DC
  Administered 2012-12-12 – 2012-12-13 (×2): via INTRAVENOUS

## 2012-12-12 MED ORDER — HYDROMORPHONE HCL PF 1 MG/ML IJ SOLN
1.0000 mg | Freq: Once | INTRAMUSCULAR | Status: AC
  Administered 2012-12-12: 1 mg via INTRAVENOUS
  Filled 2012-12-12: qty 1

## 2012-12-12 MED ORDER — DIPHENHYDRAMINE HCL 50 MG/ML IJ SOLN
INTRAMUSCULAR | Status: AC
  Filled 2012-12-12: qty 1

## 2012-12-12 MED ORDER — GENTAMICIN IN SALINE 1.6-0.9 MG/ML-% IV SOLN: 80.0000 mg | INTRAVENOUS | Status: DC

## 2012-12-12 MED ORDER — FLEET ENEMA 7-19 GM/118ML RE ENEM: 1.0000 | ENEMA | Freq: Once | RECTAL | Status: DC

## 2012-12-12 MED ORDER — HYDROMORPHONE HCL PF 1 MG/ML IJ SOLN
1.0000 mg | Freq: Once | INTRAMUSCULAR | Status: DC
  Administered 2012-12-12: 1 mg via INTRAVENOUS
  Filled 2012-12-12: qty 1

## 2012-12-12 MED ORDER — HYDROMORPHONE HCL PF 1 MG/ML IJ SOLN
0.5000 mg | INTRAMUSCULAR | Status: DC | PRN
  Administered 2012-12-13 – 2012-12-18 (×16): 0.5 mg via INTRAVENOUS
  Filled 2012-12-12 (×17): qty 1

## 2012-12-12 MED ORDER — HEPARIN (PORCINE) IN NACL 100-0.45 UNIT/ML-% IJ SOLN
1250.0000 [IU]/h | INTRAMUSCULAR | Status: DC
  Administered 2012-12-12: 1300 [IU]/h via INTRAVENOUS
  Administered 2012-12-13 – 2012-12-15 (×2): 1100 [IU]/h via INTRAVENOUS
  Administered 2012-12-16 – 2012-12-17 (×3): 1250 [IU]/h via INTRAVENOUS
  Filled 2012-12-12 (×9): qty 250

## 2012-12-12 MED ORDER — DIPHENHYDRAMINE HCL 50 MG/ML IJ SOLN
50.0000 mg | Freq: Once | INTRAMUSCULAR | Status: AC
  Administered 2012-12-12: 50 mg via INTRAVENOUS
  Filled 2012-12-12: qty 1

## 2012-12-12 MED ORDER — DIPHENHYDRAMINE HCL 50 MG/ML IJ SOLN
25.0000 mg | Freq: Once | INTRAMUSCULAR | Status: AC
  Administered 2012-12-12: 25 mg via INTRAVENOUS

## 2012-12-12 NOTE — ED Notes (Signed)
Patient says she has been having chest pain since Friday and the pain got worse so she came in today.  Patient did have SOB, dizzy if she walks, and back pain but her back pain is normal for her.  Patient says she did have a temperature last night.  She also says she has been having palpitations.  Patient says she does have a pacemaker.

## 2012-12-12 NOTE — ED Provider Notes (Signed)
History     CSN: [id number]  Arrival date & time 12/12/12  1527   First MD Initiated Contact with Patient 12/12/12 1611      Chief Complaint  Patient presents with  . Chest Pain  . Shortness of Breath    (Consider location/radiation/quality/duration/timing/severity/associated sxs/prior treatment) HPI Comments: Pt with "lung pain" with breathing, cough, fever to 101.7 at home and feeling poorly for past 2-3 days.  Pt continued to get worse so came to the ED today.  Poor appetite.  No N/V/D. Denies dysuria, frequency.  She has had slight cough, but sats have been reportedly low on her own pulse ox at home intermittently esp at night, when she lays on side she gets CP and rib pain. H/o pneumonia many years ago, has a h/o PE.  Not on coumadin currently.  This PE was 30 years ago after a weight loss surgery.  Pt later also has had a DVT in left leg.  Pt reports remote h/o of allergy to iodine and contrast dye, she doesn't think she is allergic to current dyes, but she gets a steroid and benadryl prep prior to contrasted CT's.  She took some ibuprofen yesterday with no sig relief.    Patient is a 57 y.o. female presenting with chest pain and shortness of breath. The history is provided by the patient, the spouse and medical records.  Chest Pain Associated symptoms: fatigue, fever and shortness of breath   Associated symptoms: no headache, no nausea and not vomiting   Shortness of Breath Associated symptoms: chest pain and fever   Associated symptoms: no headaches, no rash and no vomiting     Past Medical History  Diagnosis Date  . Chest pain   . Anxiety   . Depression   . UTI (urinary tract infection)   . Obesity   . Sinus node dysfunction      status post placement of    Medtronic pacemaker by Dr. Lewayne Bunting in 2009.   Marland Kitchen HTN (hypertension)   . Obesity   . Cystitis   . Palpitations   . Lung nodules   . History of urinary tract infection   . Pulmonary embolism     Complicating  gastric bypass  . Sinoatrial node dysfunction     Past Surgical History  Procedure Laterality Date  . Pacemaker placement    . Appendectomy    . Cholecystectomy    . 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 Gastric bypass      History reviewed. No pertinent family history.  History  Substance Use Topics  . Smoking status: Never Smoker   . Smokeless tobacco: Never Used  . Alcohol Use: No    OB History   Grav Para Term Preterm Abortions TAB SAB Ect Mult Living                  Review of Systems  Constitutional: Positive for fever and fatigue.  HENT: Negative for neck stiffness.   Respiratory: Positive for shortness of breath.   Cardiovascular: Positive for chest pain.  Gastrointestinal: Negative for nausea, vomiting and diarrhea.  Genitourinary: Negative for dysuria and frequency.  Musculoskeletal: Positive for myalgias.  Skin: Negative for rash.  Neurological: Negative for headaches.  All other systems reviewed and are negative.    Allergies  Codeine; Aliskiren fumarate; Eszopiclone; Iodine; Iohexol; Labetalol hcl; Penicillins; Tetanus toxoids; Topamax; and Vancomycin  Home Medications   Current Outpatient  Rx  Name  Route  Sig  Dispense  Refill  . clonazePAM (KLONOPIN) 1 MG tablet   Oral   Take 1 tablet (1 mg total) by mouth 2 (two) times daily.   60 tablet   0   . lisinopril (PRINIVIL,ZESTRIL) 10 MG tablet   Oral   Take 5 mg by mouth every morning.          . metoprolol (LOPRESSOR) 50 MG tablet   Oral   Take 50 mg by mouth 2 (two) times daily.          Marland Kitchen morphine (MS CONTIN) 15 MG 12 hr tablet   Oral   Take 15 mg by mouth 3 (three) times daily.          . traZODone (DESYREL) 150 MG tablet   Oral   Take 150 mg by mouth at bedtime.         . vitamin B-12 (CYANOCOBALAMIN) 1000 MCG tablet   Oral   Take 1,000 mcg by mouth daily.             BP 158/97  Pulse 65  Temp(Src) 98.7 F  (37.1 C) (Oral)  Resp 19  SpO2 95%  Physical Exam  Nursing note and vitals reviewed. Constitutional: She appears well-developed and well-nourished.  Obese, no distress  HENT:  Head: Normocephalic and atraumatic.  Eyes: No scleral icterus.  Neck: Normal range of motion. Neck supple.  Cardiovascular: Normal rate.   Pulmonary/Chest: Effort normal and breath sounds normal. No respiratory distress. She has no wheezes.  Abdominal: Soft. There is no tenderness. There is no rebound.  Neurological: She is alert.  Skin: Skin is warm. No rash noted.    ED Course  Procedures (including critical care time)  CRITICAL CARE Performed by: Lear Ng.   Total critical care time: 35 min  Critical care time was exclusive of separately billable procedures and treating other patients.  Critical care was necessary to treat or prevent imminent or life-threatening deterioration.  Critical care was time spent personally by me on the following activities: development of treatment plan with patient and/or surrogate as well as nursing, discussions with consultants, evaluation of patient's response to treatment, examination of patient, obtaining history from patient or surrogate, ordering and performing treatments and interventions, ordering and review of laboratory studies, ordering and review of radiographic studies, pulse oximetry and re-evaluation of patient's condition.   Labs Reviewed  CBC WITH DIFFERENTIAL - Abnormal; Notable for the following:    Neutrophils Relative 40 (*)    Lymphocytes Relative 49 (*)    Lymphs Abs 4.7 (*)    All other components within normal limits  POCT I-STAT, CHEM 8 - Abnormal; Notable for the following:    Glucose, Bld 100 (*)    All other components within normal limits  URINALYSIS, ROUTINE W REFLEX MICROSCOPIC  POCT I-STAT TROPONIN I   Dg Chest 2 View  12/12/2012  *RADIOLOGY REPORT*  Clinical Data: Chest pain, shortness of breath.  CHEST - 2 VIEW   Comparison: 06/28/2012  Findings: Left pacer remains in place, unchanged.  Heart is borderline enlarged.  No confluent airspace opacities or effusions. Stable mild vascular congestion.  Postoperative changes from thoracolumbar fusion again noted.  Fusion crosses a moderate compression fracture likely in the upper lumbar spine.  IMPRESSION: No acute cardiopulmonary disease.  Stable cardiomegaly and mild vascular congestion.   Original Report Authenticated By: Charlett Nose, M.D.    Ct Angio Chest W/cm &/or Wo Cm  12/12/2012  *  RADIOLOGY REPORT*  Clinical Data: Chest pain.  Shortness of breath.  Pulmonary embolism.  CT ANGIOGRAPHY CHEST  Technique:  Multidetector CT imaging of the chest using the standard protocol during bolus administration of intravenous contrast. Multiplanar reconstructed images including MIPs were obtained and reviewed to evaluate the vascular anatomy.  Contrast: OMNIPAQUE IOHEXOL 350 MG/ML SOLN  Comparison: Chest radiograph 12/12/2012  Findings: Positive study for pulmonary embolus.  Bilateral lower lobe pulmonary emboli are present.  Emboli extend into the right upper and left upper lobes as well.  Elevation/eventration of the left hemidiaphragm.  There is straightening and reversal of the interventricular septum compatible with right heart strain.  Rounded opacity is present in the posterior right lower lobe, suspicious for pulmonary infarction.  Respiratory motion is present throughout the examination.   Cardiomegaly. Incidental imaging the upper abdomen shows a probable splenic remnant in the left upper quadrant. Cholecystectomy. Thoracolumbar fixation hardware is present.  No aggressive osseous lesions are present.  Diffuse ground-glass attenuation in the lungs raises possibility of superimposed pulmonary edema although given the respiratory motion on the examination, some of this probably represents atelectasis as well. No adenopathy is identified.  No pleural effusion.  IMPRESSION:  1.  Positive study for pulmonary embolus with pulmonary emboli to all lobes with right heart strain. 2.  Likely peripheral pulmonary infarction in the posterior right lower lobe. 3. Critical Value/emergent results were called by telephone at the time of interpretation on 12/12/2012 at 1952 hours to Dr. Oletta Lamas, who verbally acknowledged these results.   Original Report Authenticated By: Andreas Newport, M.D.      1. Pulmonary embolism and infarction     ra sat is 94% which is reportedly normal and baseline for her   8:15 PM Pt's sats did drop to upper 80's, placed on O2 and pulse ox is now 93%.  CT shows heart strain, large clot burden.  I discussed with PCCM, will admit to consider ECHO and TPA.    MDM  Pt with reportedly fever to 101 at home, CP, some pleurisy with prior h/o DVT and PE.  CXR was reviewed by me and per radiologist, no acute findings.  Will give steroid and benadryl IV and obtain chest CT to r/o PE.  If neg, likely pt has viral illness.          Gavin Pound. Oletta Lamas, MD 12/12/12 2017

## 2012-12-12 NOTE — Progress Notes (Signed)
ANTICOAGULATION CONSULT NOTE - Initial Consult  Pharmacy Consult for heparin Indication: pulmonary embolus  Allergies  Allergen Reactions  . Codeine Itching  . Aliskiren Fumarate     REACTION: ANXIETY // SYNCOPE  . Eszopiclone     REACTION: NIGHT MARES  . Iodine Other (See Comments)    seizures  . Iohexol      Desc: NAUSEA,VOMITING & SZ S/P CONTRAST INJ 30 YRS AGO// OK W/ PRE MEDS, BENADRYL & SOLUMEDROL TODAY//A.C., Onset Date: [id number]   . Labetalol Hcl Hypertension  . Penicillins Nausea And Vomiting    Cannot take by mouth  . Tetanus Toxoids Swelling    Fever, flushing and hardening of the injection site  . Topamax Other (See Comments)    Confusion, high blood pressure.   . Vancomycin Nausea Only    Severe chills    Patient Measurements:   Heparin Dosing Weight: 73.1 kg  Vital Signs: Temp: 98.7 F (37.1 C) (02/16 2013) Temp src: Oral (02/16 2013) BP: 158/97 mmHg (02/16 2013) Pulse Rate: 65 (02/16 2013)  Labs:  Recent Labs  12/12/12 1542 12/12/12 1600  HGB 13.7 14.3  HCT 40.2 42.0  PLT 298  --   CREATININE  --  0.90    The CrCl is unknown because both a height and weight (above a minimum accepted value) are required for this calculation.   Medical History: Past Medical History  Diagnosis Date  . Chest pain   . Anxiety   . Depression   . UTI (urinary tract infection)   . Obesity   . Sinus node dysfunction      status post placement of    Medtronic pacemaker by Dr. Lewayne Bunting in 2009.   Marland Kitchen HTN (hypertension)   . Obesity   . Cystitis   . Palpitations   . Lung nodules   . History of urinary tract infection   . Pulmonary embolism     Complicating gastric bypass  . Sinoatrial node dysfunction     Medications:  Scheduled:  . [COMPLETED] diphenhydrAMINE  25 mg Intravenous Once  . [COMPLETED] diphenhydrAMINE  50 mg Intravenous Once  . [COMPLETED]  HYDROmorphone (DILAUDID) injection  1 mg Intravenous Once  . [COMPLETED] methylPREDNISolone  (SOLU-MEDROL) injection  125 mg Intravenous Once   Infusions:    Assessment: 57 yo female with PE will be started on heparin therapy.  Patient had history of PE ~30 years ago; also had a history of DVT.  Per MD, patient is not on coumadin currently.  Baseline H/H 13.7/40.2 and Plt 298 K Goal of Therapy:  Heparin level 0.3-0.7 units/ml Monitor platelets by anticoagulation protocol: Yes   Plan:  1) Heparin 4000 units iv bolus x1, then start heparin drip at 1300 units/hr. 2) 6hr heparin level 3) Daily heparin level and CBC  Garwood Wentzell, Tsz-Yin 12/12/2012,8:20 PM

## 2012-12-12 NOTE — ED Notes (Signed)
Pt c/o right rib pain with shortness of breath. Pt has pulse ox machine at home and was reading in the 80's. Pt fell 2 weeks ago and heard something pop on right side. Pt recorded a fever of 101.7 at home.

## 2012-12-13 DIAGNOSIS — Z86711 Personal history of pulmonary embolism: Secondary | ICD-10-CM

## 2012-12-13 DIAGNOSIS — I2699 Other pulmonary embolism without acute cor pulmonale: Principal | ICD-10-CM

## 2012-12-13 DIAGNOSIS — I10 Essential (primary) hypertension: Secondary | ICD-10-CM

## 2012-12-13 DIAGNOSIS — I519 Heart disease, unspecified: Secondary | ICD-10-CM

## 2012-12-13 DIAGNOSIS — Z9189 Other specified personal risk factors, not elsewhere classified: Secondary | ICD-10-CM

## 2012-12-13 DIAGNOSIS — E669 Obesity, unspecified: Secondary | ICD-10-CM

## 2012-12-13 LAB — CBC
HCT: 39.4 % (ref 36.0–46.0)
Hemoglobin: 13.6 g/dL (ref 12.0–15.0)
MCH: 33.1 pg (ref 26.0–34.0)
RBC: 4.11 MIL/uL (ref 3.87–5.11)

## 2012-12-13 LAB — HEPARIN LEVEL (UNFRACTIONATED)
Heparin Unfractionated: 0.65 IU/mL (ref 0.30–0.70)
Heparin Unfractionated: 0.57 IU/mL (ref 0.30–0.70)

## 2012-12-13 LAB — MRSA PCR SCREENING: MRSA by PCR: NEGATIVE

## 2012-12-13 MED ORDER — WARFARIN VIDEO
Freq: Once | Status: AC
  Administered 2012-12-13: 12:00:00

## 2012-12-13 MED ORDER — WARFARIN SODIUM 7.5 MG PO TABS
7.5000 mg | ORAL_TABLET | Freq: Once | ORAL | Status: AC
  Administered 2012-12-13: 7.5 mg via ORAL
  Filled 2012-12-13 (×2): qty 1

## 2012-12-13 MED ORDER — TRAZODONE HCL 150 MG PO TABS
150.0000 mg | ORAL_TABLET | Freq: Every evening | ORAL | Status: DC | PRN
  Administered 2012-12-13 – 2012-12-17 (×5): 150 mg via ORAL
  Filled 2012-12-13 (×6): qty 1

## 2012-12-13 MED ORDER — NITROFURANTOIN MACROCRYSTAL 100 MG PO CAPS
100.0000 mg | ORAL_CAPSULE | Freq: Every day | ORAL | Status: DC
  Administered 2012-12-13 – 2012-12-18 (×6): 100 mg via ORAL
  Filled 2012-12-13 (×6): qty 1

## 2012-12-13 MED ORDER — WARFARIN - PHARMACIST DOSING INPATIENT
Freq: Every day | Status: DC
  Administered 2012-12-13 – 2012-12-15 (×3)

## 2012-12-13 MED ORDER — LISINOPRIL 10 MG PO TABS
10.0000 mg | ORAL_TABLET | Freq: Every day | ORAL | Status: DC
  Administered 2012-12-13 – 2012-12-16 (×4): 10 mg via ORAL
  Filled 2012-12-13 (×4): qty 1

## 2012-12-13 MED ORDER — PATIENT'S GUIDE TO USING COUMADIN BOOK
Freq: Once | Status: AC
  Administered 2012-12-13: 19:00:00
  Filled 2012-12-13 (×2): qty 1

## 2012-12-13 MED ORDER — METOPROLOL TARTRATE 50 MG PO TABS
50.0000 mg | ORAL_TABLET | Freq: Two times a day (BID) | ORAL | Status: DC
  Administered 2012-12-13 – 2012-12-18 (×9): 50 mg via ORAL
  Filled 2012-12-13 (×13): qty 1

## 2012-12-13 MED ORDER — MORPHINE SULFATE ER 15 MG PO TBCR
15.0000 mg | EXTENDED_RELEASE_TABLET | Freq: Two times a day (BID) | ORAL | Status: DC
  Administered 2012-12-13 – 2012-12-18 (×12): 15 mg via ORAL
  Filled 2012-12-13 (×12): qty 1

## 2012-12-13 MED ORDER — CLONAZEPAM 1 MG PO TABS
1.0000 mg | ORAL_TABLET | Freq: Two times a day (BID) | ORAL | Status: DC
  Administered 2012-12-13 – 2012-12-18 (×12): 1 mg via ORAL
  Filled 2012-12-13 (×12): qty 1

## 2012-12-13 MED ORDER — HEPARIN SOD (PORCINE) IN D5W 100 UNIT/ML IV SOLN
INTRAVENOUS | Status: AC
  Filled 2012-12-13: qty 250

## 2012-12-13 NOTE — Progress Notes (Signed)
Pt profile: 33 F with remote hx of PE. Admitted with large bilateral PEs 2/16. Never HDly unstable  Active problems: Principal Problem:   Pulmonary embolism Active Problems:   Hx pulmonary embolism, 1979   Obesity   Sedentary lifestyle   Hypertension   Studies/Events: 2/16 CTA chest: Positive study for pulmonary embolus with pulmonary emboli to all lobes   Heparin gtt started 2/17 Warfarin started 2/17 Echo: 2/17 LE duplex:   Subj: No distress. No new complaints  Obj: Filed Vitals:   12/13/12 1155  BP: 109/67  Pulse:   Temp: 98 F (36.7 C)  Resp: 18    Gen: Obese, NAD HEENT: WNL Neck: no JVD seen Chest: clear Cardiac: RRR s M ZOX:WRUE, NT, NABS Ext: no pitting edema  BMET    Component Value Date/Time   NA 143 12/12/2012 1600   K 3.8 12/12/2012 1600   CL 104 12/12/2012 1600   CO2 27 03/20/2011 1752   GLUCOSE 100* 12/12/2012 1600   BUN 10 12/12/2012 1600   CREATININE 0.90 12/12/2012 1600   CALCIUM 9.3 03/20/2011 1752   GFRNONAA >60 03/20/2011 1752   GFRAA  Value: >60        The eGFR has been calculated using the MDRD equation. This calculation has not been validated in all clinical situations. eGFR's persistently <60 mL/min signify possible Chronic Kidney Disease. 03/20/2011 1752    CBC    Component Value Date/Time   WBC 10.3 12/13/2012 0254   RBC 4.11 12/13/2012 0254   HGB 13.6 12/13/2012 0254   HCT 39.4 12/13/2012 0254   PLT 282 12/13/2012 0254   MCV 95.9 12/13/2012 0254   MCH 33.1 12/13/2012 0254   MCHC 34.5 12/13/2012 0254   RDW 13.5 12/13/2012 0254   LYMPHSABS 4.7* 12/12/2012 1542   MONOABS 0.9 12/12/2012 1542   EOSABS 0.1 12/12/2012 1542   BASOSABS 0.0 12/12/2012 1542    CXR:  No new film   IMPRESSION: Principal Problem:   Pulmonary embolism Active Problems:   Hx pulmonary embolism, 1979   Obesity   Sedentary lifestyle   Hypertension  PE RF likely obesity, sedentary lifestyle, prior VTE (1979 after gastric bypass surgery) BP poorly  controlled  PLAN/RECS:  Transfer to Tele Begin warfarin Cont heparin F/U Echo, dopplers Resume home dose of lisinopril  Billy Fischer, MD ; [facility] [place] service Mobile [phone].  After 5:30 PM or weekends, call [phone]

## 2012-12-13 NOTE — Progress Notes (Signed)
ANTICOAGULATION CONSULT NOTE - Follow Up Consult  Pharmacy Consult for Heparin Indication: pulmonary embolus  Labs:  Recent Labs  12/12/12 1542 12/12/12 1600 12/12/12 2034 12/13/12 0254 12/13/12 1015 12/13/12 1639  HGB 13.7 14.3  --  13.6  --   --   HCT 40.2 42.0  --  39.4  --   --   PLT 298  --   --  282  --   --   APTT  --   --  31  --   --   --   LABPROT  --   --  13.0  --  13.7  --   INR  --   --  0.99  --  1.06  --   HEPARINUNFRC  --   --   --  1.00* 0.65 0.57  CREATININE  --  0.90  --   --   --   --    Medication: . heparin 1,100 Units/hr (12/13/12 1128)  . [DISCONTINUED] sodium chloride 100 mL/hr at 12/13/12 1478    Assessment: 56yo female on heparin for large bilateral PEs. Heparin level is therapeutic at 0.57. No bleeding noted.  Goal of Therapy:  Heparin level 0.3-0.7 units/ml Monitor platelets by anticoagulation protocol: Yes   Plan:  -Continue heparin drip at 1100 units/hr -Daily heparin level and CBC while on heparin -Monitor for signs/symptoms of bleeding  [facility], [place].D., BCPS Clinical Pharmacist Pager: [phone] 12/13/2012 6:00 PM

## 2012-12-13 NOTE — Progress Notes (Signed)
Home metoprolol restarted

## 2012-12-13 NOTE — Progress Notes (Signed)
Bilateral:  No evidence of DVT, superficial thrombosis, or Baker's Cyst.   

## 2012-12-13 NOTE — Progress Notes (Signed)
ANTICOAGULATION CONSULT NOTE - Follow Up Consult  Pharmacy Consult for heparin Indication: pulmonary embolus  Labs:  Recent Labs  12/12/12 1542 12/12/12 1600 12/12/12 2034 12/13/12 0254  HGB 13.7 14.3  --  13.6  HCT 40.2 42.0  --  39.4  PLT 298  --   --  282  APTT  --   --  31  --   LABPROT  --   --  13.0  --   INR  --   --  0.99  --   HEPARINUNFRC  --   --   --  1.00*  CREATININE  --  0.90  --   --     Assessment: 56yo female supratherapeutic on heparin with initial dosing for PE, surprising given pt size and extent of PE but per RN gtt running properly and lab drew correctly.  Goal of Therapy:  Heparin level 0.3-0.7 units/ml   Plan:  Will decrease heparin gtt by 2 units/kg/hr to 1100 units/hr and check level in 6hr.  Colleen Can PharmD BCPS 12/13/2012,3:44 AM

## 2012-12-13 NOTE — Progress Notes (Addendum)
ANTICOAGULATION CONSULT NOTE - Follow Up Consult  Pharmacy Consult for heparin/coumadin Indication: pulmonary embolus  Labs:  Recent Labs  12/12/12 1542 12/12/12 1600 12/12/12 2034 12/13/12 0254 12/13/12 1015  HGB 13.7 14.3  --  13.6  --   HCT 40.2 42.0  --  39.4  --   PLT 298  --   --  282  --   APTT  --   --  31  --   --   LABPROT  --   --  13.0  --  13.7  INR  --   --  0.99  --  1.06  HEPARINUNFRC  --   --   --  1.00* 0.65  CREATININE  --  0.90  --   --   --     Assessment: 56yo female supratherapeutic on heparin with initial dosing for PE, surprising given pt size and extent of PE but per RN gtt running properly and lab drew correctly.  Repeat heparin level therapeutic on 1100 units/hr.  Pharmacy asked to begin Coumadin today as well.  Goal of Therapy:  Heparin level 0.3-0.7 units/ml   Plan:  1. Continue IV heparin at current rate. 2. Will repeat heparin level at 1630 to confirm still therapeutic. 3. Coumadin 7.5 mg po x 1 tonight. 4. Begin Coumadin education. 5. Daily heparin level, INR and CBC.  Tad Moore, BCPS  Clinical Pharmacist Pager [phone]  12/13/2012 12:22 PM

## 2012-12-13 NOTE — H&P (Signed)
Name: Susan Howell MRN: [id number] DOB: [birth year]    LOS: 1  PULMONARY / CRITICAL CARE MEDICINE  HPI:  57 years old female with PMH relevant for morbid obesity, complicated gastric bypass surgery in 1979 with PE after surgery, also LLE DVT, never on long term anticoagulation. 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 Presents with two days history of low grade fever, pleuritic chest pain, non productive cough and SOB. The patient is sedentary with poor mobility. Spends about 5 hours of the day in bed. CTA of the chest was done in the ED and reported positive for pulmonary embolus with pulmonary emboli to all lobes with right heart strain. At the time of my examination the patient is awake, alert, oriented and hemodynamically stable. Saturating 94% on 3 L [place].    Past Medical History  Diagnosis Date  . Chest pain   . Anxiety   . Depression   . UTI (urinary tract infection)   . Obesity   . Sinus node dysfunction      status post placement of    [employer] pacemaker by Dr. Lewayne Bunting in 2009.   Marland Kitchen HTN (hypertension)   . Obesity   . Cystitis   . Palpitations   . Lung nodules   . History of urinary tract infection   . Pulmonary embolism     Complicating gastric bypass  . Sinoatrial node dysfunction    Past Surgical History  Procedure Laterality Date  . Pacemaker placement    . Appendectomy    . Cholecystectomy    . 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 Gastric bypass     Prior to Admission medications   Medication Sig Start Date End Date Taking? Authorizing Provider  clonazePAM (KLONOPIN) 1 MG tablet Take 1 tablet (1 mg total) by mouth 2 (two) times daily. 09/28/11  Yes Sorin Luanne Bras, MD  lisinopril (PRINIVIL,ZESTRIL) 10 MG tablet Take 5 mg by mouth every morning.    Yes Historical Provider, MD  metoprolol (LOPRESSOR) 50 MG tablet Take 50 mg by mouth 2 (two) times daily.    Yes Historical  Provider, MD  morphine (MS CONTIN) 15 MG 12 hr tablet Take 15 mg by mouth 3 (three) times daily.    Yes Historical Provider, MD  nitrofurantoin (MACRODANTIN) 100 MG capsule Take 100 mg by mouth daily.   Yes Historical Provider, MD  traZODone (DESYREL) 150 MG tablet Take 150 mg by mouth at bedtime.   Yes Historical Provider, MD  vitamin B-12 (CYANOCOBALAMIN) 1000 MCG tablet Take 1,000 mcg by mouth daily.     Yes Historical Provider, MD   Allergies Allergies  Allergen Reactions  . Codeine Itching  . Aliskiren Fumarate     REACTION: ANXIETY // SYNCOPE  . Eszopiclone     REACTION: NIGHT MARES  . Iodine Other (See Comments)    seizures  . Iohexol      Desc: NAUSEA,VOMITING & SZ S/P CONTRAST INJ 30 YRS AGO// OK W/ PRE MEDS, BENADRYL & SOLUMEDROL TODAY//A.C., Onset Date: [id number]   . Labetalol Hcl Hypertension  . Penicillins Nausea And Vomiting    Cannot take by mouth  . Tetanus Toxoids Swelling    Fever, flushing and hardening of the injection site  . Topamax Other (See Comments)    Confusion, high blood pressure.   . Vancomycin Nausea Only    Severe chills    Family History History reviewed. No pertinent  family history. Social History  reports that she has never smoked. She has never used smokeless tobacco. She reports that she does not drink alcohol or use illicit drugs.  Review Of Systems:  All systems reviewed and found negative except for what I mentioned in the HPI..   Vital Signs: Temp:  [98.3 F (36.8 C)-99.4 F (37.4 C)] 98.3 F (36.8 C) (02/16 2323) Pulse Rate:  [53-70] 58 (02/17 0130) Resp:  [11-26] 11 (02/17 0130) BP: (96-174)/(53-149) 166/89 mmHg (02/17 0130) SpO2:  [90 %-96 %] 95 % (02/17 0130) Weight:  [252 lb 10.4 oz (114.6 kg)] 252 lb 10.4 oz (114.6 kg) (02/16 2323)  Physical Examination: General:  Awake, no acute distress Neuro:  Oriented X 3, nonfocal HEENT:  PERRL, pink conjunctivae, moist membranes Neck:  Supple, no JVD   Cardiovascular:  RRR, no  M/R/G Lungs:  CTA, no W/R/R Abdomen:  Soft, nontender, nondistended, bowel sounds present. Old surgical scars present. Musculoskeletal:  Moves all extremities, no pedal edema Skin:  No rash  I have reviewed the labs and personally reviewed the radiology imaging since admission.  Results for orders placed during the hospital encounter of 12/12/12 (from the past 24 hour(s))  CBC WITH DIFFERENTIAL     Status: Abnormal   Collection Time    12/12/12  3:42 PM      Result Value Range   WBC 9.7  4.0 - 10.5 K/uL   RBC 4.12  3.87 - 5.11 MIL/uL   Hemoglobin 13.7  12.0 - 15.0 g/dL   HCT 16.1  09.6 - 04.5 %   MCV 97.6  78.0 - 100.0 fL   MCH 33.3  26.0 - 34.0 pg   MCHC 34.1  30.0 - 36.0 g/dL   RDW 40.9  81.1 - 91.4 %   Platelets 298  150 - 400 K/uL   Neutrophils Relative 40 (*) 43 - 77 %   Neutro Abs 3.9  1.7 - 7.7 K/uL   Lymphocytes Relative 49 (*) 12 - 46 %   Lymphs Abs 4.7 (*) 0.7 - 4.0 K/uL   Monocytes Relative 9  3 - 12 %   Monocytes Absolute 0.9  0.1 - 1.0 K/uL   Eosinophils Relative 1  0 - 5 %   Eosinophils Absolute 0.1  0.0 - 0.7 K/uL   Basophils Relative 0  0 - 1 %   Basophils Absolute 0.0  0.0 - 0.1 K/uL  POCT I-STAT TROPONIN I     Status: None   Collection Time    12/12/12  3:58 PM      Result Value Range   Troponin i, poc 0.00  0.00 - 0.08 ng/mL   Comment 3           POCT I-STAT, CHEM 8     Status: Abnormal   Collection Time    12/12/12  4:00 PM      Result Value Range   Sodium 143  135 - 145 mEq/L   Potassium 3.8  3.5 - 5.1 mEq/L   Chloride 104  96 - 112 mEq/L   BUN 10  6 - 23 mg/dL   Creatinine, Ser 7.82  0.50 - 1.10 mg/dL   Glucose, Bld 956 (*) 70 - 99 mg/dL   Calcium, Ion 2.13  0.86 - 1.23 mmol/L   TCO2 29  0 - 100 mmol/L   Hemoglobin 14.3  12.0 - 15.0 g/dL   HCT 57.8  46.9 - 62.9 %  APTT     Status: None  Collection Time    12/12/12  8:34 PM      Result Value Range   aPTT 31  24 - 37 seconds  PROTIME-INR     Status: None   Collection Time    12/12/12   8:34 PM      Result Value Range   Prothrombin Time 13.0  11.6 - 15.2 seconds   INR 0.99  0.00 - 1.49  MRSA PCR SCREENING     Status: None   Collection Time    12/12/12 11:30 PM      Result Value Range   MRSA by PCR NEGATIVE  NEGATIVE    Ventilator Settings:   CTA of the chest:  1. Positive study for pulmonary embolus with pulmonary emboli to  all lobes with right heart strain.  2. Likely peripheral pulmonary infarction in the posterior right  lower lobe.   ECG:  1st degree AV block, RBBB Lines: Peripheral lines     ASSESSMENT AND PLAN  PULMONARY A:   1) Acute recurrent bilateral pulmonary embolism 2) Right heart strain per CTA 3) Hemodynamically stable P:   - Will admit to ICU for close monitoring - Heparin drip per protocol - Supplemental oxygent to keep O2 sat >94% - Will hold on trombolysis - LE doppler  CARDIOVASCULAR A:  1) Acute PE with right heart strain per CTA P:  - Echocardiogram - Will hold antihypertensives - Close monitoring in ICU  RENAL A:   1) No issues   GASTROINTESTINAL A:   1) No issues   HEMATOLOGIC A:   1) Recurrent PE P: - Will get hypercoagulable panel  - Will need life long anticoagulation   INFECTIOUS A:   1) No issues   ENDOCRINE A:   1) No issues     NEUROLOGIC A:   1) No issues   BEST PRACTICE / DISPOSITION - Level of Care:  ICU - Primary Service: PCCM - Consultants:  None - Code Status:  Full code - Diet:  NPO, Sips of water ok - DVT Px:  Heparin drip - GI Px:  Pepcid - Skin Integrity:  Intact    Critical Care Time devoted to patient care services described in this note is: 1 Hour  Overton Mam, M.D. Pulmonary and Critical Care Medicine [facility] Pager: [phone]  12/13/2012, 1:35 AM

## 2012-12-13 NOTE — Progress Notes (Signed)
eLink Physician-Brief Progress Note Patient Name: Susan Howell DOB: Aug 30, 1956 MRN: [id number]  Date of Service  12/13/2012   HPI/Events of Note   Pt requesting that her macrodantin be ordered. No interaction with coumadin, will order  eICU Interventions  Macrodantin 100mg  qd   Intervention Category Minor Interventions: Routine modifications to care plan (e.g. PRN medications for pain, fever)  Princes Finger S. 12/13/2012, 5:37 PM

## 2012-12-13 NOTE — Progress Notes (Signed)
  Echocardiogram 2D Echocardiogram has been performed.  Loni Muse, Nancie Bocanegra A 12/13/2012, 1:32 PM

## 2012-12-13 NOTE — Care Management Note (Signed)
    Page 1 of 1   12/13/2012     8:49:22 AM   CARE MANAGEMENT NOTE 12/13/2012  Patient:  Susan Howell, Susan Howell   Account Number:  [id number]  Date Initiated:  12/13/2012  Documentation initiated by:  Junius Creamer  Subjective/Objective Assessment:   adm w pul embolus     Action/Plan:   lives w husband   Anticipated DC Date:     Anticipated DC Plan:        DC Planning Services  CM consult      Choice offered to / List presented to:             Status of service:   Medicare Important Message given?   (If response is "NO", the following Medicare IM given date fields will be blank) Date Medicare IM given:   Date Additional Medicare IM given:    Discharge Disposition:    Per UR Regulation:  Reviewed for med. necessity/level of care/duration of stay  If discussed at Long Length of Stay Meetings, dates discussed:    Comments:  2/17 0848 debbie Josha Weekley rn,bsn

## 2012-12-13 NOTE — Progress Notes (Signed)
The patient is chronically on home klonopin, MS contin and trazodone.   Restarted; monitor for sedation;

## 2012-12-14 LAB — PROTIME-INR
INR: 1.09 (ref 0.00–1.49)
Prothrombin Time: 14 seconds (ref 11.6–15.2)

## 2012-12-14 LAB — CBC
Hemoglobin: 12.1 g/dL (ref 12.0–15.0)
RBC: 3.77 MIL/uL — ABNORMAL LOW (ref 3.87–5.11)
WBC: 9.9 10*3/uL (ref 4.0–10.5)

## 2012-12-14 LAB — HEPARIN LEVEL (UNFRACTIONATED): Heparin Unfractionated: 0.69 IU/mL (ref 0.30–0.70)

## 2012-12-14 MED ORDER — WARFARIN SODIUM 7.5 MG PO TABS
7.5000 mg | ORAL_TABLET | Freq: Once | ORAL | Status: AC
  Administered 2012-12-14: 7.5 mg via ORAL
  Filled 2012-12-14: qty 1

## 2012-12-14 NOTE — Progress Notes (Addendum)
Pt profile: 62 F with remote hx of PE. Admitted with large bilateral PEs 2/16. Never HDly unstable  Active problems: Principal Problem:   Pulmonary embolism Active Problems:   Obesity   Sedentary lifestyle   Hypertension   Hx pulmonary embolism, 1979   Studies/Events: 2/16 CTA chest: Positive study for pulmonary embolus with pulmonary emboli to all lobes   Heparin gtt started 2/17 Warfarin started 2/17 Echo:- LVEF 65% to 70%. Grade 2 diastolic dysfunction. PA peak pressure: 38mm Hg (S). 2/17 LE duplex:   Subj: No distress. No new complaints, Wants to talk about other anticoagulants IE xarelto.  Obj: Filed Vitals:   12/14/12 1121  BP: 126/76  Pulse: 78  Temp:   Resp:     Gen: Obese, NAD HEENT: WNL Neck: no JVD seen Chest: clear Cardiac: RRR s M ZOX:WRUE, NT, NABS Ext: no pitting edema  BMET    Component Value Date/Time   NA 143 12/12/2012 1600   K 3.8 12/12/2012 1600   CL 104 12/12/2012 1600   CO2 27 03/20/2011 1752   GLUCOSE 100* 12/12/2012 1600   BUN 10 12/12/2012 1600   CREATININE 0.90 12/12/2012 1600   CALCIUM 9.3 03/20/2011 1752   GFRNONAA >60 03/20/2011 1752   GFRAA  Value: >60        The eGFR has been calculated using the MDRD equation. This calculation has not been validated in all clinical situations. eGFR's persistently <60 mL/min signify possible Chronic Kidney Disease. 03/20/2011 1752    CBC    Component Value Date/Time   WBC 9.9 12/14/2012 0505   RBC 3.77* 12/14/2012 0505   HGB 12.1 12/14/2012 0505   HCT 36.7 12/14/2012 0505   PLT 305 12/14/2012 0505   MCV 97.3 12/14/2012 0505   MCH 32.1 12/14/2012 0505   MCHC 33.0 12/14/2012 0505   RDW 14.2 12/14/2012 0505   LYMPHSABS 4.7* 12/12/2012 1542   MONOABS 0.9 12/12/2012 1542   EOSABS 0.1 12/12/2012 1542   BASOSABS 0.0 12/12/2012 1542    CXR:  No new film   IMPRESSION: Principal Problem:   Pulmonary embolism Active Problems:   Obesity   Sedentary lifestyle   Hypertension   Hx pulmonary embolism,  1979  PE RF likely obesity, sedentary lifestyle, prior VTE (1979 after gastric bypass surgery) BP better controlled  PLAN/RECS:  -Cont tele -Cont warfarin - consider alternatives -As this is second event and she will have persistent RFs, lifelong anticoagulation is indicated -Cont heparin until therapeutic on oral anticoagulation -F/U venous dopplers -Cont home anti hypertensives  Brett Canales Minor ACNP Adolph Pollack PCCM Pager [phone] till 3 pm If no answer page [phone] 12/14/2012, 11:59 AM   I have interviewed and examined the patient and reviewed the database. I have formulated the assessment and plan as reflected in the note above with amendments made by me.   Billy Fischer, MD;  PCCM service; Mobile [phone]   ADD: Discussed long term anticoagulation. Will cont warfarin   Billy Fischer, MD ; [facility] [phone].  After 5:30 PM or weekends, call [phone]

## 2012-12-14 NOTE — Progress Notes (Signed)
ANTICOAGULATION CONSULT NOTE - Follow Up Consult  Pharmacy Consult for Heparin + Warfarin Indication: Extensive bilateral PE   Allergies  Allergen Reactions  . Codeine Itching  . Aliskiren Fumarate     REACTION: ANXIETY // SYNCOPE  . Eszopiclone     REACTION: NIGHT MARES  . Iodine Other (See Comments)    seizures  . Iohexol      Desc: NAUSEA,VOMITING & SZ S/P CONTRAST INJ 30 YRS AGO// OK W/ PRE MEDS, BENADRYL & SOLUMEDROL TODAY//A.C., Onset Date: 16109604   . Labetalol Hcl Hypertension  . Penicillins Nausea And Vomiting    Cannot take by mouth  . Tetanus Toxoids Swelling    Fever, flushing and hardening of the injection site  . Topamax Other (See Comments)    Confusion, high blood pressure.   . Vancomycin Nausea Only    Severe chills    Patient Measurements: Height: 5' (152.4 cm) Weight: 252 lb 10.4 oz (114.6 kg) IBW/kg (Calculated) : 45.5 Heparin Dosing Weight: 74.2 kg  Vital Signs: Temp: 98.1 F (36.7 C) (02/18 1347) Temp src: Oral (02/18 1347) BP: 150/87 mmHg (02/18 1347) Pulse Rate: 50 (02/18 1347)  Labs:  Recent Labs  12/12/12 1542 12/12/12 1600 12/12/12 2034  12/13/12 0254 12/13/12 1015 12/13/12 1639 12/14/12 0505 12/14/12 1531  HGB 13.7 14.3  --   --  13.6  --   --  12.1  --   HCT 40.2 42.0  --   --  39.4  --   --  36.7  --   PLT 298  --   --   --  282  --   --  305  --   APTT  --   --  31  --   --   --   --   --   --   LABPROT  --   --  13.0  --   --  13.7  --   --  14.0  INR  --   --  0.99  --   --  1.06  --   --  1.09  HEPARINUNFRC  --   --   --   < > 1.00* 0.65 0.57 0.69  --   CREATININE  --  0.90  --   --   --   --   --   --   --   < > = values in this interval not displayed.  Estimated Creatinine Clearance: 79.6 ml/min (by C-G formula based on Cr of 0.9).   Medications:  Heparin at 1100 units/hr (11 ml/hr)  Assessment: 57 y.o. F on heparin + warfarin for new extensive bilateral pulmonary emoblus. Today is VTE overlap D#2/5 as  recommended per CHEST guidelines. The patient has hx prior PE/DVT however was not on chronic anticoagulation PTA. Heparin level this morning is therapeutic (HL 0.69, goal of 0.3-0.7), INR today remains SUBtherapeutic (INR 1.09 << 1.06, goal of 2-3). Hgb/Hct slight drop, no overt s/sx of bleeding noted.   Will plan on educating the patient on warfarin this admission.  Goal of Therapy:  INR 2-3 Heparin level 0.3-0.7 units/ml Monitor platelets by anticoagulation protocol: Yes   Plan:  1. Continue heparin drip at current rate of 1100 units/hr (11 ml/hr) 2. Warfarin 7.5 mg x 1 dose at 1800 today 3. Will continue to monitor for any signs/symptoms of bleeding and will follow up with heparin level and PT/INR in the a.m  Georgina Pillion, PharmD, BCPS Clinical Pharmacist Pager: [phone] 12/14/2012 4:24 PM

## 2012-12-15 LAB — CBC
MCH: 32.4 pg (ref 26.0–34.0)
Platelets: 304 10*3/uL (ref 150–400)
RBC: 3.83 MIL/uL — ABNORMAL LOW (ref 3.87–5.11)
WBC: 7.4 10*3/uL (ref 4.0–10.5)

## 2012-12-15 LAB — HEPARIN LEVEL (UNFRACTIONATED): Heparin Unfractionated: 0.36 IU/mL (ref 0.30–0.70)

## 2012-12-15 LAB — PROTIME-INR: Prothrombin Time: 15.7 seconds — ABNORMAL HIGH (ref 11.6–15.2)

## 2012-12-15 MED ORDER — WARFARIN SODIUM 10 MG PO TABS
10.0000 mg | ORAL_TABLET | Freq: Once | ORAL | Status: AC
  Administered 2012-12-15: 10 mg via ORAL
  Filled 2012-12-15: qty 1

## 2012-12-15 MED ORDER — DOCUSATE SODIUM 100 MG PO CAPS
200.0000 mg | ORAL_CAPSULE | Freq: Every day | ORAL | Status: DC
  Administered 2012-12-15 – 2012-12-16 (×2): 200 mg via ORAL
  Filled 2012-12-15 (×5): qty 2

## 2012-12-15 MED ORDER — ONDANSETRON HCL 4 MG/2ML IJ SOLN
4.0000 mg | Freq: Four times a day (QID) | INTRAMUSCULAR | Status: DC | PRN
  Administered 2012-12-15: 4 mg via INTRAVENOUS
  Filled 2012-12-15: qty 2

## 2012-12-15 NOTE — Progress Notes (Signed)
Pt profile: 27 F with remote hx of PE. Admitted with large bilateral PEs 2/16. Never HDly unstable  Active problems: Principal Problem:   Pulmonary embolism Active Problems:   Obesity   Sedentary lifestyle   Hypertension   Hx pulmonary embolism, 1979   Studies/Events: 2/16 CTA chest: Positive study for pulmonary embolus with pulmonary emboli to all lobes   Heparin gtt started 2/17 Warfarin started 2/17 Echo:- LVEF 65% to 70%. Grade 2 diastolic dysfunction. PA peak pressure: 38mm Hg (S). 2/17 LE duplex: prelim report > no DVT seen  Subj: No distress. No new complaints, Wants to talk about other anticoagulants IE xarelto.  Obj: Filed Vitals:   12/15/12 0518  BP: 114/74  Pulse: 55  Temp: 97.9 F (36.6 C)  Resp: 18    Gen: Obese, NAD HEENT: WNL Neck: no JVD seen Chest: clear Cardiac: RRR s M WGN:FAOZ, NT, NABS Ext: no pitting edema  BMET    Component Value Date/Time   NA 143 12/12/2012 1600   K 3.8 12/12/2012 1600   CL 104 12/12/2012 1600   CO2 27 03/20/2011 1752   GLUCOSE 100* 12/12/2012 1600   BUN 10 12/12/2012 1600   CREATININE 0.90 12/12/2012 1600   CALCIUM 9.3 03/20/2011 1752   GFRNONAA >60 03/20/2011 1752   GFRAA  Value: >60        The eGFR has been calculated using the MDRD equation. This calculation has not been validated in all clinical situations. eGFR's persistently <60 mL/min signify possible Chronic Kidney Disease. 03/20/2011 1752    CBC    Component Value Date/Time   WBC 7.4 12/15/2012 0540   RBC 3.83* 12/15/2012 0540   HGB 12.4 12/15/2012 0540   HCT 37.3 12/15/2012 0540   PLT 304 12/15/2012 0540   MCV 97.4 12/15/2012 0540   MCH 32.4 12/15/2012 0540   MCHC 33.2 12/15/2012 0540   RDW 14.1 12/15/2012 0540   LYMPHSABS 4.7* 12/12/2012 1542   MONOABS 0.9 12/12/2012 1542   EOSABS 0.1 12/12/2012 1542   BASOSABS 0.0 12/12/2012 1542   Lab Results  Component Value Date   INR 1.28 12/15/2012   INR 1.09 12/14/2012   INR 1.06 12/13/2012    CXR:  No results  found.   IMPRESSION: Principal Problem:   Pulmonary embolism Active Problems:   Obesity   Sedentary lifestyle   Hypertension   Hx pulmonary embolism, 1979  PE RF likely obesity, sedentary lifestyle, prior VTE (1979 after gastric bypass surgery) BP better controlled  PLAN/RECS:  -Cont tele -Cont warfarin - -As this is second event and she will have persistent RFs, lifelong anticoagulation is indicated -Cont heparin until therapeutic on oral anticoagulation -Cont home anti hypertensive's   ADD: we can consider D/C on LMWH bridge to warfarin in next day or two   Susan Howell ACNP Susan Howell PCCM Pager [phone] till 3 pm If no answer page [phone] 12/15/2012, 8:57 AM   I have interviewed and examined the patient and reviewed the database. I have formulated the assessment and plan as reflected in the note above with amendments made by me.   Susan Fischer, MD;  PCCM service; Mobile [phone]

## 2012-12-15 NOTE — Progress Notes (Signed)
ANTICOAGULATION CONSULT NOTE - Follow Up Consult  Pharmacy Consult for Heparin + Warfarin Indication: Extensive bilateral PE   Allergies  Allergen Reactions  . Codeine Itching  . Aliskiren Fumarate     REACTION: ANXIETY // SYNCOPE  . Eszopiclone     REACTION: NIGHT MARES  . Iodine Other (See Comments)    seizures  . Iohexol      Desc: NAUSEA,VOMITING & SZ S/P CONTRAST INJ 30 YRS AGO// OK W/ PRE MEDS, BENADRYL & SOLUMEDROL TODAY//A.C., Onset Date: 16109604   . Labetalol Hcl Hypertension  . Penicillins Nausea And Vomiting    Cannot take by mouth  . Tetanus Toxoids Swelling    Fever, flushing and hardening of the injection site  . Topamax Other (See Comments)    Confusion, high blood pressure.   . Vancomycin Nausea Only    Severe chills    Patient Measurements: Height: 5' (152.4 cm) Weight: 252 lb 10.4 oz (114.6 kg) IBW/kg (Calculated) : 45.5 Heparin Dosing Weight: 74.2 kg  Vital Signs: Temp: 97.9 F (36.6 C) (02/19 0518) Temp src: Oral (02/19 0518) BP: 131/74 mmHg (02/19 1028) Pulse Rate: 55 (02/19 0518)  Labs:  Recent Labs  12/12/12 1600  12/12/12 2034  12/13/12 0254 12/13/12 1015 12/13/12 1639 12/14/12 0505 12/14/12 1531 12/15/12 0540  HGB 14.3  --   --   --  13.6  --   --  12.1  --  12.4  HCT 42.0  --   --   --  39.4  --   --  36.7  --  37.3  PLT  --   --   --   --  282  --   --  305  --  304  APTT  --   --  31  --   --   --   --   --   --   --   LABPROT  --   < > 13.0  --   --  13.7  --   --  14.0 15.7*  INR  --   < > 0.99  --   --  1.06  --   --  1.09 1.28  HEPARINUNFRC  --   --   --   < > 1.00* 0.65 0.57 0.69  --  0.36  CREATININE 0.90  --   --   --   --   --   --   --   --   --   < > = values in this interval not displayed.  Estimated Creatinine Clearance: 79.6 ml/min (by C-G formula based on Cr of 0.9).   Medications:  Heparin at 1100 units/hr (11 ml/hr)  Assessment: 57 y.o. F on heparin + warfarin for new extensive bilateral pulmonary  emoblus. Today is VTE overlap D#3/5 as recommended per CHEST guidelines. The patient has hx prior PE/DVT however was not on chronic anticoagulation PTA. Heparin level this morning is therapeutic (HL 0.36 << 0.69, goal of 0.3-0.7), INR today remains SUBtherapeutic though trending up (INR 1.28 << 1.09, goal of 2-3). Hgb/Hct slight drop, no overt s/sx of bleeding noted.   The patient was educated on warfarin today -- all questions were addressed.  Goal of Therapy:  INR 2-3 Heparin level 0.3-0.7 units/ml Monitor platelets by anticoagulation protocol: Yes   Plan:  1. Continue heparin drip at current rate of 1100 units/hr (11 ml/hr) 2. Warfarin 10 mg x 1 dose at 1800 today 3. Will continue to monitor for any signs/symptoms  of bleeding and will follow up with heparin level and PT/INR in the a.m  Georgina Pillion, PharmD, BCPS Clinical Pharmacist Pager: [phone] 12/15/2012 11:48 AM

## 2012-12-16 LAB — CBC
HCT: 38.8 % (ref 36.0–46.0)
Hemoglobin: 12.6 g/dL (ref 12.0–15.0)
MCV: 98 fL (ref 78.0–100.0)
Platelets: 285 10*3/uL (ref 150–400)
RBC: 3.96 MIL/uL (ref 3.87–5.11)
WBC: 6.6 10*3/uL (ref 4.0–10.5)

## 2012-12-16 LAB — PROTIME-INR: Prothrombin Time: 19.6 seconds — ABNORMAL HIGH (ref 11.6–15.2)

## 2012-12-16 LAB — HEPARIN LEVEL (UNFRACTIONATED): Heparin Unfractionated: 0.26 IU/mL — ABNORMAL LOW (ref 0.30–0.70)

## 2012-12-16 MED ORDER — LISINOPRIL 5 MG PO TABS
5.0000 mg | ORAL_TABLET | Freq: Every day | ORAL | Status: DC
  Administered 2012-12-17 – 2012-12-18 (×2): 5 mg via ORAL
  Filled 2012-12-16 (×2): qty 1

## 2012-12-16 MED ORDER — HEPARIN BOLUS VIA INFUSION
1000.0000 [IU] | Freq: Once | INTRAVENOUS | Status: AC
  Administered 2012-12-16: 1000 [IU] via INTRAVENOUS
  Filled 2012-12-16: qty 1000

## 2012-12-16 MED ORDER — WARFARIN SODIUM 10 MG PO TABS
10.0000 mg | ORAL_TABLET | Freq: Once | ORAL | Status: AC
  Administered 2012-12-16: 10 mg via ORAL
  Filled 2012-12-16: qty 1

## 2012-12-16 MED ORDER — KETOROLAC TROMETHAMINE 30 MG/ML IJ SOLN
30.0000 mg | Freq: Once | INTRAMUSCULAR | Status: AC
  Administered 2012-12-16: 30 mg via INTRAVENOUS
  Filled 2012-12-16: qty 1

## 2012-12-16 NOTE — Progress Notes (Signed)
ANTICOAGULATION CONSULT NOTE - Follow Up Consult  Pharmacy Consult for Heparin + Warfarin Indication: Extensive bilateral PE   Allergies  Allergen Reactions  . Codeine Itching  . Aliskiren Fumarate     REACTION: ANXIETY // SYNCOPE  . Eszopiclone     REACTION: NIGHT MARES  . Iodine Other (See Comments)    seizures  . Iohexol      Desc: NAUSEA,VOMITING & SZ S/P CONTRAST INJ 30 YRS AGO// OK W/ PRE MEDS, BENADRYL & SOLUMEDROL TODAY//A.C., Onset Date: 16109604   . Labetalol Hcl Hypertension  . Penicillins Nausea And Vomiting    Cannot take by mouth  . Tetanus Toxoids Swelling    Fever, flushing and hardening of the injection site  . Topamax Other (See Comments)    Confusion, high blood pressure.   . Vancomycin Nausea Only    Severe chills    Patient Measurements: Height: 5' (152.4 cm) Weight: 252 lb 10.4 oz (114.6 kg) IBW/kg (Calculated) : 45.5 Heparin Dosing Weight: 74.2 kg  Vital Signs: Temp: 97.8 F (36.6 C) (02/20 0354) Temp src: Oral (02/20 0354) BP: 143/85 mmHg (02/20 1005) Pulse Rate: 62 (02/20 1005)  Labs:  Recent Labs  12/14/12 0505 12/14/12 1531 12/15/12 0540 12/16/12 0445 12/16/12 0620 12/16/12 1147  HGB 12.1  --  12.4  --  12.6  --   HCT 36.7  --  37.3  --  38.8  --   PLT 305  --  304  --  285  --   LABPROT  --  14.0 15.7* 19.6*  --   --   INR  --  1.09 1.28 1.72*  --   --   HEPARINUNFRC 0.69  --  0.36 0.26*  --  0.43    Estimated Creatinine Clearance: 79.6 ml/min (by C-G formula based on Cr of 0.9).   Medications:  Heparin at 1250 units/hr (12.5 ml/hr)  Assessment: 57 y.o. F on heparin + warfarin for new extensive bilateral pulmonary emoblus. Today is VTE minimum overlap D#4/5. INR is 1.72. Heparin level (0.43) is therapeutic. No bleeding reported. CBC stable.   Goal of Therapy:  INR 2-3 Heparin level 0.3-0.7 units/ml Monitor platelets by anticoagulation protocol: Yes   Plan:  1. Continue IV heparin at 1250 units/hr.  3. Coumadin 10  mg po today.   4. Daily CBC, heparin level, PT / INR  Christoper Fabian, PharmD, BCPS Clinical pharmacist, pager [phone] 12/16/2012 1:39 PM

## 2012-12-16 NOTE — Progress Notes (Signed)
Pt profile: 53 F with remote hx of PE. Admitted with large bilateral PEs 2/16. Never HDly unstable  Active problems: Principal Problem:   Pulmonary embolism Active Problems:   Obesity   Sedentary lifestyle   Hypertension   Hx pulmonary embolism, 1979   Studies/Events: 2/16 CTA chest: Positive study for pulmonary embolus with pulmonary emboli to all lobes   Heparin gtt started 2/17 Warfarin started 2/17 Echo:- LVEF 65% to 70%. Grade 2 diastolic dysfunction. PA peak pressure: 38mm Hg (S). 2/17 LE duplex: prelim report > no DVT seen  Subj: No distress. Requested note for husband for court case. Provided per S. Minor ACNP  Obj: Filed Vitals:   12/16/12 1005  BP: 143/85  Pulse: 62  Temp:   Resp:     Gen: Obese, NAD HEENT: WNL Neck: no JVD seen Chest: clear Cardiac: RRR s M UJW:JXBJ, NT, NABS Ext: no pitting edema  BMET    Component Value Date/Time   NA 143 12/12/2012 1600   K 3.8 12/12/2012 1600   CL 104 12/12/2012 1600   CO2 27 03/20/2011 1752   GLUCOSE 100* 12/12/2012 1600   BUN 10 12/12/2012 1600   CREATININE 0.90 12/12/2012 1600   CALCIUM 9.3 03/20/2011 1752   GFRNONAA >60 03/20/2011 1752   GFRAA  Value: >60        The eGFR has been calculated using the MDRD equation. This calculation has not been validated in all clinical situations. eGFR's persistently <60 mL/min signify possible Chronic Kidney Disease. 03/20/2011 1752    CBC    Component Value Date/Time   WBC 6.6 12/16/2012 0620   RBC 3.96 12/16/2012 0620   HGB 12.6 12/16/2012 0620   HCT 38.8 12/16/2012 0620   PLT 285 12/16/2012 0620   MCV 98.0 12/16/2012 0620   MCH 31.8 12/16/2012 0620   MCHC 32.5 12/16/2012 0620   RDW 14.3 12/16/2012 0620   LYMPHSABS 4.7* 12/12/2012 1542   MONOABS 0.9 12/12/2012 1542   EOSABS 0.1 12/12/2012 1542   BASOSABS 0.0 12/12/2012 1542   Lab Results  Component Value Date   INR 1.72* 12/16/2012   INR 1.28 12/15/2012   INR 1.09 12/14/2012    CXR:  No results  found.   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   Brett Canales Minor ACNP [facility] PCCM Pager [phone] till 3 pm If no answer page [phone] 12/16/2012, 11:49 AM    I have interviewed and examined the patient and reviewed the database. I have formulated the assessment and plan as reflected in the note above with amendments made by me.   Billy Fischer, MD;  PCCM service; Mobile [phone]

## 2012-12-16 NOTE — Progress Notes (Signed)
ANTICOAGULATION CONSULT NOTE - Follow Up Consult  Pharmacy Consult for Heparin + Warfarin Indication: Extensive bilateral PE   Allergies  Allergen Reactions  . Codeine Itching  . Aliskiren Fumarate     REACTION: ANXIETY // SYNCOPE  . Eszopiclone     REACTION: NIGHT MARES  . Iodine Other (See Comments)    seizures  . Iohexol      Desc: NAUSEA,VOMITING & SZ S/P CONTRAST INJ 30 YRS AGO// OK W/ PRE MEDS, BENADRYL & SOLUMEDROL TODAY//A.C., Onset Date: [id number]   . Labetalol Hcl Hypertension  . Penicillins Nausea And Vomiting    Cannot take by mouth  . Tetanus Toxoids Swelling    Fever, flushing and hardening of the injection site  . Topamax Other (See Comments)    Confusion, high blood pressure.   . Vancomycin Nausea Only    Severe chills    Patient Measurements: Height: 5' (152.4 cm) Weight: 252 lb 10.4 oz (114.6 kg) IBW/kg (Calculated) : 45.5 Heparin Dosing Weight: 74.2 kg  Vital Signs: Temp: 97.8 F (36.6 C) (02/20 0354) Temp src: Oral (02/20 0354) BP: 85/53 mmHg (02/20 0354) Pulse Rate: 52 (02/20 0354)  Labs:  Recent Labs  12/14/12 0505 12/14/12 1531 12/15/12 0540 12/16/12 0445  HGB 12.1  --  12.4  --   HCT 36.7  --  37.3  --   PLT 305  --  304  --   LABPROT  --  14.0 15.7* 19.6*  INR  --  1.09 1.28 1.72*  HEPARINUNFRC 0.69  --  0.36 0.26*    Estimated Creatinine Clearance: 79.6 ml/min (by C-G formula based on Cr of 0.9).   Medications:  Heparin at 1100 units/hr (11 ml/hr)  Assessment: 57 y.o. F on heparin + warfarin for new extensive bilateral pulmonary emoblus. Today is VTE overlap D#4/5. INR is 1.72. Heparin level (0.26) is below-goal on 1100 units/hr. No problem with line / infusion and no bleeding per RN.   Goal of Therapy:  INR 2-3 Heparin level 0.3-0.7 units/ml Monitor platelets by anticoagulation protocol: Yes   Plan:  1. Heparin IV bolus of 1000 units, then increase IV heparin to 1250 units/hr.  2. Heparin level in 6 hours.  3.  Coumadin 10 mg po today.   4. Daily CBC, heparin level, PT / INR  Lorre Munroe, PharmD 12/16/2012 6:03 AM

## 2012-12-17 LAB — GLUCOSE, CAPILLARY: Glucose-Capillary: 97 mg/dL (ref 70–99)

## 2012-12-17 LAB — CBC
Hemoglobin: 12.7 g/dL (ref 12.0–15.0)
MCH: 32.6 pg (ref 26.0–34.0)
RBC: 3.89 MIL/uL (ref 3.87–5.11)

## 2012-12-17 LAB — PROTIME-INR: Prothrombin Time: 26.4 seconds — ABNORMAL HIGH (ref 11.6–15.2)

## 2012-12-17 MED ORDER — WARFARIN SODIUM 2.5 MG PO TABS
2.5000 mg | ORAL_TABLET | Freq: Once | ORAL | Status: AC
  Administered 2012-12-17: 2.5 mg via ORAL
  Filled 2012-12-17: qty 1

## 2012-12-17 NOTE — Progress Notes (Signed)
Pt profile: 64 F with remote hx of PE. Admitted with large bilateral PEs 2/16. Never HDly unstable  Active problems: Principal Problem:   Pulmonary embolism Active Problems:   Obesity   Sedentary lifestyle   Hypertension   Hx pulmonary embolism, 1979   Studies/Events: 2/16 CTA chest: Positive study for pulmonary embolus with pulmonary emboli to all lobes   Heparin gtt started 2/17 Warfarin started 2/17 Echo:- LVEF 65% to 70%. Grade 2 diastolic dysfunction. PA peak pressure: 38mm Hg (S). 2/17 LE duplex: prelim report > no DVT seen  Subj: No distress.   Obj: Filed Vitals:   12/17/12 0940  BP: 120/70  Pulse: 60  Temp:   Resp: 18    Gen: Obese, NAD HEENT: WNL Neck: no JVD seen Chest: clear Cardiac: RRR s M NWG:NFAO, NT, NABS Ext: no pitting edema  BMET    Component Value Date/Time   NA 143 12/12/2012 1600   K 3.8 12/12/2012 1600   CL 104 12/12/2012 1600   CO2 27 03/20/2011 1752   GLUCOSE 100* 12/12/2012 1600   BUN 10 12/12/2012 1600   CREATININE 0.90 12/12/2012 1600   CALCIUM 9.3 03/20/2011 1752   GFRNONAA >60 03/20/2011 1752   GFRAA  Value: >60        The eGFR has been calculated using the MDRD equation. This calculation has not been validated in all clinical situations. eGFR's persistently <60 mL/min signify possible Chronic Kidney Disease. 03/20/2011 1752    CBC    Component Value Date/Time   WBC 7.8 12/17/2012 0425   RBC 3.89 12/17/2012 0425   HGB 12.7 12/17/2012 0425   HCT 37.2 12/17/2012 0425   PLT 300 12/17/2012 0425   MCV 95.6 12/17/2012 0425   MCH 32.6 12/17/2012 0425   MCHC 34.1 12/17/2012 0425   RDW 14.1 12/17/2012 0425   LYMPHSABS 4.7* 12/12/2012 1542   MONOABS 0.9 12/12/2012 1542   EOSABS 0.1 12/12/2012 1542   BASOSABS 0.0 12/12/2012 1542   Lab Results  Component Value Date   INR 2.58* 12/17/2012   INR 1.72* 12/16/2012   INR 1.28 12/15/2012    CXR:  No results found.   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 I have formulated the assessment and plan as reflected in the note above with amendments made by me.   She should be ready for discharge to home 2/22 assuming that her PT is in therapeutic range  Billy Fischer, MD;  PCCM service; Mobile [phone]

## 2012-12-17 NOTE — Progress Notes (Signed)
ANTICOAGULATION CONSULT NOTE - Follow Up Consult  Pharmacy Consult for Heparin + Warfarin Indication: Extensive bilateral PE   Allergies  Allergen Reactions  . Codeine Itching  . Aliskiren Fumarate     REACTION: ANXIETY // SYNCOPE  . Eszopiclone     REACTION: NIGHT MARES  . Iodine Other (See Comments)    seizures  . Iohexol      Desc: NAUSEA,VOMITING & SZ S/P CONTRAST INJ 30 YRS AGO// OK W/ PRE MEDS, BENADRYL & SOLUMEDROL TODAY//A.C., Onset Date: [id number]   . Labetalol Hcl Hypertension  . Penicillins Nausea And Vomiting    Cannot take by mouth  . Tetanus Toxoids Swelling    Fever, flushing and hardening of the injection site  . Topamax Other (See Comments)    Confusion, high blood pressure.   . Vancomycin Nausea Only    Severe chills    Patient Measurements: Height: 5' (152.4 cm) Weight: 252 lb 10.4 oz (114.6 kg) IBW/kg (Calculated) : 45.5 Heparin Dosing Weight: 74.2 kg  Vital Signs: Temp: 98.4 F (36.9 C) (02/21 0428) Temp src: Oral (02/21 0428) BP: 120/70 mmHg (02/21 0940) Pulse Rate: 60 (02/21 0940)  Labs:  Recent Labs  12/15/12 0540 12/16/12 0445 12/16/12 0620 12/16/12 1147 12/17/12 0425  HGB 12.4  --  12.6  --  12.7  HCT 37.3  --  38.8  --  37.2  PLT 304  --  285  --  300  LABPROT 15.7* 19.6*  --   --  26.4*  INR 1.28 1.72*  --   --  2.58*  HEPARINUNFRC 0.36 0.26*  --  0.43 0.33    Estimated Creatinine Clearance: 79.6 ml/min (by C-G formula based on Cr of 0.9).  Assessment: 57 y.o. F on heparin + warfarin for new extensive bilateral pulmonary emoblus. Today is VTE minimum overlap D#5/5. INR is 2.58 - therapeutic (large jump in past 24 hours) Heparin level (0.33) is also therapeutic. No bleeding reported. CBC stable.   Goal of Therapy:  INR 2-3 Heparin level 0.3-0.7 units/ml Monitor platelets by anticoagulation protocol: Yes   Plan:  1. Continue IV heparin at 1250 units/hr. Likely can d/c tomorrow if INR remains >/= 2. 3. Decrease coumadin to  2.5 mg po today due to large jump in INR yesterday   4. Daily CBC, heparin level, PT / INR  Christoper Fabian, PharmD, BCPS Clinical pharmacist, pager [phone] 12/17/2012 11:21 AM

## 2012-12-18 DIAGNOSIS — Z950 Presence of cardiac pacemaker: Secondary | ICD-10-CM

## 2012-12-18 DIAGNOSIS — I495 Sick sinus syndrome: Secondary | ICD-10-CM

## 2012-12-18 DIAGNOSIS — R079 Chest pain, unspecified: Secondary | ICD-10-CM

## 2012-12-18 LAB — CBC
HCT: 38.8 % (ref 36.0–46.0)
Platelets: 239 10*3/uL (ref 150–400)
RDW: 14.3 % (ref 11.5–15.5)
WBC: 7.7 10*3/uL (ref 4.0–10.5)

## 2012-12-18 LAB — PROTIME-INR: INR: 2.14 — ABNORMAL HIGH (ref 0.00–1.49)

## 2012-12-18 LAB — HEPARIN LEVEL (UNFRACTIONATED): Heparin Unfractionated: 0.36 IU/mL (ref 0.30–0.70)

## 2012-12-18 MED ORDER — WARFARIN SODIUM 5 MG PO TABS: ORAL_TABLET | ORAL | Status: DC

## 2012-12-18 MED ORDER — OXYCODONE-ACETAMINOPHEN 10-650 MG PO TABS: 1.0000 | ORAL_TABLET | Freq: Four times a day (QID) | ORAL | Status: DC | PRN

## 2012-12-18 MED ORDER — WARFARIN SODIUM 7.5 MG PO TABS
7.5000 mg | ORAL_TABLET | Freq: Once | ORAL | Status: DC
  Filled 2012-12-18: qty 1

## 2012-12-18 MED ORDER — METOPROLOL TARTRATE 50 MG PO TABS
50.0000 mg | ORAL_TABLET | Freq: Two times a day (BID) | ORAL | Status: DC
  Filled 2012-12-18: qty 1

## 2012-12-18 NOTE — Progress Notes (Signed)
Pt profile: 15 F with remote hx of PE. Admitted with large bilateral PEs 2/16. Never HDly unstable  Active problems: Principal Problem:   Pulmonary embolism Active Problems:   Obesity   Sedentary lifestyle   Hypertension   Hx pulmonary embolism, 1979   Studies/Events: 2/16 CTA chest: Positive study for pulmonary embolus with pulmonary emboli to all lobes   Heparin gtt started 2/17 Warfarin started 2/17 Echo:- LVEF 65% to 70%. Grade 2 diastolic dysfunction. PA peak pressure: 38mm Hg (S). 2/17 LE duplex: prelim report > no DVT seen 12/17/12- HR to 36 despite pacemaker. Cards/ Dr Johney Frame consulted  Subj: Noticing some R lateral chest pains over last 2 days- tender to pressure and with deep breath.  Obj: Filed Vitals:   12/18/12 0546  BP: 94/61  Pulse: 58  Temp: 98.3 F (36.8 C)  Resp: 18    Gen: Obese, NAD, conversational on phone. HEENT: WNL Neck: no JVD seen Chest: clear Cardiac: RRR s M AVW:UJWJ, NT, NABS Ext: no pitting edema  BMET    Component Value Date/Time   NA 143 12/12/2012 1600   K 3.8 12/12/2012 1600   CL 104 12/12/2012 1600   CO2 27 03/20/2011 1752   GLUCOSE 100* 12/12/2012 1600   BUN 10 12/12/2012 1600   CREATININE 0.90 12/12/2012 1600   CALCIUM 9.3 03/20/2011 1752   GFRNONAA >60 03/20/2011 1752   GFRAA  Value: >60        The eGFR has been calculated using the MDRD equation. This calculation has not been validated in all clinical situations. eGFR's persistently <60 mL/min signify possible Chronic Kidney Disease. 03/20/2011 1752    CBC    Component Value Date/Time   WBC 7.7 12/18/2012 0605   RBC 3.99 12/18/2012 0605   HGB 12.9 12/18/2012 0605   HCT 38.8 12/18/2012 0605   PLT 239 12/18/2012 0605   MCV 97.2 12/18/2012 0605   MCH 32.3 12/18/2012 0605   MCHC 33.2 12/18/2012 0605   RDW 14.3 12/18/2012 0605   LYMPHSABS 4.7* 12/12/2012 1542   MONOABS 0.9 12/12/2012 1542   EOSABS 0.1 12/12/2012 1542   BASOSABS 0.0 12/12/2012 1542   Lab Results  Component Value Date    INR 2.14* 12/18/2012   INR 2.58* 12/17/2012   INR 1.72* 12/16/2012    CXR:  No results found.   IMPRESSION: Principal Problem:   Pulmonary embolism Active Problems:   Obesity   Sedentary lifestyle   Hypertension   Hx pulmonary embolism, 1979 Sinoatrial dysfunction-    HR reported down to 36 on 2/21 PM after metoprolol, despite pacer set at 50. Dr Allred/ Cardiology consulted and will get pacer checked.   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 Now in range. -As this is second event and she will have persistent RFs, lifelong anticoagulation is indicated -Cont heparin until therapeutic on oral anticoagulation -Cont home anti hypertensive's -2-21 hold am lopressors due to hr 50 -2/22- Cardiology consulted to check pacemaker. Limit given to nurse for BP to hold metoprolol. Will defer discharge today.  CD Maple Hudson, MD PCCM [phone]

## 2012-12-18 NOTE — Progress Notes (Signed)
ANTICOAGULATION CONSULT NOTE - Follow Up Consult  Pharmacy Consult for Heparin + Warfarin Indication: Extensive bilateral PE   Allergies  Allergen Reactions  . Codeine Itching  . Aliskiren Fumarate     REACTION: ANXIETY // SYNCOPE  . Eszopiclone     REACTION: NIGHT MARES  . Iodine Other (See Comments)    seizures  . Iohexol      Desc: NAUSEA,VOMITING & SZ S/P CONTRAST INJ 30 YRS AGO// OK W/ PRE MEDS, BENADRYL & SOLUMEDROL TODAY//A.C., Onset Date: [id number]   . Labetalol Hcl Hypertension  . Penicillins Nausea And Vomiting    Cannot take by mouth  . Tetanus Toxoids Swelling    Fever, flushing and hardening of the injection site  . Topamax Other (See Comments)    Confusion, high blood pressure.   . Vancomycin Nausea Only    Severe chills    Patient Measurements: Height: 5' (152.4 cm) Weight: 252 lb 10.4 oz (114.6 kg) IBW/kg (Calculated) : 45.5 Heparin Dosing Weight: 74.2 kg  Vital Signs: Temp: 98.3 F (36.8 C) (02/22 0546) Temp src: Oral (02/22 0546) BP: 122/59 mmHg (02/22 1021) Pulse Rate: 58 (02/22 0546)  Labs:  Recent Labs  12/16/12 0445  12/16/12 0620 12/16/12 1147 12/17/12 0425 12/18/12 0605  HGB  --   < > 12.6  --  12.7 12.9  HCT  --   --  38.8  --  37.2 38.8  PLT  --   --  285  --  300 239  LABPROT 19.6*  --   --   --  26.4* 23.0*  INR 1.72*  --   --   --  2.58* 2.14*  HEPARINUNFRC 0.26*  --   --  0.43 0.33 0.36  < > = values in this interval not displayed.  Estimated Creatinine Clearance: 79.6 ml/min (by C-G formula based on Cr of 0.9).  Assessment: 57 y.o. F on heparin + warfarin for new extensive bilateral pulmonary emoblus. Today is VTE minimum overlap D#5/5. INR is 2.58 - therapeutic (large jump in past 24 hours) Heparin level (0.33) is also therapeutic. No bleeding reported. CBC stable.   Goal of Therapy:  INR 2-3 Heparin level 0.3-0.7 units/ml Monitor platelets by anticoagulation protocol: Yes   Plan:  1. Continue IV heparin at 1250  units/hr. Spoke with Dr. Maple Hudson who states that CCM extender may d/c pt home today but if not, would like to continue heparin until d/c (which will be tomorrow at the latest) 2. Coumadin 7.5mg  po today. If pt d/c today, suggest give 5mg  tablets - take 1.5 tablets (7.5mg ) daily with recheck INR in 2-3 days 3. Daily CBC, heparin level, PT / INR  Christoper Fabian, PharmD, BCPS Clinical pharmacist, pager [phone] 12/18/2012 12:26 PM

## 2012-12-18 NOTE — Progress Notes (Signed)
Pt discharged per MD order and protocol. All discharge instructions reviewed with patient and all questions answered. Pt given Rx'es. Pt aware of all follow up appointments.

## 2012-12-18 NOTE — Discharge Summary (Signed)
Physician Discharge Summary     Patient ID: Susan Howell MRN: [id number] DOB/AGE: [birth year] 57 y.o.  Admit date: 12/12/2012 Discharge date: 12/18/2012  Admission Diagnoses: Bilateral pulmonary embolism  Discharge Diagnoses:  Principal Problem:   Pulmonary embolism Active Problems:   Sinoatrial node dysfunction   Obesity   Sedentary lifestyle   Hypertension   Hx pulmonary embolism, 1979   Significant Hospital tests/ studies/ interventions and procedures  Studies/Events:  2/16 CTA chest: Positive study for pulmonary embolus with pulmonary emboli to all lobes  Heparin gtt started  2/17 Warfarin started  2/17 Echo:- LVEF 65% to 70%. Grade 2 diastolic dysfunction. PA peak pressure: 38mm Hg (S). 2/17 LE duplex: prelim report > no DVT seen  12/17/12- HR to 36 despite pacemaker. 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 57 year old female presented with a 2 day history of low-grade fever pleuritic type chest pain and nonproductive cough with progressive shortness of breath. Diagnostic evaluation demonstrated bilateral pulmonary emboli to all lobes with right heart strain. For this she was admitted for further evaluation and therapy by the pulmonary critical care team.Therapeutic interventions included systemic anticoagulation initially with heparin, then bridged to Coumadin and supportive care with oxygen. At time of discharge her INR is therapeutic, and she has been weaned off from oxygen.  Plan: -Continue systemic anticoagulation with Coumadin, There is a high likelihood she'll require this life long as this is her second pulmonary embolus. -provided her with [facility] and instructed her to be seen in the coumadin clinic on 2/24. She has agreed to call first thing in am.  -Have instructed her to f/u w/ PCP in 7-10 days -Have offered she can  f/u with [place] on a PRN basis.   Bradycardia Pace maker adjusted by cardiology Instructed to keep her next scheduled EP visit   Hypertension Meds adjusted BP better controlled Plan: Home on her regular rx   Discharge Exam: BP 122/59  Pulse 58  Temp(Src) 98.3 F (36.8 C) (Oral)  Resp 18  Ht 5' (1.524 m)  Wt 114.6 kg (252 lb 10.4 oz)  BMI 49.34 kg/m2  SpO2 95% Gen: Obese, NAD, conversational on phone.  HEENT: WNL  Neck: no JVD seen  Chest: clear  Cardiac: RRR s M  HYQ:MVHQ, NT, NABS  Ext: no pitting edema Labs at discharge Lab Results  Component Value Date   CREATININE 0.90 12/12/2012   BUN 10 12/12/2012   NA 143 12/12/2012   K 3.8 12/12/2012   CL 104 12/12/2012   CO2 27 03/20/2011   Lab Results  Component Value Date   WBC 7.7 12/18/2012   HGB 12.9 12/18/2012   HCT 38.8 12/18/2012   MCV 97.2 12/18/2012   PLT 239 12/18/2012   Lab Results  Component Value Date   ALT 15 01/29/2011   AST 29 01/29/2011   ALKPHOS 51 01/29/2011   BILITOT 1.1 01/29/2011   Lab Results  Component Value Date   INR 2.14* 12/18/2012   INR 2.58* 12/17/2012   INR 1.72* 12/16/2012    Current radiology studies No results found.  Disposition:  01-Home or Self Care      Discharge Orders   Future Orders Complete By Expires     (HEART FAILURE PATIENTS) Call MD:  Anytime you have any of the following symptoms: 1) 3 pound weight gain in 24 hours or 5 pounds in 1 week 2) shortness  of breath, with or without a dry hacking cough 3) swelling in the hands, feet or stomach 4) if you have to sleep on extra pillows at night in order to breathe.  As directed     Call MD for:  difficulty breathing, headache or visual disturbances  As directed     Call MD for:  severe uncontrolled pain  As directed     Call MD for:  temperature >100.4  As directed     Diet - low sodium heart healthy  As directed     Increase activity slowly  As directed         Medication List    TAKE these medications       clonazePAM 1 MG  tablet  Commonly known as:  KLONOPIN  Take 1 tablet (1 mg total) by mouth 2 (two) times daily.     lisinopril 10 MG tablet  Commonly known as:  PRINIVIL,ZESTRIL  Take 5 mg by mouth every morning.     metoprolol 50 MG tablet  Commonly known as:  LOPRESSOR  Take 50 mg by mouth 2 (two) times daily.     morphine 15 MG 12 hr tablet  Commonly known as:  MS CONTIN  Take 15 mg by mouth 3 (three) times daily.     nitrofurantoin 100 MG capsule  Commonly known as:  MACRODANTIN  Take 100 mg by mouth daily.     oxyCODONE-acetaminophen 10-650 MG per tablet  Commonly known as:  PERCOCET  Take 1 tablet by mouth every 6 (six) hours as needed for pain.     traZODone 150 MG tablet  Commonly known as:  DESYREL  Take 150 mg by mouth at bedtime.     vitamin B-12 1000 MCG tablet  Commonly known as:  CYANOCOBALAMIN  Take 1,000 mcg by mouth daily.     warfarin 5 MG tablet  Commonly known as:  COUMADIN  Take 1 1/2 tab daily (7.5mg ); until instructed otherwise at coumadin clinic       Follow-up Information   Follow up with [facility] [place]. Schedule an appointment as soon as possible for a visit on 12/20/2012. (you need to be seen on 24th for coumadin check)    Contact information:   [address] [place] [address] [phone]      Follow up with see your PCP. (see your primary care provider 7-10d need xray)       Schedule an appointment as soon as possible for a visit with [facility]. (As needed)    Contact information:   [address] [place] [address] [phone]      Discharged Condition: good  Physician Statement:   The Patient was personally examined, the discharge assessment and plan has been personally reviewed and I agree with ACNP Kierstin January's assessment and plan. > 30 minutes of time have been dedicated to discharge assessment, planning and discharge instructions.   SignedAnders Simmonds 12/18/2012, 2:22 PM

## 2012-12-18 NOTE — Progress Notes (Signed)
EP is asked to assess pacemaker function  On telemetry, she has atrial pacing with intermittent wenckebach block.  Device interrogation is reviewed in detail and is normal.  See paper chart.  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  Batter voltage 2.8V Programmed MVP 50 bpm.   Atrial lead   Ventricular lead Sensing   2.82mV     11.2 Impedance 702   683 Threshold  1.0@0 .  0.5V@1 .  Intrinsic PR is .  She Wenckebachs at 80 bpm I have repogrammed her DDD with 200/180 (paced/ sensed AV delay) and search AV delay on. This should hopefully minimize ventricular pacing at the same time preventing wenckebach.   Atrial pacing output is changed to 2.5V (from3) at 0.1msec to promote battery longevity.  She should keep her regular follow-up in the device clinic. No further inpatient EP needs

## 2012-12-20 ENCOUNTER — Telehealth: Payer: Self-pay

## 2012-12-20 ENCOUNTER — Ambulatory Visit (INDEPENDENT_AMBULATORY_CARE_PROVIDER_SITE_OTHER): Payer: Medicaid Other

## 2012-12-20 DIAGNOSIS — Z7901 Long term (current) use of anticoagulants: Secondary | ICD-10-CM | POA: Insufficient documentation

## 2012-12-20 DIAGNOSIS — I2699 Other pulmonary embolism without acute cor pulmonale: Secondary | ICD-10-CM

## 2012-12-20 NOTE — Patient Instructions (Signed)

## 2012-12-20 NOTE — Telephone Encounter (Signed)
Per D/C Summary from [place]: Plan:  -Continue systemic anticoagulation with Coumadin, There is a high likelihood she'll require this life long as this is her second pulmonary embolus.  -provided her with [place] [facility] care and instructed her to be seen in the coumadin clinic on 2/24. She has agreed to call first thing in am.  -Have instructed her to f/u w/ PCP in 7-10 days  -Have offered she can f/u with [place] on a PRN basis.    Spoke with patient-states she called Cardiology in [place] and they told her that she would need to come to pulmonary for her checks. I called coumadin clinic at [facility] and spoke with [facility] in coumadin clinic. She states that the [facility] coumadin clinic is open only on Wednesdays; pt can go to [facility] office today and have levels checked then they will help patient get with [place] office to establish checks there from here on out.   Pt is aware and will go by the [place] office today.

## 2012-12-21 ENCOUNTER — Encounter: Payer: Self-pay | Admitting: *Deleted

## 2012-12-24 ENCOUNTER — Ambulatory Visit (INDEPENDENT_AMBULATORY_CARE_PROVIDER_SITE_OTHER): Payer: Medicaid Other | Admitting: *Deleted

## 2012-12-24 DIAGNOSIS — Z7901 Long term (current) use of anticoagulants: Secondary | ICD-10-CM

## 2012-12-24 DIAGNOSIS — I2699 Other pulmonary embolism without acute cor pulmonale: Secondary | ICD-10-CM

## 2012-12-28 ENCOUNTER — Encounter: Payer: Self-pay | Admitting: Internal Medicine

## 2012-12-29 ENCOUNTER — Emergency Department (HOSPITAL_COMMUNITY)
Admission: EM | Admit: 2012-12-29 | Discharge: 2012-12-30 | Disposition: A | Discharge status: Home / Self Care | Payer: Medicaid Other | Attending: Emergency Medicine | Admitting: Emergency Medicine

## 2012-12-29 ENCOUNTER — Ambulatory Visit (INDEPENDENT_AMBULATORY_CARE_PROVIDER_SITE_OTHER): Payer: Medicaid Other

## 2012-12-29 ENCOUNTER — Encounter (HOSPITAL_COMMUNITY): Payer: Self-pay | Admitting: *Deleted

## 2012-12-29 ENCOUNTER — Emergency Department (HOSPITAL_COMMUNITY): Payer: Medicaid Other

## 2012-12-29 DIAGNOSIS — F411 Generalized anxiety disorder: Secondary | ICD-10-CM | POA: Insufficient documentation

## 2012-12-29 DIAGNOSIS — Z8659 Personal history of other mental and behavioral disorders: Secondary | ICD-10-CM | POA: Insufficient documentation

## 2012-12-29 DIAGNOSIS — I2699 Other pulmonary embolism without acute cor pulmonale: Secondary | ICD-10-CM

## 2012-12-29 DIAGNOSIS — Z86711 Personal history of pulmonary embolism: Secondary | ICD-10-CM | POA: Insufficient documentation

## 2012-12-29 DIAGNOSIS — Z87440 Personal history of urinary (tract) infections: Secondary | ICD-10-CM | POA: Insufficient documentation

## 2012-12-29 DIAGNOSIS — R0989 Other specified symptoms and signs involving the circulatory and respiratory systems: Secondary | ICD-10-CM | POA: Insufficient documentation

## 2012-12-29 DIAGNOSIS — Z8709 Personal history of other diseases of the respiratory system: Secondary | ICD-10-CM | POA: Insufficient documentation

## 2012-12-29 DIAGNOSIS — Z79899 Other long term (current) drug therapy: Secondary | ICD-10-CM | POA: Insufficient documentation

## 2012-12-29 DIAGNOSIS — Z8679 Personal history of other diseases of the circulatory system: Secondary | ICD-10-CM | POA: Insufficient documentation

## 2012-12-29 DIAGNOSIS — E669 Obesity, unspecified: Secondary | ICD-10-CM | POA: Insufficient documentation

## 2012-12-29 DIAGNOSIS — Z7901 Long term (current) use of anticoagulants: Secondary | ICD-10-CM | POA: Insufficient documentation

## 2012-12-29 DIAGNOSIS — R0609 Other forms of dyspnea: Secondary | ICD-10-CM | POA: Insufficient documentation

## 2012-12-29 DIAGNOSIS — R0602 Shortness of breath: Secondary | ICD-10-CM | POA: Insufficient documentation

## 2012-12-29 DIAGNOSIS — I10 Essential (primary) hypertension: Secondary | ICD-10-CM | POA: Insufficient documentation

## 2012-12-29 DIAGNOSIS — R0600 Dyspnea, unspecified: Secondary | ICD-10-CM

## 2012-12-29 LAB — COMPREHENSIVE METABOLIC PANEL
Alkaline Phosphatase: 60 U/L (ref 39–117)
BUN: 18 mg/dL (ref 6–23)
Chloride: 102 mEq/L (ref 96–112)
GFR calc Af Amer: >90 mL/min (ref >90)
Glucose, Bld: 95 mg/dL (ref 70–99)
Potassium: 5 mEq/L (ref 3.5–5.1)
Total Bilirubin: 0.3 mg/dL (ref 0.3–1.2)

## 2012-12-29 LAB — CBC WITH DIFFERENTIAL/PLATELET
Hemoglobin: 13.6 g/dL (ref 12.0–15.0)
Lymphocytes Relative: 52 % — ABNORMAL HIGH (ref 12–46)
Lymphs Abs: 3.8 10*3/uL (ref 0.7–4.0)
Monocytes Relative: 8 % (ref 3–12)
Neutro Abs: 2.9 10*3/uL (ref 1.7–7.7)
Neutrophils Relative %: 39 % — ABNORMAL LOW (ref 43–77)
RBC: 4.12 MIL/uL (ref 3.87–5.11)

## 2012-12-29 LAB — PROTIME-INR: Prothrombin Time: 28.6 seconds — ABNORMAL HIGH (ref 11.6–15.2)

## 2012-12-29 LAB — POCT INR: INR: 4.2

## 2012-12-29 LAB — TROPONIN I: Troponin I: <0.3 ng/mL (ref <0.30)

## 2012-12-29 MED ORDER — HYDROMORPHONE HCL PF 1 MG/ML IJ SOLN
1.0000 mg | Freq: Once | INTRAMUSCULAR | Status: AC
  Administered 2012-12-29: 1 mg via INTRAMUSCULAR
  Filled 2012-12-29: qty 1

## 2012-12-29 NOTE — ED Notes (Addendum)
Pt. Ambulated well in hall. Said she was "a little light headed" while ambulating.

## 2012-12-29 NOTE — ED Notes (Signed)
Patient has a Research officer, political party.

## 2012-12-29 NOTE — ED Provider Notes (Signed)
History     CSN: [id number]  Arrival date & time 12/29/12  6295   First MD Initiated Contact with Patient 12/29/12 1958      Chief Complaint  Patient presents with  . Chest Pain    Patient is a 57 y.o. female presenting with shortness of breath. The history is provided by the patient.  Shortness of Breath Severity:  Moderate Onset quality:  Gradual Duration:  4 days Timing:  Intermittent Progression:  Worsening Chronicity:  Recurrent Context: activity   Relieved by:  Rest Worsened by:  Exertion Associated symptoms: chest pain   pt presents with progressive dyspnea on exertion for past 4 days.  She reports this is new for her.  She also reports chronic right sided CP that is not new.  She also reports dizziness at times.  No syncope.  No abd pain.  No vomiting/diarrhea.  Past Medical History  Diagnosis Date  . Chest pain   . Anxiety   . Depression   . UTI (urinary tract infection)   . Obesity   . Sinus node dysfunction      status post placement of    Medtronic pacemaker by Dr. Lewayne Bunting in 2009.   Marland Kitchen HTN (hypertension)   . Obesity   . Cystitis   . Palpitations   . Lung nodules   . History of urinary tract infection   . Pulmonary embolism     Complicating gastric bypass  . Sinoatrial node dysfunction     Past Surgical History  Procedure Laterality Date  . Pacemaker placement    . Appendectomy    . Cholecystectomy    . 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 Gastric bypass      No family history on file.  History  Substance Use Topics  . Smoking status: Never Smoker   . Smokeless tobacco: Never Used  . Alcohol Use: No    OB History   Grav Para Term Preterm Abortions TAB SAB Ect Mult Living                  Review of Systems  Respiratory: Positive for shortness of breath.   Cardiovascular: Positive for chest pain.  All other systems reviewed and are negative.    Allergies  Codeine;  Aliskiren fumarate; Eszopiclone; Iodine; Iohexol; Labetalol hcl; Penicillins; Tetanus toxoids; Topamax; and Vancomycin  Home Medications   Current Outpatient Rx  Name  Route  Sig  Dispense  Refill  . clonazePAM (KLONOPIN) 1 MG tablet   Oral   Take 1 tablet (1 mg total) by mouth 2 (two) times daily.   60 tablet   0   . lisinopril (PRINIVIL,ZESTRIL) 10 MG tablet   Oral   Take 5 mg by mouth every morning.          . metoprolol (LOPRESSOR) 50 MG tablet   Oral   Take 50 mg by mouth 2 (two) times daily.          Marland Kitchen morphine (MS CONTIN) 15 MG 12 hr tablet   Oral   Take 15 mg by mouth 3 (three) times daily.          . nitrofurantoin (MACRODANTIN) 100 MG capsule   Oral   Take 100 mg by mouth daily.         Marland Kitchen oxyCODONE-acetaminophen (PERCOCET) 10-650 MG per tablet   Oral   Take 1 tablet by mouth every 6 (six) hours  as needed for pain.   30 tablet   0   . promethazine (PHENERGAN) 25 MG tablet   Oral   Take 25 mg by mouth every 6 (six) hours as needed for nausea. For nausea and vomiting         . traZODone (DESYREL) 150 MG tablet   Oral   Take 150 mg by mouth at bedtime.         . vitamin B-12 (CYANOCOBALAMIN) 1000 MCG tablet   Oral   Take 1,000 mcg by mouth daily.           Marland Kitchen warfarin (COUMADIN) 5 MG tablet      Take 1 1/2 tab daily (7.5mg ); until instructed otherwise at coumadin clinic   60 tablet   6     BP 133/85  Pulse 58  Temp(Src) 98.2 F (36.8 C) (Oral)  Resp 16  SpO2 97%  Physical Exam CONSTITUTIONAL: Well developed/well nourished HEAD: Normocephalic/atraumatic EYES: EOMI/PERRL ENMT: Mucous membranes moist NECK: supple no meningeal signs SPINE:entire spine nontender CV: S1/S2 noted, no murmurs/rubs/gallops noted LUNGS: Lungs are clear to auscultation bilaterally, no apparent distress ABDOMEN: soft, nontender, no rebound or guarding GU:no cva tenderness NEURO: Pt is awake/alert, moves all extremitiesx4 EXTREMITIES: pulses normal, full  ROM SKIN: warm, color normal PSYCH: no abnormalities of mood noted  ED Course  Procedures   Labs Reviewed  CBC WITH DIFFERENTIAL - Abnormal; Notable for the following:    Neutrophils Relative 39 (*)    Lymphocytes Relative 52 (*)    All other components within normal limits  COMPREHENSIVE METABOLIC PANEL - Abnormal; Notable for the following:    Albumin 3.2 (*)    All other components within normal limits  PROTIME-INR - Abnormal; Notable for the following:    Prothrombin Time 28.6 (*)    INR 2.87 (*)    All other components within normal limits  TROPONIN I   10:01 PM Pt with recent h/o PE, on coumadin.  Reports her SOB symptoms are new for her and were not present after discharge home.  She reports she saw her PCP today and was instructed to come to the ED 12:13 AM Pt ambulatory here in the ED and no hypoxia (pulse ox dropped to 95%) and no significant tachycardia.  No hypotension.  I doubt ACS as pt report her right side of her chest has been painful since her last admission She was most concerned about her progressive dyspnea on exertion No signs of acute CHF She just was found to have PE last month and is on coumadin since last month.  However clinically she does not appear to have acute worsening  PE.  Feel she is safe for d/c and followup as outpatient.    MDM  Nursing notes including past medical history and social history reviewed and considered in documentation xrays reviewed and considered Labs/vital reviewed and considered Previous records reviewed and considered - recent admission for PE        Date: 12/29/2012 1820  Rate: 59  Rhythm: indeterminate  QRS Axis: left  Intervals: normal  ST/T Wave abnormalities: nonspecific ST changes  Conduction Disutrbances:ventricular paced rhythm  Narrative Interpretation:   Old EKG Reviewed: changes noted and previous EKG did not show paced rhythm   Date: 12/30/2012 0026  Rate: 52  Rhythm: indeterminate  QRS Axis:  left  Intervals: normal  ST/T Wave abnormalities: nonspecific ST changes  Conduction Disutrbances:ventricular paced rhythm  Narrative Interpretation:   Old EKG Reviewed: unchanged  Joya Gaskins, MD 12/30/12 [phone]

## 2012-12-29 NOTE — ED Notes (Signed)
The pt is c/o rt sided  Chest pain since feb 16th when she was hospital;ized for 1 pe  C/o dizziness also

## 2012-12-29 NOTE — ED Notes (Signed)
Patient states she has right sided sharp pain that she feels like is her lung and it increases pain with inspiration. She has been having increasing shortness of breath with exertion over the past 4-5 days.

## 2012-12-30 MED ORDER — HYDROMORPHONE HCL PF 1 MG/ML IJ SOLN
1.0000 mg | Freq: Once | INTRAMUSCULAR | Status: AC
  Administered 2012-12-30: 1 mg via INTRAMUSCULAR
  Filled 2012-12-30: qty 1

## 2012-12-30 NOTE — ED Notes (Signed)
Rx x 0.  Pt voiced understanding to f/u with PCP and return for worsening condition.  

## 2013-01-05 ENCOUNTER — Ambulatory Visit (INDEPENDENT_AMBULATORY_CARE_PROVIDER_SITE_OTHER): Payer: Medicaid Other

## 2013-01-05 DIAGNOSIS — Z7901 Long term (current) use of anticoagulants: Secondary | ICD-10-CM

## 2013-01-05 DIAGNOSIS — I2699 Other pulmonary embolism without acute cor pulmonale: Secondary | ICD-10-CM

## 2013-01-05 LAB — POCT INR: INR: 3.1

## 2013-01-11 ENCOUNTER — Ambulatory Visit: Admit: 2013-01-11 | Payer: Medicaid Other | Admitting: Urology

## 2013-01-11 SURGERY — CYSTOSCOPY
Anesthesia: General

## 2013-01-14 ENCOUNTER — Emergency Department (HOSPITAL_COMMUNITY)
Admission: EM | Admit: 2013-01-14 | Discharge: 2013-01-14 | Discharge status: Against Medical Advice | Payer: Medicaid Other | Attending: Emergency Medicine | Admitting: Emergency Medicine

## 2013-01-14 ENCOUNTER — Encounter (HOSPITAL_COMMUNITY): Payer: Self-pay

## 2013-01-14 ENCOUNTER — Emergency Department (HOSPITAL_COMMUNITY): Payer: Medicaid Other

## 2013-01-14 DIAGNOSIS — Z8679 Personal history of other diseases of the circulatory system: Secondary | ICD-10-CM | POA: Insufficient documentation

## 2013-01-14 DIAGNOSIS — Z950 Presence of cardiac pacemaker: Secondary | ICD-10-CM | POA: Insufficient documentation

## 2013-01-14 DIAGNOSIS — Z87440 Personal history of urinary (tract) infections: Secondary | ICD-10-CM | POA: Insufficient documentation

## 2013-01-14 DIAGNOSIS — F3289 Other specified depressive episodes: Secondary | ICD-10-CM | POA: Insufficient documentation

## 2013-01-14 DIAGNOSIS — R0602 Shortness of breath: Secondary | ICD-10-CM | POA: Insufficient documentation

## 2013-01-14 DIAGNOSIS — F411 Generalized anxiety disorder: Secondary | ICD-10-CM | POA: Insufficient documentation

## 2013-01-14 DIAGNOSIS — R0789 Other chest pain: Secondary | ICD-10-CM | POA: Insufficient documentation

## 2013-01-14 DIAGNOSIS — Z8742 Personal history of other diseases of the female genital tract: Secondary | ICD-10-CM | POA: Insufficient documentation

## 2013-01-14 DIAGNOSIS — R5381 Other malaise: Secondary | ICD-10-CM | POA: Insufficient documentation

## 2013-01-14 DIAGNOSIS — G4730 Sleep apnea, unspecified: Secondary | ICD-10-CM | POA: Insufficient documentation

## 2013-01-14 DIAGNOSIS — Z79899 Other long term (current) drug therapy: Secondary | ICD-10-CM | POA: Insufficient documentation

## 2013-01-14 DIAGNOSIS — Z8709 Personal history of other diseases of the respiratory system: Secondary | ICD-10-CM | POA: Insufficient documentation

## 2013-01-14 DIAGNOSIS — Z86711 Personal history of pulmonary embolism: Secondary | ICD-10-CM | POA: Insufficient documentation

## 2013-01-14 DIAGNOSIS — R5383 Other fatigue: Secondary | ICD-10-CM | POA: Insufficient documentation

## 2013-01-14 DIAGNOSIS — Z9981 Dependence on supplemental oxygen: Secondary | ICD-10-CM | POA: Insufficient documentation

## 2013-01-14 DIAGNOSIS — F329 Major depressive disorder, single episode, unspecified: Secondary | ICD-10-CM | POA: Insufficient documentation

## 2013-01-14 DIAGNOSIS — Z7901 Long term (current) use of anticoagulants: Secondary | ICD-10-CM | POA: Insufficient documentation

## 2013-01-14 DIAGNOSIS — E669 Obesity, unspecified: Secondary | ICD-10-CM | POA: Insufficient documentation

## 2013-01-14 LAB — COMPREHENSIVE METABOLIC PANEL
ALT: 14 U/L (ref 0–35)
AST: 20 U/L (ref 0–37)
Albumin: 3.2 g/dL — ABNORMAL LOW (ref 3.5–5.2)
Alkaline Phosphatase: 66 U/L (ref 39–117)
BUN: 13 mg/dL (ref 6–23)
Chloride: 105 mEq/L (ref 96–112)
Potassium: 3.9 mEq/L (ref 3.5–5.1)
Sodium: 143 mEq/L (ref 135–145)
Total Bilirubin: 0.4 mg/dL (ref 0.3–1.2)

## 2013-01-14 LAB — CBC WITH DIFFERENTIAL/PLATELET
Basophils Absolute: 0 10*3/uL (ref 0.0–0.1)
Basophils Relative: 0 % (ref 0–1)
Hemoglobin: 13.7 g/dL (ref 12.0–15.0)
MCHC: 33.3 g/dL (ref 30.0–36.0)
Monocytes Relative: 10 % (ref 3–12)
Neutro Abs: 5.3 10*3/uL (ref 1.7–7.7)
Neutrophils Relative %: 58 % (ref 43–77)

## 2013-01-14 LAB — POCT I-STAT TROPONIN I

## 2013-01-14 MED ORDER — ONDANSETRON HCL 4 MG/2ML IJ SOLN
4.0000 mg | Freq: Once | INTRAMUSCULAR | Status: AC
  Administered 2013-01-14: 4 mg via INTRAVENOUS
  Filled 2013-01-14: qty 2

## 2013-01-14 MED ORDER — DIPHENHYDRAMINE HCL 50 MG/ML IJ SOLN
25.0000 mg | Freq: Once | INTRAMUSCULAR | Status: AC
  Administered 2013-01-14: 25 mg via INTRAVENOUS
  Filled 2013-01-14: qty 1

## 2013-01-14 MED ORDER — PREDNISONE 20 MG PO TABS
60.0000 mg | ORAL_TABLET | Freq: Once | ORAL | Status: AC
  Administered 2013-01-14: 60 mg via ORAL
  Filled 2013-01-14: qty 3

## 2013-01-14 MED ORDER — MORPHINE SULFATE 4 MG/ML IJ SOLN
4.0000 mg | Freq: Once | INTRAMUSCULAR | Status: AC
  Administered 2013-01-14: 4 mg via INTRAVENOUS
  Filled 2013-01-14: qty 1

## 2013-01-14 MED ORDER — IOHEXOL 350 MG/ML SOLN
60.0000 mL | Freq: Once | INTRAVENOUS | Status: AC | PRN
  Administered 2013-01-14: 60 mL via INTRAVENOUS

## 2013-01-14 NOTE — ED Notes (Signed)
Pt requesting results. Please  Call [phone]

## 2013-01-14 NOTE — ED Provider Notes (Signed)
Susan Howell is a 57 y.o. female who complains of chest pressure, weakness, and shortness of breath, subacute, worsening with exertion for several weeks. She is being followed for weakness, sleep apnea, problems, hypoxia, and be treated for PE. She does not have a history of asthma, or bronchitis. She was recently started on oxygen. On her recent admission; she had a cardiac echo that showed a normal ejection fraction. She does not know why she has a pacemaker.  Exam obese, calm, cooperative Heart regular rate and rhythm. No murmur. Lungs clear to auscultation. No wheezes, Rales, rhonchi. Extremities no pedal edema. Neurologic, nonfocal.  Assessment: nonspecific dyspnea- most likely it is related to her sleep apnea. There is a small chance that she has exacerbation of her pulmonary emboli. Her INR is currently supratherapeutic she'll be evaluated in the ED, prior to discharge. She will likely need to followup with her PCP and possibly with a pulmonologist in the next week.   Medical screening examination/treatment/procedure(s) were conducted as a shared visit with non-physician practitioner(s) and myself.  I personally evaluated the patient during the encounter   Flint Melter, MD 01/17/13 2009

## 2013-01-14 NOTE — ED Notes (Signed)
Pt states that she has been having increased weakness, headaches and sob  For about 1-2 week worstening.  Central chest- radiates through her lower rt rear rib cage. Worst with deep inspiration and walking

## 2013-01-14 NOTE — ED Provider Notes (Signed)
History     CSN: [id number]  Arrival date & time 01/14/13  1138   First MD Initiated Contact with Patient 01/14/13 1304      No chief complaint on file.   (Consider location/radiation/quality/duration/timing/severity/associated sxs/prior treatment) HPI Patient presents emergency department with complaints of continued chest discomfort and shortness of breath.  Patient, states, that she is worried about the symptoms.  That have persisted over the last month.  Patient denies abdominal pain, nausea, vomiting, diarrhea, fever, cough, back pain, visual changes, dizziness, or syncope.  Patient, states she's also had weakness, as well.  She states when she is on her oxygen she does not have shortness of breath. The patient is worried about PE. Past Medical History  Diagnosis Date  . Chest pain   . Anxiety   . Depression   . UTI (urinary tract infection)   . Obesity   . Sinus node dysfunction      status post placement of    Medtronic pacemaker by Dr. Lewayne Bunting in 2009.   Marland Kitchen HTN (hypertension)   . Obesity   . Cystitis   . Palpitations   . Lung nodules   . History of urinary tract infection   . Pulmonary embolism     Complicating gastric bypass  . Sinoatrial node dysfunction     Past Surgical History  Procedure Laterality Date  . Pacemaker placement    . Appendectomy    . Cholecystectomy    . 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 Gastric bypass      History reviewed. No pertinent family history.  History  Substance Use Topics  . Smoking status: Never Smoker   . Smokeless tobacco: Never Used  . Alcohol Use: No    OB History   Grav Para Term Preterm Abortions TAB SAB Ect Mult Living                  Review of Systems All other systems negative except as documented in the HPI. All pertinent positives and negatives as reviewed in the HPI.  Allergies  Codeine; Aliskiren fumarate; Eszopiclone; Iodine; Iohexol;  Labetalol hcl; Penicillins; Tetanus toxoids; Topamax; and Vancomycin  Home Medications   Current Outpatient Rx  Name  Route  Sig  Dispense  Refill  . clonazePAM (KLONOPIN) 1 MG tablet   Oral   Take 1 tablet (1 mg total) by mouth 2 (two) times daily.   60 tablet   0   . cyanocobalamin 1000 MCG tablet   Oral   Take 100 mcg by mouth daily.         Marland Kitchen lisinopril (PRINIVIL,ZESTRIL) 10 MG tablet   Oral   Take 5 mg by mouth every morning.          . metoprolol (LOPRESSOR) 50 MG tablet   Oral   Take 50 mg by mouth 2 (two) times daily.          Marland Kitchen morphine (MS CONTIN) 15 MG 12 hr tablet   Oral   Take 15 mg by mouth 3 (three) times daily.          . nitrofurantoin (MACRODANTIN) 100 MG capsule   Oral   Take 100 mg by mouth daily. To keep from having UTI         . nystatin (MYCOSTATIN) 100000 UNIT/ML suspension   Oral   Take 1,000,000 Units by mouth 4 (four) times daily. Swish and swallow.         Marland Kitchen  oxyCODONE-acetaminophen (PERCOCET) 10-650 MG per tablet   Oral   Take 1 tablet by mouth every 6 (six) hours as needed for pain.   30 tablet   0   . traZODone (DESYREL) 150 MG tablet   Oral   Take 150 mg by mouth at bedtime.         Marland Kitchen warfarin (COUMADIN) 5 MG tablet   Oral   Take 5 mg by mouth daily.         . promethazine (PHENERGAN) 25 MG tablet   Oral   Take 25 mg by mouth every 6 (six) hours as needed for nausea. For nausea and vomiting           BP 107/58  Pulse 57  Resp 15  SpO2 99%  Physical Exam  Nursing note and vitals reviewed. Constitutional: She is oriented to person, place, and time. She appears well-developed and well-nourished. No distress.  HENT:  Head: Normocephalic and atraumatic.  Mouth/Throat: Oropharynx is clear and moist.  Eyes: Pupils are equal, round, and reactive to light.  Neck: Normal range of motion. Neck supple.  Cardiovascular: Normal rate, regular rhythm and normal heart sounds.  Exam reveals no gallop and no friction  rub.   No murmur heard. Pulmonary/Chest: Effort normal and breath sounds normal. No respiratory distress.  Abdominal: Soft. Bowel sounds are normal. She exhibits no distension. There is no tenderness. There is no rebound.  Neurological: She is alert and oriented to person, place, and time.  Skin: Skin is warm and dry. No rash noted.    ED Course  Procedures (including critical care time)  Labs Reviewed  COMPREHENSIVE METABOLIC PANEL - Abnormal; Notable for the following:    Glucose, Bld 113 (*)    Albumin 3.2 (*)    All other components within normal limits  PROTIME-INR - Abnormal; Notable for the following:    Prothrombin Time 33.2 (*)    INR 3.51 (*)    All other components within normal limits  APTT - Abnormal; Notable for the following:    aPTT 46 (*)    All other components within normal limits  CBC WITH DIFFERENTIAL  POCT I-STAT TROPONIN I    Patient wants to leave before her results are back on the CT scan. i advised her that she will have to leave AMA. She wants to make it to her sleep study. Patient has been stable here in the ER.  MDM  MDM Reviewed: nursing note and vitals Interpretation: labs and CT scan           Carlyle Dolly, PA-C 01/16/13 1020

## 2013-01-14 NOTE — ED Notes (Signed)
Pt anxious to leave to make sleep study. Pt wanting to leave after CT Angio Chest and be called with the results. Pt made aware that she will have to leave AMA. Explained to pt what AMA means. Pt understands. PA Lawyer made aware.

## 2013-01-14 NOTE — ED Notes (Signed)
Pt presents with onset of palpitations, and increased shortness of breath since discharge from here x 1 week ago.  Pt reports diagnosed with bilateral PEs, was admitted here in February.  Pt reports swelling to ankles.  Pt reports mid-sternal chest pain that is intermittent and radiates into R side of chest.  +pain with inspiration.

## 2013-01-15 ENCOUNTER — Telehealth (HOSPITAL_COMMUNITY): Payer: Self-pay | Admitting: *Deleted

## 2013-01-15 DIAGNOSIS — R0602 Shortness of breath: Secondary | ICD-10-CM

## 2013-01-15 NOTE — ED Notes (Signed)
Called and left message for pt to call back re: CTA results.

## 2013-01-15 NOTE — ED Provider Notes (Signed)
Assumed care of the patient from PA Lawyer. Previous dx PE.  C/o increased dyspnea. CTA pending. Patient is leaving ama to attned sleep study Date: 01/15/2013 Patient: Susan Howell Admitted: 01/14/2013 12:26 PM Attending Provider: No att. providers found  Lucky Rathke or her authorized caregiver has made the decision for the patient to leave the emergency department against the advice of Arthor Captain, PA-C. She or her authorized caregiver has been informed and understands the inherent risks, including death.  She or her authorized caregiver has decided to accept the responsibility for this decision. Lucky Rathke and all necessary parties have been advised that she may return for further evaluation or treatment. Her condition at time of discharge was Good.  Lucky Rathke had current vital signs as follows:  Blood pressure 114/39, pulse 56, resp. rate 19, SpO2 99.00%.      Paitients cta shows lesionoin the liver which warrants repeat CT.  Resolving PEs in lungs.  Flow manager will call in the AM with results/reccomendations. Arthor Captain 01/15/2013      Arthor Captain, PA-C 01/15/13 [phone]

## 2013-01-16 ENCOUNTER — Telehealth (HOSPITAL_COMMUNITY): Payer: Self-pay | Admitting: Emergency Medicine

## 2013-01-16 NOTE — ED Notes (Signed)
Pt given results of CTA and sts she will follow up with PCP.

## 2013-01-16 NOTE — ED Notes (Signed)
Checking chart for correct phone number

## 2013-01-17 ENCOUNTER — Ambulatory Visit (INDEPENDENT_AMBULATORY_CARE_PROVIDER_SITE_OTHER): Payer: Medicaid Other | Admitting: *Deleted

## 2013-01-17 ENCOUNTER — Ambulatory Visit (INDEPENDENT_AMBULATORY_CARE_PROVIDER_SITE_OTHER): Payer: Medicaid Other | Admitting: Internal Medicine

## 2013-01-17 ENCOUNTER — Encounter: Payer: Self-pay | Admitting: Internal Medicine

## 2013-01-17 VITALS — BP 148/94 | HR 56 | Ht 60.0 in | Wt 255.0 lb

## 2013-01-17 DIAGNOSIS — Z7189 Other specified counseling: Secondary | ICD-10-CM

## 2013-01-17 DIAGNOSIS — I2699 Other pulmonary embolism without acute cor pulmonale: Secondary | ICD-10-CM

## 2013-01-17 DIAGNOSIS — Z950 Presence of cardiac pacemaker: Secondary | ICD-10-CM

## 2013-01-17 DIAGNOSIS — I509 Heart failure, unspecified: Secondary | ICD-10-CM

## 2013-01-17 DIAGNOSIS — Z7689 Persons encountering health services in other specified circumstances: Secondary | ICD-10-CM

## 2013-01-17 DIAGNOSIS — I495 Sick sinus syndrome: Secondary | ICD-10-CM

## 2013-01-17 DIAGNOSIS — Z7901 Long term (current) use of anticoagulants: Secondary | ICD-10-CM

## 2013-01-17 LAB — PACEMAKER DEVICE OBSERVATION
AL IMPEDENCE PM: 700 Ohm
AL THRESHOLD: 1 V
ATRIAL PACING PM: 13
VENTRICULAR PACING PM: 82.1

## 2013-01-17 LAB — POCT INR: INR: 3.4

## 2013-01-17 NOTE — Patient Instructions (Addendum)
Your physician has requested that you have an echocardiogram. Echocardiography is a painless test that uses sound waves to create images of your heart. It provides your doctor with information about the size and shape of your heart and how well your heart's chambers and valves are working. This procedure takes approximately one hour. There are no restrictions for this procedure.  You have been referred to Dr Kathrin Penner in [place]

## 2013-01-17 NOTE — Assessment & Plan Note (Signed)
The patient's device was interrogated and the information was fully reviewed.  The device was reprogrammed As above  

## 2013-01-17 NOTE — Assessment & Plan Note (Signed)
Nocturnal waking block in the context of sleep apnea; the device was reprogrammed to DDD; I reprogrammed to A-D.

## 2013-01-17 NOTE — Assessment & Plan Note (Signed)
Currently anticoagulated. We will get an echo to look at right ventricular function and will ask for pulmonary consultation. Is not clear to me what long-term coagulation recommendations should be

## 2013-01-17 NOTE — Progress Notes (Signed)
Patient Care Team: Provider Not In System as PCP - General   HPI  Susan Howell is a 57 y.o. female Seen in followup for a pacemaker inserted for sinus node dysfunction.    She has a history of hypertension.  She is doing relatively well despite her myriad Problems.      She was recently hospitalized for coronary embolism. While she was there she had waking block heart block in the context of sleeping and atrial pacing and her device was reprogrammed to DDD mode  Hospital records reviewed   Past Medical History  Diagnosis Date  . Chest pain   . Anxiety   . Depression   . UTI (urinary tract infection)   . Obesity   . Sinus node dysfunction      status post placement of    Medtronic pacemaker by Dr. Lewayne Bunting in 2009.   Marland Kitchen HTN (hypertension)   . Obesity   . Cystitis   . Palpitations   . Lung nodules   . History of urinary tract infection   . Pulmonary embolism     Complicating gastric bypass  . Sinoatrial node dysfunction     Past Surgical History  Procedure Laterality Date  . Pacemaker placement    . Appendectomy    . Cholecystectomy    . 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 Gastric bypass      Current Outpatient Prescriptions  Medication Sig Dispense Refill  . clonazePAM (KLONOPIN) 1 MG tablet Take 1 tablet (1 mg total) by mouth 2 (two) times daily.  60 tablet  0  . cyanocobalamin 1000 MCG tablet Take 100 mcg by mouth daily.      Marland Kitchen lisinopril (PRINIVIL,ZESTRIL) 10 MG tablet Take 5 mg by mouth every morning.       . metoprolol (LOPRESSOR) 50 MG tablet Take 50 mg by mouth 2 (two) times daily.       Marland Kitchen morphine (MS CONTIN) 15 MG 12 hr tablet Take 15 mg by mouth 3 (three) times daily.       . nitrofurantoin (MACRODANTIN) 100 MG capsule Take 100 mg by mouth daily. To keep from having UTI      . nystatin (MYCOSTATIN) 100000 UNIT/ML suspension Take 1,000,000 Units by mouth 4 (four) times daily. Swish and  swallow.      . promethazine (PHENERGAN) 25 MG tablet Take 25 mg by mouth every 6 (six) hours as needed for nausea. For nausea and vomiting      . traZODone (DESYREL) 150 MG tablet Take 150 mg by mouth at bedtime.      Marland Kitchen warfarin (COUMADIN) 5 MG tablet Take 5 mg by mouth daily.       No current facility-administered medications for this visit.    Allergies  Allergen Reactions  . Codeine Itching  . Aliskiren Fumarate     REACTION: ANXIETY // SYNCOPE  . Eszopiclone     REACTION: NIGHT MARES  . Iodine Other (See Comments)    seizures  . Iohexol      Desc: NAUSEA,VOMITING & SZ S/P CONTRAST INJ 30 YRS AGO// OK W/ PRE MEDS, BENADRYL & SOLUMEDROL TODAY//A.C., Onset Date: [id number]   . Labetalol Hcl Hypertension  . Penicillins Nausea And Vomiting    Cannot take by mouth  . Tetanus Toxoids Swelling    Fever, flushing and hardening of the injection site  . Topamax Other (See Comments)    Confusion, high  blood pressure.   . Vancomycin Nausea Only    Severe chills    Review of Systems negative except from HPI and PMH  Physical Exam BP 148/94  Pulse 56  Ht 5' (1.524 m)  Wt 255 lb (115.667 kg)  BMI 49.8 kg/m2  SpO2 98% Well developed and well nourished in no acute distress HENT normal E scleral and icterus clear Neck Supple JVP flat; carotids brisk and full Clear to ausculation  Regular rate and rhythm, no murmurs gallops or rub Soft with active bowel sounds No clubbing cyanosistrace  Edema Alert and oriented, grossly normal motor and sensory function Skin Warm and Dry    Assessment and  Plan

## 2013-01-19 ENCOUNTER — Encounter (HOSPITAL_COMMUNITY): Payer: Self-pay

## 2013-01-19 ENCOUNTER — Emergency Department (HOSPITAL_COMMUNITY)
Admission: EM | Admit: 2013-01-19 | Discharge: 2013-01-19 | Disposition: A | Discharge status: Home / Self Care | Payer: Medicaid Other | Attending: Emergency Medicine | Admitting: Emergency Medicine

## 2013-01-19 ENCOUNTER — Emergency Department (HOSPITAL_COMMUNITY): Payer: Medicaid Other

## 2013-01-19 DIAGNOSIS — Z8709 Personal history of other diseases of the respiratory system: Secondary | ICD-10-CM | POA: Insufficient documentation

## 2013-01-19 DIAGNOSIS — R109 Unspecified abdominal pain: Secondary | ICD-10-CM | POA: Insufficient documentation

## 2013-01-19 DIAGNOSIS — F3289 Other specified depressive episodes: Secondary | ICD-10-CM | POA: Insufficient documentation

## 2013-01-19 DIAGNOSIS — I10 Essential (primary) hypertension: Secondary | ICD-10-CM | POA: Insufficient documentation

## 2013-01-19 DIAGNOSIS — Z87448 Personal history of other diseases of urinary system: Secondary | ICD-10-CM | POA: Insufficient documentation

## 2013-01-19 DIAGNOSIS — Z8679 Personal history of other diseases of the circulatory system: Secondary | ICD-10-CM | POA: Insufficient documentation

## 2013-01-19 DIAGNOSIS — R05 Cough: Secondary | ICD-10-CM

## 2013-01-19 DIAGNOSIS — Z86711 Personal history of pulmonary embolism: Secondary | ICD-10-CM | POA: Insufficient documentation

## 2013-01-19 DIAGNOSIS — R51 Headache: Secondary | ICD-10-CM | POA: Insufficient documentation

## 2013-01-19 DIAGNOSIS — Z79899 Other long term (current) drug therapy: Secondary | ICD-10-CM | POA: Insufficient documentation

## 2013-01-19 DIAGNOSIS — E669 Obesity, unspecified: Secondary | ICD-10-CM | POA: Insufficient documentation

## 2013-01-19 DIAGNOSIS — F329 Major depressive disorder, single episode, unspecified: Secondary | ICD-10-CM | POA: Insufficient documentation

## 2013-01-19 DIAGNOSIS — Z7901 Long term (current) use of anticoagulants: Secondary | ICD-10-CM | POA: Insufficient documentation

## 2013-01-19 DIAGNOSIS — R112 Nausea with vomiting, unspecified: Secondary | ICD-10-CM | POA: Insufficient documentation

## 2013-01-19 DIAGNOSIS — R197 Diarrhea, unspecified: Secondary | ICD-10-CM | POA: Insufficient documentation

## 2013-01-19 DIAGNOSIS — IMO0001 Reserved for inherently not codable concepts without codable children: Secondary | ICD-10-CM | POA: Insufficient documentation

## 2013-01-19 DIAGNOSIS — Z9089 Acquired absence of other organs: Secondary | ICD-10-CM | POA: Insufficient documentation

## 2013-01-19 DIAGNOSIS — Z87440 Personal history of urinary (tract) infections: Secondary | ICD-10-CM | POA: Insufficient documentation

## 2013-01-19 DIAGNOSIS — R1110 Vomiting, unspecified: Secondary | ICD-10-CM

## 2013-01-19 DIAGNOSIS — R059 Cough, unspecified: Secondary | ICD-10-CM | POA: Insufficient documentation

## 2013-01-19 DIAGNOSIS — Z950 Presence of cardiac pacemaker: Secondary | ICD-10-CM | POA: Insufficient documentation

## 2013-01-19 DIAGNOSIS — R071 Chest pain on breathing: Secondary | ICD-10-CM | POA: Insufficient documentation

## 2013-01-19 DIAGNOSIS — F411 Generalized anxiety disorder: Secondary | ICD-10-CM | POA: Insufficient documentation

## 2013-01-19 DIAGNOSIS — Z9884 Bariatric surgery status: Secondary | ICD-10-CM | POA: Insufficient documentation

## 2013-01-19 DIAGNOSIS — R509 Fever, unspecified: Secondary | ICD-10-CM | POA: Insufficient documentation

## 2013-01-19 LAB — CBC WITH DIFFERENTIAL/PLATELET
Eosinophils Relative: 1 % (ref 0–5)
Lymphocytes Relative: 22 % (ref 12–46)
Lymphs Abs: 1.1 10*3/uL (ref 0.7–4.0)
MCV: 96.2 fL (ref 78.0–100.0)
Neutrophils Relative %: 60 % (ref 43–77)
Platelets: 295 10*3/uL (ref 150–400)
RBC: 4.21 MIL/uL (ref 3.87–5.11)
WBC: 5 10*3/uL (ref 4.0–10.5)

## 2013-01-19 LAB — URINALYSIS, ROUTINE W REFLEX MICROSCOPIC
Glucose, UA: 100 mg/dL — AB
Leukocytes, UA: NEGATIVE
Specific Gravity, Urine: 1.025 (ref 1.005–1.030)
pH: 6 (ref 5.0–8.0)

## 2013-01-19 LAB — PROTIME-INR
INR: 2.2 — ABNORMAL HIGH (ref 0.00–1.49)
Prothrombin Time: 23.5 seconds — ABNORMAL HIGH (ref 11.6–15.2)

## 2013-01-19 LAB — BASIC METABOLIC PANEL
CO2: 28 mEq/L (ref 19–32)
Calcium: 8.2 mg/dL — ABNORMAL LOW (ref 8.4–10.5)
Sodium: 141 mEq/L (ref 135–145)

## 2013-01-19 MED ORDER — HYDROMORPHONE HCL PF 1 MG/ML IJ SOLN
1.0000 mg | Freq: Once | INTRAMUSCULAR | Status: AC
  Administered 2013-01-19: 1 mg via INTRAVENOUS
  Filled 2013-01-19: qty 1

## 2013-01-19 MED ORDER — ONDANSETRON HCL 4 MG/2ML IJ SOLN
4.0000 mg | Freq: Once | INTRAMUSCULAR | Status: AC
  Administered 2013-01-19: 4 mg via INTRAVENOUS
  Filled 2013-01-19: qty 2

## 2013-01-19 NOTE — ED Notes (Signed)
Pt given discharge paperwork.  No additional questions by pt regarding d/c.  VSS. E-signature obtained. 

## 2013-01-19 NOTE — ED Notes (Signed)
Per report from [facility] pt contacted 911 for a cc of SOB, Chest pain, and fever.  She reportedly had a fever of 100.8 oral which was treated with Tylenol 1000mg  PO.  She states that she has been having generalized rib/chest pain and SOB.  Pt is able to talk in complete sentences.  She is also currently being treated for bilateral pulmonary emboli.  No distress noted.

## 2013-01-19 NOTE — ED Provider Notes (Signed)
History     CSN: [id number]  Arrival date & time 01/19/13  1451   First MD Initiated Contact with Patient 01/19/13 1502      Chief Complaint  Patient presents with  . Shortness of Breath  . Chest Pain    Patient is a 57 y.o. female presenting with fever. The history is provided by the patient.  Fever Max temp prior to arrival:  100.8 Severity:  Moderate Onset quality:  Gradual Duration:  12 hours Timing:  Constant Progression:  Worsening Chronicity:  New Relieved by:  Acetaminophen Worsened by:  Nothing tried Associated symptoms: chest pain, chills, cough, diarrhea, headaches, myalgias, nausea and vomiting   Associated symptoms: no confusion, no dysuria, no rash and no sore throat   PT reports she woke up this morning "choking and coughing" she had an episode of nonbloody vomitus and an episode of nonbloody stool.  She reports fever at home.  She reports increased cough and SOB and also chest wall pain.  She also reports abdominal pain.     Past Medical History  Diagnosis Date  . Chest pain   . Anxiety   . Depression   . UTI (urinary tract infection)   . Obesity   . Sinus node dysfunction      status post placement of    Medtronic pacemaker by Dr. Lewayne Bunting in 2009.   Marland Kitchen HTN (hypertension)   . Obesity   . Cystitis   . Palpitations   . Lung nodules   . History of urinary tract infection   . Pulmonary embolism     Complicating gastric bypass  . Sinoatrial node dysfunction     Past Surgical History  Procedure Laterality Date  . Pacemaker placement    . Appendectomy    . Cholecystectomy    . 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 Gastric bypass      No family history on file.  History  Substance Use Topics  . Smoking status: Never Smoker   . Smokeless tobacco: Never Used  . Alcohol Use: No    OB History   Grav Para Term Preterm Abortions TAB SAB Ect Mult Living                  Review of  Systems  Constitutional: Positive for fever and chills.  HENT: Negative for sore throat.   Respiratory: Positive for cough.   Cardiovascular: Positive for chest pain.  Gastrointestinal: Positive for nausea, vomiting and diarrhea.  Genitourinary: Negative for dysuria.  Musculoskeletal: Positive for myalgias.  Skin: Negative for rash.  Neurological: Positive for headaches.  Psychiatric/Behavioral: Negative for confusion.  All other systems reviewed and are negative.    Allergies  Codeine; Aliskiren fumarate; Eszopiclone; Iodine; Iohexol; Labetalol hcl; Penicillins; Tetanus toxoids; Topamax; and Vancomycin  Home Medications   Current Outpatient Rx  Name  Route  Sig  Dispense  Refill  . clonazePAM (KLONOPIN) 1 MG tablet   Oral   Take 1 tablet (1 mg total) by mouth 2 (two) times daily.   60 tablet   0   . cyanocobalamin 1000 MCG tablet   Oral   Take 100 mcg by mouth daily.         Marland Kitchen lisinopril (PRINIVIL,ZESTRIL) 10 MG tablet   Oral   Take 5 mg by mouth every morning.          . metoprolol (LOPRESSOR) 50 MG tablet   Oral  Take 50 mg by mouth 2 (two) times daily.          Marland Kitchen morphine (MS CONTIN) 15 MG 12 hr tablet   Oral   Take 15 mg by mouth 3 (three) times daily.          . nitrofurantoin (MACRODANTIN) 100 MG capsule   Oral   Take 100 mg by mouth daily. To keep from having UTI         . nystatin (MYCOSTATIN) 100000 UNIT/ML suspension   Oral   Take 1,000,000 Units by mouth 4 (four) times daily. Swish and swallow.         . promethazine (PHENERGAN) 25 MG tablet   Oral   Take 25 mg by mouth every 6 (six) hours as needed for nausea. For nausea and vomiting         . traZODone (DESYREL) 150 MG tablet   Oral   Take 150 mg by mouth at bedtime.         Marland Kitchen warfarin (COUMADIN) 5 MG tablet   Oral   Take 5 mg by mouth daily.           BP 156/86  Pulse 70  Temp(Src) 100.6 F (38.1 C) (Oral)  Resp 16  SpO2 99%  Physical Exam CONSTITUTIONAL: Well  developed/well nourished HEAD: Normocephalic/atraumatic EYES: EOMI/PERRL ENMT: Mucous membranes moist NECK: supple no meningeal signs SPINE:entire spine nontender CV: S1/S2 noted, no murmurs/rubs/gallops noted Chest - tender to palpation  LUNGS: no distress noted but limited due to body habitus ABDOMEN: soft, nontender, no rebound or guarding. She is obese GU:no cva tenderness NEURO: Pt is awake/alert, moves all extremitiesx4 EXTREMITIES: pulses normal, full ROM SKIN: warm, color normal PSYCH: no abnormalities of mood noted  ED Course  Procedures   Labs Reviewed  BASIC METABOLIC PANEL  CBC WITH DIFFERENTIAL  URINALYSIS, ROUTINE W REFLEX MICROSCOPIC    3:21 PM Pt with fever/cough/myalgias She is not septic appearing. Will obtain labs/cxr and reassess 6:34 PM Pt feels improved, ambulatory She is in no distress Stable for d/c, possible viral syndrome Doubt recurrent PE MDM  Nursing notes including past medical history and social history reviewed and considered in documentation Previous records reviewed and considered xrays reviewed and considered Labs/vital reviewed and considered        Date: 01/19/2013  Rate: 67  Rhythm: normal sinus rhythm  QRS Axis: right  Intervals: normal  ST/T Wave abnormalities: nonspecific ST changes  Conduction Disutrbances:right bundle branch block  Narrative Interpretation:   Old EKG Reviewed: changes noted previous EKG shows paced rhythm (01/14/2013) EKG from april  2012 shows right bundle branch block    Joya Gaskins, MD 01/19/13 1835

## 2013-01-21 ENCOUNTER — Emergency Department: Payer: Self-pay | Admitting: Emergency Medicine

## 2013-01-21 LAB — CBC
HGB: 13.5 g/dL (ref 12.0–16.0)
MCH: 32.1 pg (ref 26.0–34.0)
MCV: 98 fL (ref 80–100)

## 2013-01-21 LAB — URINALYSIS, COMPLETE
Bilirubin,UR: NEGATIVE
Glucose,UR: NEGATIVE mg/dL (ref 0–75)
Leukocyte Esterase: NEGATIVE
Ph: 5 (ref 4.5–8.0)
Specific Gravity: 1.023 (ref 1.003–1.030)
Squamous Epithelial: <1
WBC UR: 3 /HPF (ref 0–5)

## 2013-01-21 LAB — COMPREHENSIVE METABOLIC PANEL
Albumin: 2.9 g/dL — ABNORMAL LOW (ref 3.4–5.0)
Anion Gap: 4 — ABNORMAL LOW (ref 7–16)
BUN: 8 mg/dL (ref 7–18)
Calcium, Total: 7.7 mg/dL — ABNORMAL LOW (ref 8.5–10.1)
Chloride: 110 mmol/L — ABNORMAL HIGH (ref 98–107)
Co2: 26 mmol/L (ref 21–32)
EGFR (Non-African Amer.): >60
SGOT(AST): 69 U/L — ABNORMAL HIGH (ref 15–37)
SGPT (ALT): 98 U/L — ABNORMAL HIGH (ref 12–78)

## 2013-01-21 LAB — PROTIME-INR: Prothrombin Time: 22.6 secs — ABNORMAL HIGH (ref 11.5–14.7)

## 2013-01-22 ENCOUNTER — Other Ambulatory Visit: Payer: Self-pay | Admitting: Emergency Medicine

## 2013-01-22 LAB — CLOSTRIDIUM DIFFICILE BY PCR

## 2013-01-28 ENCOUNTER — Other Ambulatory Visit: Payer: Self-pay | Admitting: *Deleted

## 2013-01-28 DIAGNOSIS — I509 Heart failure, unspecified: Secondary | ICD-10-CM

## 2013-02-03 ENCOUNTER — Other Ambulatory Visit: Payer: Self-pay

## 2013-02-03 ENCOUNTER — Other Ambulatory Visit: Payer: Medicaid Other

## 2013-02-03 ENCOUNTER — Ambulatory Visit (INDEPENDENT_AMBULATORY_CARE_PROVIDER_SITE_OTHER): Payer: Medicaid Other

## 2013-02-03 ENCOUNTER — Other Ambulatory Visit (INDEPENDENT_AMBULATORY_CARE_PROVIDER_SITE_OTHER): Payer: Medicaid Other

## 2013-02-03 ENCOUNTER — Other Ambulatory Visit: Payer: Self-pay | Admitting: *Deleted

## 2013-02-03 DIAGNOSIS — Z7901 Long term (current) use of anticoagulants: Secondary | ICD-10-CM

## 2013-02-03 DIAGNOSIS — I509 Heart failure, unspecified: Secondary | ICD-10-CM

## 2013-02-03 DIAGNOSIS — I2699 Other pulmonary embolism without acute cor pulmonale: Secondary | ICD-10-CM

## 2013-02-03 LAB — PROTIME-INR

## 2013-02-03 LAB — POCT INR: INR: >6.9

## 2013-02-09 ENCOUNTER — Ambulatory Visit (INDEPENDENT_AMBULATORY_CARE_PROVIDER_SITE_OTHER): Payer: Medicaid Other

## 2013-02-09 DIAGNOSIS — I2699 Other pulmonary embolism without acute cor pulmonale: Secondary | ICD-10-CM

## 2013-02-09 DIAGNOSIS — Z7901 Long term (current) use of anticoagulants: Secondary | ICD-10-CM

## 2013-02-13 ENCOUNTER — Emergency Department: Payer: Self-pay | Admitting: Unknown Physician Specialty

## 2013-02-14 ENCOUNTER — Telehealth: Payer: Self-pay | Admitting: Internal Medicine

## 2013-02-14 DIAGNOSIS — K869 Disease of pancreas, unspecified: Secondary | ICD-10-CM

## 2013-02-14 NOTE — Telephone Encounter (Signed)
Patient had a CT angio while admitted for a PE they found a 2.1 cm mass in the liver.  She had a CT scan to further characterize the lesion and if shows that it is in the head of the pancrease and not the liver (scan done at a non [facility] facility she will fax and get a copy of the disk).  She was told she needs an EUS.  The patient has remote history with Dr. Leone Payor. I have explained that Dr. Leone Payor does not perform this procedure, but I will have one of his partners review the scan and advise.  She has had gastric bypass  in the early 90's and is on coumadin for her recent PE.

## 2013-02-15 ENCOUNTER — Encounter: Payer: Self-pay | Admitting: Pulmonary Disease

## 2013-02-15 ENCOUNTER — Ambulatory Visit (INDEPENDENT_AMBULATORY_CARE_PROVIDER_SITE_OTHER): Payer: Medicaid Other | Admitting: Pulmonary Disease

## 2013-02-15 ENCOUNTER — Encounter (HOSPITAL_COMMUNITY): Payer: Self-pay | Admitting: *Deleted

## 2013-02-15 ENCOUNTER — Telehealth: Payer: Self-pay

## 2013-02-15 ENCOUNTER — Other Ambulatory Visit: Payer: Self-pay

## 2013-02-15 VITALS — BP 118/82 | HR 60 | Temp 98.3°F | Ht 63.0 in | Wt 252.0 lb

## 2013-02-15 DIAGNOSIS — K869 Disease of pancreas, unspecified: Secondary | ICD-10-CM

## 2013-02-15 DIAGNOSIS — I2699 Other pulmonary embolism without acute cor pulmonale: Secondary | ICD-10-CM

## 2013-02-15 DIAGNOSIS — G4736 Sleep related hypoventilation in conditions classified elsewhere: Secondary | ICD-10-CM

## 2013-02-15 DIAGNOSIS — R0602 Shortness of breath: Secondary | ICD-10-CM

## 2013-02-15 DIAGNOSIS — E669 Obesity, unspecified: Secondary | ICD-10-CM | POA: Insufficient documentation

## 2013-02-15 NOTE — Assessment & Plan Note (Signed)
Per patient, she had a recent polysomnogram which showed that she did not need CPAP.  Therefore her deoxygentation at night is most likely related to her obesity.  Plan: -continue O2 at night -weight loss recommended

## 2013-02-15 NOTE — Telephone Encounter (Signed)
I reviewed the recent CT chest images and the report from the [facility] abd CT scan.  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  She has had bariatric surgery but still looks like EUS will be technically possible.  She is on coumadin for pretty recent PE.  Sheri, patty, She needs upper EUS, radial +/- linear, next available EUS Thursday, + MAC sedation, for pancreatic/peripancreatic lesion.  Will need to communicate with cardiologist about holding (or more likely bridging with lovenox) her coumadin.  Would want to hold coumadin for 5 days and can probably restart immediately after the procedure.

## 2013-02-15 NOTE — Patient Instructions (Addendum)
Keep taking warfarin for life Keep using your oxygen at night Follow up with Dr. Logan Bores should get the pancreatic mass biopsy as ordered  We will see you back as needed

## 2013-02-15 NOTE — Telephone Encounter (Signed)
Pt scheduled for 03/03/13 EUS 830 am Darcey Nora RN sent anti coag letter

## 2013-02-15 NOTE — Telephone Encounter (Signed)
Left message on machine to call back  

## 2013-02-15 NOTE — Progress Notes (Signed)
Subjective:    Patient ID: Susan Howell, female    DOB: 07-10-1956, 57 y.o.   MRN: [id number]  HPI Ms. Tollie Pizza comes to our clinic today for evaluation of pulmonary embolism and dyspnea. She had her first pulmonary blood in the 1979 3 days after gastric bypass surgery. She was treated with anticoagulation appropriately and then discontinued. In February 2014 she developed increasing shortness of breath and presented to the [facility] emergency room where she was found to have a large, bilateral pulmonary was with evidence of right heart strain and pulmonary infarct. She was treated with anticoagulation including warfarin and was discharged home with instructions to use oxygen at night. She has followed up with her cardiologist Dr. Graciela Husbands who recommended that she see [place] to help clarify the duration of anticoagulation needed.  She states that since hospital discharge she still has some shortness of breath on exertion. This has gotten better. She is using oxygen at night. She had a polysomnogram at [facility] one month ago and she says she was told that she does not need to use CPAP. She was told that she needs to use oxygen at night however. Since using oxygen at night she no longer has the sensation of tingling or numbness in her hands and feet.  She has no cough and rare wheezing. She gets short of breath with climbing a flight of stairs and walking at a brisk pace. She occasionally gets dizzy when she walks too fast.  In April 2014 she had an echocardiogram which showed no evidence of right heart strain.  Since her hospital discharge she has been found to have a 2 cm likely pancreatic mass and is to undergo a biopsy of this.  Past Medical History  Diagnosis Date  . Chest pain   . Anxiety   . Depression   . UTI (urinary tract infection)   . Obesity   . Sinus node dysfunction      status post placement of    Medtronic pacemaker by Dr. Lewayne Bunting in 2009.   Marland Kitchen HTN (hypertension)   . Obesity   .  Cystitis   . Palpitations   . Lung nodules   . History of urinary tract infection   . Pulmonary embolism     Complicating gastric bypass  . Sinoatrial node dysfunction      No family history on file.   History   Social History  . Marital Status: Married    Spouse Name: N/A    Number of Children: N/A  . Years of Education: N/A   Occupational History  . Not on file.   Social History Main Topics  . Smoking status: Never Smoker   . Smokeless tobacco: Never Used  . Alcohol Use: No  . Drug Use: No  . Sexually Active: Not Currently   Other Topics Concern  . Not on file   Social History Narrative  . No narrative on file     Allergies  Allergen Reactions  . Codeine Itching  . Aliskiren Fumarate     REACTION: ANXIETY // SYNCOPE  . Eszopiclone     REACTION: NIGHT MARES  . Iodine Other (See Comments)    seizures  . Iohexol      Desc: NAUSEA,VOMITING & SZ S/P CONTRAST INJ 30 YRS AGO// OK W/ PRE MEDS, BENADRYL & SOLUMEDROL TODAY//A.C., Onset Date: [id number]   . Labetalol Hcl Hypertension  . Penicillins Nausea And Vomiting    Cannot take by mouth  . Tetanus Toxoids  Swelling    Fever, flushing and hardening of the injection site  . Topamax Other (See Comments)    Confusion, high blood pressure.   . Vancomycin Nausea Only    Severe chills     Outpatient Prescriptions Prior to Visit  Medication Sig Dispense Refill  . clonazePAM (KLONOPIN) 1 MG tablet Take 1 tablet (1 mg total) by mouth 2 (two) times daily.  60 tablet  0  . cyanocobalamin 1000 MCG tablet Take 100 mcg by mouth daily.      Marland Kitchen lisinopril (PRINIVIL,ZESTRIL) 10 MG tablet Take 5 mg by mouth every morning.       . metoprolol (LOPRESSOR) 50 MG tablet Take 50 mg by mouth 2 (two) times daily.       Marland Kitchen morphine (MS CONTIN) 15 MG 12 hr tablet Take 15 mg by mouth 3 (three) times daily.       . nitrofurantoin (MACRODANTIN) 100 MG capsule Take 100 mg by mouth daily. To keep from having UTI      . nystatin (MYCOSTATIN)  100000 UNIT/ML suspension Take 1,000,000 Units by mouth 4 (four) times daily. Swish and swallow.      . promethazine (PHENERGAN) 25 MG tablet Take 25 mg by mouth every 6 (six) hours as needed for nausea. For nausea and vomiting      . traZODone (DESYREL) 150 MG tablet Take 150 mg by mouth at bedtime.      Marland Kitchen warfarin (COUMADIN) 5 MG tablet Take 5 mg by mouth daily.       No facility-administered medications prior to visit.      Review of Systems  Constitutional: Positive for chills and activity change. Negative for fever, diaphoresis, appetite change, fatigue and unexpected weight change.  HENT: Positive for congestion, rhinorrhea, neck pain, neck stiffness and postnasal drip. Negative for hearing loss, ear pain, nosebleeds, sore throat, facial swelling, sneezing, mouth sores, trouble swallowing, dental problem, voice change, sinus pressure, tinnitus and ear discharge.   Eyes: Negative for photophobia, discharge, itching and visual disturbance.  Respiratory: Positive for cough and shortness of breath. Negative for apnea, choking, chest tightness, wheezing and stridor.   Cardiovascular: Negative for chest pain, palpitations and leg swelling.  Gastrointestinal: Positive for nausea. Negative for vomiting, abdominal pain, constipation, blood in stool and abdominal distention.  Genitourinary: Negative for dysuria, urgency, frequency, hematuria, flank pain, decreased urine volume and difficulty urinating.  Musculoskeletal: Negative for myalgias, back pain, joint swelling, arthralgias and gait problem.  Skin: Negative for color change, pallor and rash.  Neurological: Positive for dizziness and light-headedness. Negative for tremors, seizures, syncope, speech difficulty, weakness, numbness and headaches.  Hematological: Negative for adenopathy. Does not bruise/bleed easily.  Psychiatric/Behavioral: Positive for confusion. Negative for sleep disturbance and agitation. The patient is nervous/anxious.         Objective:   Physical Exam  Filed Vitals:   02/15/13 1033  BP: 118/82  Pulse: 60  Temp: 98.3 F (36.8 C)  TempSrc: Oral  Height: 5\' 3"  (1.6 m)  Weight: 252 lb (114.306 kg)  SpO2: 95%   Room air  Gen: morbidly obese, chronically ill appearing, no acute distress HEENT: NCAT, PERRL, EOMi, OP clear, neck supple without masses PULM: CTA B CV: RRR, no mgr, no JVD AB: BS+, soft, nontender, no hsm Ext: warm, pitting edema edema R leg > L, no clubbing, no cyanosis Derm: no rash or skin breakdown Neuro: A&Ox4, CN II-XII intact, strength 5/5 in all 4 extremities  February 2014 CT scan chest>> large  bilateral pulmonary embolism with evidence of right heart strain, there is evidence of pulmonary infarct and right lower lobe, no other airspace abnormality  01/14/2013 CT chest>> near-complete resolution of bilateral pulmonary bladder, no evidence) strain, there are some vascular webs noted; 2.1 cm low density round mass in the porta hepatis     Assessment & Plan:   Pulmonary embolism This is her second pulmonary embolism, therefore her risk of a third catastrophic event is high enough to justify lifelong anticoagulation.  What concerns me more is the pancreatic mass, as malignancy can be associated with hypercoagulable states.  Plan: -lifelong anticoagulation with either warfarin or Rivaroxiban -if malignancy diagnosed, there is some data suggestive of using lovenox over warfarin for DVT/PE in the setting of malignancy; would defer to oncology if that turns out to be the case -agree with plans for pancreatic mass biopsy  SOB (shortness of breath) Multifactorial, most likely due to obesity and deconditioning.  Today's spirometry did not show COPD and her symptoms are not consistent with asthma.  Her recent echo did not show right heart strain which is reassuring.  Plan: -encouraged diet and exercise for weight loss and treatment of deconditioning.  Nocturnal hypoxemia due to  obesity Per patient, she had a recent polysomnogram which showed that she did not need CPAP.  Therefore her deoxygentation at night is most likely related to her obesity.  Plan: -continue O2 at night -weight loss recommended   Updated Medication List Outpatient Encounter Prescriptions as of 02/15/2013  Medication Sig Dispense Refill  . clonazePAM (KLONOPIN) 1 MG tablet Take 1 tablet (1 mg total) by mouth 2 (two) times daily.  60 tablet  0  . cyanocobalamin 1000 MCG tablet Take 100 mcg by mouth daily.      Marland Kitchen lisinopril (PRINIVIL,ZESTRIL) 10 MG tablet Take 5 mg by mouth every morning.       . metoprolol (LOPRESSOR) 50 MG tablet Take 50 mg by mouth 2 (two) times daily.       Marland Kitchen morphine (MS CONTIN) 15 MG 12 hr tablet Take 15 mg by mouth 3 (three) times daily.       . nitrofurantoin (MACRODANTIN) 100 MG capsule Take 100 mg by mouth daily. To keep from having UTI      . nystatin (MYCOSTATIN) 100000 UNIT/ML suspension Take 1,000,000 Units by mouth 4 (four) times daily. Swish and swallow.      . promethazine (PHENERGAN) 25 MG tablet Take 25 mg by mouth every 6 (six) hours as needed for nausea. For nausea and vomiting      . traZODone (DESYREL) 150 MG tablet Take 150 mg by mouth at bedtime.      Marland Kitchen warfarin (COUMADIN) 5 MG tablet Take 5 mg by mouth daily.       No facility-administered encounter medications on file as of 02/15/2013.

## 2013-02-15 NOTE — Telephone Encounter (Signed)
I have sent anticoagulation letter to Dr. Graciela Husbands.  I have left her a voicemail that we will be contacting her about the EUS appt today or tomorrow.

## 2013-02-15 NOTE — Telephone Encounter (Signed)
02/15/2013    RE: Susan Howell DOB: August 08, 1956 MRN: [id number]   Dear Dr. Graciela Husbands,    We have scheduled the above patient for an endoscopic procedure to evaluate a pancreatic mass. Our records show that she is on anticoagulation therapy of Coumadin.   Please advise as to how long the patient may come off her therapy of coumadin prior to the procedure, which is scheduled for 03/03/13.  Please route the completed form to Chales Abrahams or Darcey Nora at [facility].   Sincerely,  Rob Bunting, MD

## 2013-02-15 NOTE — Assessment & Plan Note (Signed)
This is her second pulmonary embolism, therefore her risk of a third catastrophic event is high enough to justify lifelong anticoagulation.  What concerns me more is the pancreatic mass, as malignancy can be associated with hypercoagulable states.  Plan: -lifelong anticoagulation with either warfarin or Rivaroxiban -if malignancy diagnosed, there is some data suggestive of using lovenox over warfarin for DVT/PE in the setting of malignancy; would defer to oncology if that turns out to be the case -agree with plans for pancreatic mass biopsy

## 2013-02-15 NOTE — Telephone Encounter (Signed)
Dr. [name] I have the copy of the CT scan I will place it on your desk.  Will you please review and advise if it is appropriate for an EUS or should I set her up for an office visit.

## 2013-02-15 NOTE — Telephone Encounter (Signed)
Pt has been instructed and meds reviewed pt will call with any questions or concerns after reviewing the info mailed 

## 2013-02-15 NOTE — Assessment & Plan Note (Signed)
Multifactorial, most likely due to obesity and deconditioning.  Today's spirometry did not show COPD and her symptoms are not consistent with asthma.  Her recent echo did not show right heart strain which is reassuring.  Plan: -encouraged diet and exercise for weight loss and treatment of deconditioning.

## 2013-02-22 ENCOUNTER — Telehealth: Payer: Self-pay | Admitting: Pulmonary Disease

## 2013-02-22 ENCOUNTER — Emergency Department (HOSPITAL_COMMUNITY)
Admission: EM | Admit: 2013-02-22 | Discharge: 2013-02-22 | Disposition: A | Discharge status: Home / Self Care | Payer: Medicaid Other | Attending: Emergency Medicine | Admitting: Emergency Medicine

## 2013-02-22 ENCOUNTER — Emergency Department (HOSPITAL_COMMUNITY): Payer: Medicaid Other

## 2013-02-22 ENCOUNTER — Encounter (HOSPITAL_COMMUNITY): Payer: Self-pay | Admitting: Emergency Medicine

## 2013-02-22 DIAGNOSIS — Z862 Personal history of diseases of the blood and blood-forming organs and certain disorders involving the immune mechanism: Secondary | ICD-10-CM | POA: Insufficient documentation

## 2013-02-22 DIAGNOSIS — Z79899 Other long term (current) drug therapy: Secondary | ICD-10-CM | POA: Insufficient documentation

## 2013-02-22 DIAGNOSIS — J209 Acute bronchitis, unspecified: Secondary | ICD-10-CM | POA: Insufficient documentation

## 2013-02-22 DIAGNOSIS — R071 Chest pain on breathing: Secondary | ICD-10-CM | POA: Insufficient documentation

## 2013-02-22 DIAGNOSIS — Z8709 Personal history of other diseases of the respiratory system: Secondary | ICD-10-CM | POA: Insufficient documentation

## 2013-02-22 DIAGNOSIS — Z8719 Personal history of other diseases of the digestive system: Secondary | ICD-10-CM | POA: Insufficient documentation

## 2013-02-22 DIAGNOSIS — F411 Generalized anxiety disorder: Secondary | ICD-10-CM | POA: Insufficient documentation

## 2013-02-22 DIAGNOSIS — Z950 Presence of cardiac pacemaker: Secondary | ICD-10-CM | POA: Insufficient documentation

## 2013-02-22 DIAGNOSIS — F3289 Other specified depressive episodes: Secondary | ICD-10-CM | POA: Insufficient documentation

## 2013-02-22 DIAGNOSIS — Z87448 Personal history of other diseases of urinary system: Secondary | ICD-10-CM | POA: Insufficient documentation

## 2013-02-22 DIAGNOSIS — G4730 Sleep apnea, unspecified: Secondary | ICD-10-CM | POA: Insufficient documentation

## 2013-02-22 DIAGNOSIS — Z86711 Personal history of pulmonary embolism: Secondary | ICD-10-CM | POA: Insufficient documentation

## 2013-02-22 DIAGNOSIS — Z880 Allergy status to penicillin: Secondary | ICD-10-CM | POA: Insufficient documentation

## 2013-02-22 DIAGNOSIS — R059 Cough, unspecified: Secondary | ICD-10-CM | POA: Insufficient documentation

## 2013-02-22 DIAGNOSIS — F329 Major depressive disorder, single episode, unspecified: Secondary | ICD-10-CM | POA: Insufficient documentation

## 2013-02-22 DIAGNOSIS — Z8679 Personal history of other diseases of the circulatory system: Secondary | ICD-10-CM | POA: Insufficient documentation

## 2013-02-22 DIAGNOSIS — Z87440 Personal history of urinary (tract) infections: Secondary | ICD-10-CM | POA: Insufficient documentation

## 2013-02-22 DIAGNOSIS — Z9884 Bariatric surgery status: Secondary | ICD-10-CM | POA: Insufficient documentation

## 2013-02-22 DIAGNOSIS — R0789 Other chest pain: Secondary | ICD-10-CM

## 2013-02-22 DIAGNOSIS — Z8639 Personal history of other endocrine, nutritional and metabolic disease: Secondary | ICD-10-CM | POA: Insufficient documentation

## 2013-02-22 DIAGNOSIS — I10 Essential (primary) hypertension: Secondary | ICD-10-CM | POA: Insufficient documentation

## 2013-02-22 DIAGNOSIS — Z7901 Long term (current) use of anticoagulants: Secondary | ICD-10-CM | POA: Insufficient documentation

## 2013-02-22 DIAGNOSIS — R05 Cough: Secondary | ICD-10-CM | POA: Insufficient documentation

## 2013-02-22 LAB — PROTIME-INR
INR: 1.58 — ABNORMAL HIGH (ref 0.00–1.49)
Prothrombin Time: 18.4 seconds — ABNORMAL HIGH (ref 11.6–15.2)

## 2013-02-22 LAB — CBC WITH DIFFERENTIAL/PLATELET
Basophils Absolute: 0 10*3/uL (ref 0.0–0.1)
HCT: 38 % (ref 36.0–46.0)
Lymphocytes Relative: 53 % — ABNORMAL HIGH (ref 12–46)
Monocytes Absolute: 1 10*3/uL (ref 0.1–1.0)
Neutro Abs: 3.5 10*3/uL (ref 1.7–7.7)
Platelets: 339 10*3/uL (ref 150–400)
RDW: 14.5 % (ref 11.5–15.5)
WBC: 9.6 10*3/uL (ref 4.0–10.5)

## 2013-02-22 LAB — BASIC METABOLIC PANEL
CO2: 29 mEq/L (ref 19–32)
Chloride: 106 mEq/L (ref 96–112)
Potassium: 3.8 mEq/L (ref 3.5–5.1)
Sodium: 141 mEq/L (ref 135–145)

## 2013-02-22 MED ORDER — METHYLPREDNISOLONE SODIUM SUCC 125 MG IJ SOLR
60.0000 mg | Freq: Once | INTRAMUSCULAR | Status: AC
  Administered 2013-02-22: 60 mg via INTRAVENOUS
  Filled 2013-02-22: qty 2

## 2013-02-22 MED ORDER — HYDROMORPHONE HCL PF 1 MG/ML IJ SOLN
1.0000 mg | Freq: Once | INTRAMUSCULAR | Status: AC
  Administered 2013-02-22: 1 mg via INTRAVENOUS
  Filled 2013-02-22: qty 1

## 2013-02-22 MED ORDER — HYDROCODONE-ACETAMINOPHEN 5-325 MG PO TABS: 1.0000 | ORAL_TABLET | ORAL | Status: DC | PRN

## 2013-02-22 MED ORDER — IOHEXOL 350 MG/ML SOLN
100.0000 mL | Freq: Once | INTRAVENOUS | Status: AC | PRN
  Administered 2013-02-22: 100 mL via INTRAVENOUS

## 2013-02-22 MED ORDER — LEVALBUTEROL HCL 1.25 MG/3ML IN NEBU
1.2500 mg | INHALATION_SOLUTION | Freq: Once | RESPIRATORY_TRACT | Status: AC
  Administered 2013-02-22: 1.25 mg via RESPIRATORY_TRACT
  Filled 2013-02-22: qty 3

## 2013-02-22 MED ORDER — LEVALBUTEROL TARTRATE 45 MCG/ACT IN AERO: 1.0000 | INHALATION_SPRAY | RESPIRATORY_TRACT | Status: DC | PRN

## 2013-02-22 MED ORDER — METOCLOPRAMIDE HCL 5 MG/ML IJ SOLN
10.0000 mg | Freq: Once | INTRAMUSCULAR | Status: AC
  Administered 2013-02-22: 10 mg via INTRAVENOUS
  Filled 2013-02-22: qty 2

## 2013-02-22 MED ORDER — DIPHENHYDRAMINE HCL 50 MG/ML IJ SOLN
25.0000 mg | Freq: Once | INTRAMUSCULAR | Status: AC
  Administered 2013-02-22: 25 mg via INTRAVENOUS
  Filled 2013-02-22: qty 1

## 2013-02-22 NOTE — Telephone Encounter (Signed)
Patient calling back.  Informed her of MQ recommendation.  She agreed and is going to go to the ER.  Wanted to thank Dr. Kendrick Fries.  Nothing else needed. [facility]

## 2013-02-22 NOTE — Telephone Encounter (Signed)
Pt seen at urgent care on 02-20-13 and dx with bronchitis.  Given Zpak, albuterol inh and flonase.  Pt took one dose of albuterol inh and had severe increased heart rate so hasnt taken again.  Still c/o non prod couhg, pain in right chest wall with cough, headaches, chills.  Denies fever.  Sats 94% on 3L.  Pt is on lifetime Coumadin.  Pt is concerned with chest wall pain due to h/o blood clots.  Please advise what pt should do from here.

## 2013-02-22 NOTE — ED Notes (Signed)
Pt here for cough with pain in right rib; pt sts hx of PE and told to come her to be checked; pt on home O2

## 2013-02-22 NOTE — ED Notes (Signed)
Dr. Glick at bedside.  

## 2013-02-22 NOTE — Telephone Encounter (Signed)
Given all her symptoms, especially the chest pain she really needs to go to the ER because I would end up sending her there anyway for a CT chest.

## 2013-02-22 NOTE — ED Provider Notes (Signed)
History     CSN: [id number]  Arrival date & time 02/22/13  1651   First MD Initiated Contact with Patient 02/22/13 1703      Chief Complaint  Patient presents with  . Cough  . Shortness of Breath    (Consider location/radiation/quality/duration/timing/severity/associated sxs/prior treatment) Patient is a 57 y.o. female presenting with cough and shortness of breath. The history is provided by the patient.  Cough Associated symptoms: shortness of breath   Shortness of Breath Associated symptoms: cough   She has had a cough productive of some thick sputum for the last 2 days. She was seen in urgent care where she was diagnosed with bronchitis and given albuterol. She states the albuterol made her heart race and and she does not wish to continue taking it. She denies fever but has had chills and sweats. She's developed pain in the right side of her chest which is worse with coughing and worse with deep breathing. She was diagnosed with pulmonary embolism 2 months ago and she talked with her pulmonary doctor who suggested she come in to be evaluated to make sure that she does not have new pulmonary emboli. She has noticed that her baseline dyspnea is worse. Also, her INR had gotten too high and she had to discontinue her warfarin for 3 days and then the last INR checked was subtherapeutic.  Past Medical History  Diagnosis Date  . Chest pain   . Anxiety   . Depression   . UTI (urinary tract infection)   . Obesity   . HTN (hypertension)   . Obesity   . Cystitis   . Palpitations   . Lung nodules   . History of urinary tract infection   . Pulmonary embolism     Complicating gastric bypass  . Hx of blood clots     Lungs  . Sleep apnea     no cpap now uses 02 2 l at night  . Sinus node dysfunction 2009     status post placement of    Medtronic pacemaker by Dr. Lewayne Bunting in 2009.   Marland Kitchen Sinoatrial node dysfunction   . Motility disorder, esophageal     Past Surgical History   Procedure Laterality Date  . Pacemaker placement    . Appendectomy    . Cholecystectomy    . 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 Gastric bypass      History reviewed. No pertinent family history.  History  Substance Use Topics  . Smoking status: Never Smoker   . Smokeless tobacco: Never Used  . Alcohol Use: No    OB History   Grav Para Term Preterm Abortions TAB SAB Ect Mult Living                  Review of Systems  Respiratory: Positive for cough and shortness of breath.   All other systems reviewed and are negative.    Allergies  Codeine; Aliskiren fumarate; Eszopiclone; Iodine; Iohexol; Labetalol hcl; Penicillins; Tetanus toxoids; Topamax; and Vancomycin  Home Medications   Current Outpatient Rx  Name  Route  Sig  Dispense  Refill  . clonazePAM (KLONOPIN) 1 MG tablet   Oral   Take 1 tablet (1 mg total) by mouth 2 (two) times daily.   60 tablet   0   . cyanocobalamin 1000 MCG tablet   Oral   Take 100 mcg by mouth daily.         Marland Kitchen  lisinopril (PRINIVIL,ZESTRIL) 10 MG tablet   Oral   Take 5 mg by mouth every morning.          . metoprolol (LOPRESSOR) 50 MG tablet   Oral   Take 50 mg by mouth 2 (two) times daily.          Marland Kitchen morphine (MS CONTIN) 15 MG 12 hr tablet   Oral   Take 15 mg by mouth 3 (three) times daily.          . nitrofurantoin (MACRODANTIN) 100 MG capsule   Oral   Take 100 mg by mouth daily. To keep from having UTI         . nystatin (MYCOSTATIN) 100000 UNIT/ML suspension   Oral   Take 1,000,000 Units by mouth 4 (four) times daily. Swish and swallow.         . promethazine (PHENERGAN) 25 MG tablet   Oral   Take 25 mg by mouth every 6 (six) hours as needed for nausea. For nausea and vomiting         . traZODone (DESYREL) 150 MG tablet   Oral   Take 150 mg by mouth at bedtime.         Marland Kitchen warfarin (COUMADIN) 5 MG tablet   Oral   Take 5 mg by mouth daily.            BP 140/75  Pulse 54  Temp(Src) 98 F (36.7 C) (Oral)  SpO2 99%  Physical Exam  Nursing note and vitals reviewed.  57 year old female, resting comfortably and in no acute distress. Vital signs are significant for bradycardia with heart rate of 54. Oxygen saturation is 99%, which is normal. Head is normocephalic and atraumatic. PERRLA, EOMI. Oropharynx is clear. Neck is nontender and supple without adenopathy or JVD. Back is nontender and there is no CVA tenderness. Lungs are clear without rales, wheezes, or rhonchi. Chest is moderately tender in the right lateral chest wall. Heart has regular rate and rhythm without murmur. Abdomen is soft, flat, nontender without masses or hepatosplenomegaly and peristalsis is normoactive. Extremities have no cyanosis or edema, full range of motion is present. Skin is warm and dry without rash. Neurologic: Mental status is normal, cranial nerves are intact, there are no motor or sensory deficits.  ED Course  Procedures (including critical care time)  Results for orders placed during the hospital encounter of 02/22/13  CBC WITH DIFFERENTIAL      Result Value Range   WBC 9.6  4.0 - 10.5 K/uL   RBC 4.05  3.87 - 5.11 MIL/uL   Hemoglobin 12.8  12.0 - 15.0 g/dL   HCT 56.2  13.0 - 86.5 %   MCV 93.8  78.0 - 100.0 fL   MCH 31.6  26.0 - 34.0 pg   MCHC 33.7  30.0 - 36.0 g/dL   RDW 78.4  69.6 - 29.5 %   Platelets 339  150 - 400 K/uL   Neutrophils Relative 36 (*) 43 - 77 %   Neutro Abs 3.5  1.7 - 7.7 K/uL   Lymphocytes Relative 53 (*) 12 - 46 %   Lymphs Abs 5.1 (*) 0.7 - 4.0 K/uL   Monocytes Relative 10  3 - 12 %   Monocytes Absolute 1.0  0.1 - 1.0 K/uL   Eosinophils Relative 1  0 - 5 %   Eosinophils Absolute 0.1  0.0 - 0.7 K/uL   Basophils Relative 0  0 - 1 %   Basophils  Absolute 0.0  0.0 - 0.1 K/uL  BASIC METABOLIC PANEL      Result Value Range   Sodium 141  135 - 145 mEq/L   Potassium 3.8  3.5 - 5.1 mEq/L   Chloride 106  96 - 112  mEq/L   CO2 29  19 - 32 mEq/L   Glucose, Bld 93  70 - 99 mg/dL   BUN 11  6 - 23 mg/dL   Creatinine, Ser 8.65  0.50 - 1.10 mg/dL   Calcium 9.0  8.4 - 78.4 mg/dL   GFR calc non Af Amer >90  >90 mL/min   GFR calc Af Amer >90  >90 mL/min  PROTIME-INR      Result Value Range   Prothrombin Time 18.4 (*) 11.6 - 15.2 seconds   INR 1.58 (*) 0.00 - 1.49   Ct Angio Chest W/cm &/or Wo Cm  02/22/2013  *RADIOLOGY REPORT*  Clinical Data: Right-sided chest pain.  Cough.  Short of breath. Prior pulmonary embolism.  CT ANGIOGRAPHY CHEST  Technique:  Multidetector CT imaging of the chest using the standard protocol during bolus administration of intravenous contrast. Multiplanar reconstructed images including MIPs were obtained and reviewed to evaluate the vascular anatomy.  Contrast: OMNIPAQUE IOHEXOL 350 MG/ML SOLN  Comparison: CT chest 12/12/2012.  Findings: Resolution of previously seen pulmonary emboli.  No recurrent pulmonary embolus is identified.  Respiratory motion is present on the examination.  No gross acute aortic abnormality.  No axillary adenopathy.  Patulous thoracic esophagus.  Incidental upper abdominal findings appears similar to prior exam.  Left diaphragmatic hernia and eventration.  Eventration of the right hemidiaphragm appears similar to prior exam.  There is no airspace disease.  Dependent atelectasis is present in the lungs.  6 mm right upper lobe pulmonary nodule appears similar to the prior examination of 12/12/2012.  Cardiomegaly.  Partially visualized pacemaker leads.  Thoracolumbar fixation hardware is present. Chronic thoracic spine compression fractures.  Probable splenic remnant in the left upper quadrant.  Staples are present compatible with prior splenectomy.  Cholecystectomy.  No aggressive osseous lesions.  IMPRESSION: 1.  No recurrent pulmonary embolus or acute abnormality in the chest. 2.  Resolution of previously seen pulmonary emboli.  No chronic pulmonary embolus is  present. 3.  Stable chronic changes of the chest and abdomen.   Original Report Authenticated By: Andreas Newport, M.D.       1. Acute bronchitis   2. Chest wall pain       MDM  Bronchitis with chest wall pain. Old records are reviewed and she had been hospitalized for a pulmonary embolism in February. She had been seen in the ED one month ago and had repeat CT because of concern about possible new emboli but scan showed gradual resolution of existing emboli and no new emboli. INR was greater than 6.9 on April 10. On April 16, INR was 1.2. This time with a subtherapeutic INR does put her at risk for recurrent emboli so CT scan will be repeated today. Because of the questionable history of allergy to IV dye, she is pretreated with diphenhydramine and prednisolone.   CT shows no evidence of new pulmonary emboli and complete resolution of old pulmonary emboli. INR is still subtherapeutic and patient needs to followup with her Coumadin clinic to adjust to the dose appropriately. She got some improvement of cough with breathing treatment with Xopenex without as much problem with rapid heartbeat. She is discharged with prescription for Xopenex inhaler and the Norco  for pain and cough.     Dione Booze, MD 02/22/13 2114

## 2013-02-23 ENCOUNTER — Telehealth: Payer: Self-pay

## 2013-02-23 ENCOUNTER — Encounter (HOSPITAL_COMMUNITY): Payer: Self-pay | Admitting: Pharmacy Technician

## 2013-02-23 ENCOUNTER — Encounter: Payer: Self-pay | Admitting: Pulmonary Disease

## 2013-02-23 ENCOUNTER — Ambulatory Visit (INDEPENDENT_AMBULATORY_CARE_PROVIDER_SITE_OTHER): Payer: Medicaid Other

## 2013-02-23 DIAGNOSIS — Z7901 Long term (current) use of anticoagulants: Secondary | ICD-10-CM

## 2013-02-23 DIAGNOSIS — I2699 Other pulmonary embolism without acute cor pulmonale: Secondary | ICD-10-CM

## 2013-02-23 LAB — POCT INR: INR: 1.7

## 2013-02-23 NOTE — Telephone Encounter (Signed)
Pt seen in Coumadin Clinic today reports upcoming procedure with Dr [name] per telephone note in [facility] on 02/14/13 pt needs to hold Coumadin 5 days prior to procedure.  Upper EUS procedure is scheduled for 03/03/13.  Clearance was sent to Dr Graciela Husbands, but she is on Coumadin for recent B PE on 12/10/12.  Needs clearance from pulmonologist to hold Coumadin please advise.  Pt has been on Coumadin therapy less than 3 months.

## 2013-02-23 NOTE — Telephone Encounter (Signed)
Pt went to ED with bronchitis symptoms yesterday, CT of chest was done to rule out PE.  Available for review.  Pt states she was told blood clots were resolved.

## 2013-02-23 NOTE — Telephone Encounter (Signed)
Message per BQ sent to patient. Will sign off on message.

## 2013-02-23 NOTE — Telephone Encounter (Signed)
BQ, please advise on this patient. Also, the days available to see patient on your schedule are 5-12, 5-13, and 5-20; please advise what day you would like to see patient as she needs 7 days from 03-03-13(day of biopsy). Thanks.

## 2013-02-23 NOTE — Telephone Encounter (Signed)
Please respond with the following:  Susan Howell,  I recommend that you follow up with your primary care physician as you will not be able to see me in 7 days.  Unfortunately I don't have appointments available until May 12 and you should be seen sooner than that to follow up on your bronchitis and your low INR.  Your primary care physician is able to handle these problems.  You will have to ask the surgeon if the change in the size of the mass affects your surgery.  I don't know the answer to that question.  You should also let them know you have bronchitis.  If Dr. Preston Fleeting recommended that you stop the z-pack then you should follow his instructions.  Good luck.  [employer]

## 2013-02-23 NOTE — Telephone Encounter (Signed)
Sent email back to patient. Will sign off on phone note.

## 2013-02-23 NOTE — Telephone Encounter (Signed)
Will route to BQ for review as well. Pt sent Pt e-mail as well and BQ is aware.

## 2013-02-24 ENCOUNTER — Telehealth: Payer: Self-pay | Admitting: Pulmonary Disease

## 2013-02-24 NOTE — Telephone Encounter (Signed)
Called pt scheduled appt in Coumadin Clinic in [place] for 02/25/13 for Lovenox bridging instructions. See telephone note from 02/23/13 procedure on 03/03/13 hold Coumadin 5 days prior.

## 2013-02-24 NOTE — Telephone Encounter (Signed)
She should be bridged with lovenox. The time off of full dose anticoagulation is up to the surgeon (endoscopist).  I recommend that she resume full dose anticoagulation as soon as they deem it safe.

## 2013-02-24 NOTE — Telephone Encounter (Signed)
See phone note from Pulm dated 02/24/13 Msg sent to Dr. Kendrick Fries to address

## 2013-02-24 NOTE — Telephone Encounter (Signed)
Spoke with Cicero Duck, RN with Coumadin Clinic  Pt was seen by the Coumadin Clinic for the first time on 02/23/13 They are needing to know if okay to come off of therapy for the bx that the pt has planned for 03/03/13 The issue is that the pt has not been on therapy for even 3 months, and they rec that patients are on therapy at least 6 months before holding therapy Dr Kendrick Fries, please advise your recs Cicero Duck states the clinic can bridge her if this is what you want Please advise thanks!

## 2013-02-25 ENCOUNTER — Ambulatory Visit (INDEPENDENT_AMBULATORY_CARE_PROVIDER_SITE_OTHER): Payer: Medicaid Other | Admitting: *Deleted

## 2013-02-25 ENCOUNTER — Telehealth: Payer: Self-pay | Admitting: Internal Medicine

## 2013-02-25 DIAGNOSIS — Z7901 Long term (current) use of anticoagulants: Secondary | ICD-10-CM

## 2013-02-25 DIAGNOSIS — I2699 Other pulmonary embolism without acute cor pulmonale: Secondary | ICD-10-CM

## 2013-02-25 LAB — POCT INR: INR: 3.3

## 2013-02-25 MED ORDER — ENOXAPARIN SODIUM 120 MG/0.8ML ~~LOC~~ SOLN: 120.0000 mg | Freq: Two times a day (BID) | SUBCUTANEOUS | Status: DC

## 2013-02-25 NOTE — Telephone Encounter (Signed)
Discussed with dr Graciela Husbands, pt is clear to hold her coumadin prior to her procedure.

## 2013-02-25 NOTE — Telephone Encounter (Signed)
Message left on home and cell phone with instructions to hold coumadin 5 days prior to procedure ( stop on 02/26/13 )  Pt was also asked to call and let me know that she received the message.

## 2013-02-25 NOTE — Telephone Encounter (Signed)
Will discuss with dr Graciela Husbands

## 2013-02-25 NOTE — Telephone Encounter (Signed)
New problem   Clayborne Dana stated pt is having sx next week and need to know if pt can stop coumadin. Please call Clayborne Dana

## 2013-02-25 NOTE — Patient Instructions (Addendum)
02/25/2013 no coumadin  02/26/2013 no coumadin no lovenox 02/27/2013 no coumadin Lovenox 120 mg 8am and Lovenox 120 mg 8pm 02/28/2013  No coumadin Lovenox 120 mg  8am and Lovenox 120 mg 8PM 03/01/2013 no coumadin Lovenox 120 mg 8am and Lovenox 120 mg 8pm 03/02/2013 no coumadin Lovenox 120 mg 8am only 03/03/2013 day of procedure no lovenox and no coumadin when MD instructs to restart coumadin take extra 1/2 tablet for 2 days and restart lovenox as on previously and continue lovenox and coumadin until seen in clinic on Wednesday  03/09/2013

## 2013-02-28 NOTE — Telephone Encounter (Signed)
Called patient and she has already stopped her Coumadin for procedure.

## 2013-03-01 ENCOUNTER — Encounter: Payer: Self-pay | Admitting: Pulmonary Disease

## 2013-03-01 ENCOUNTER — Ambulatory Visit (INDEPENDENT_AMBULATORY_CARE_PROVIDER_SITE_OTHER): Payer: Medicaid Other | Admitting: Pulmonary Disease

## 2013-03-01 VITALS — BP 140/80 | HR 62 | Temp 98.5°F | Ht 60.0 in | Wt 250.0 lb

## 2013-03-01 DIAGNOSIS — R911 Solitary pulmonary nodule: Secondary | ICD-10-CM

## 2013-03-01 DIAGNOSIS — I2699 Other pulmonary embolism without acute cor pulmonale: Secondary | ICD-10-CM

## 2013-03-01 DIAGNOSIS — R059 Cough, unspecified: Secondary | ICD-10-CM

## 2013-03-01 DIAGNOSIS — R05 Cough: Secondary | ICD-10-CM

## 2013-03-01 DIAGNOSIS — R0602 Shortness of breath: Secondary | ICD-10-CM

## 2013-03-01 NOTE — Progress Notes (Signed)
Subjective:    Patient ID: Susan Howell, female    DOB: 05/14/1956, 56 y.o.   MRN: [id number]  Synopsis: Susan Howell first saw the [facility] in April 2014 for evaluation of a pulmonary embolism. She had pulmonary embolism in February 2014 which was the second she had had in her lifetime. She had evidence of RV strain after this and was treated with warfarin and Lovenox. She followed up with cardiology who repeated an echocardiogram in April of 2014 which showed resolution of the RV strain. On her initial visit with [facility] she had normal spirometry. She was treated with oxygen by her primary care physician for nocturnal hypoxemia of obesity. In April 2014 she had an episode of bronchitis and had a repeat CT scan of her chest which showed no parenchymal abnormalities other than a 6 mm right upper lobe pulmonary nodule. This also showed resolution of her previously seen pulmonary embolism.  HPI  03/01/13 ROV -- Susan Howell had bronchitis approximately one week ago and went to an urgent care where she was prescribed azithromycin. She initially had green sputum production but this as instructed up since starting the azithromycin. She still has a mild, dry cough and some sinus congestion and postnasal drip. She actually went to the emergency department last week because she was experiencing right-sided chest pain. She had chest CT which showed complete resolution of the previously seen pulmonary embolism and she had no parenchymal abnormalities other than a 6 mm right upper lobe nodule. She has since had resolution of the purulent sputum production chest pain. She notes shortness of breath on exertion which has been going on for several years.   Past Medical History  Diagnosis Date  . Chest pain   . Anxiety   . Depression   . UTI (urinary tract infection)   . Obesity   . HTN (hypertension)   . Obesity   . Cystitis   . Palpitations   . Lung nodules   . History of  urinary tract infection   . Pulmonary embolism     Complicating gastric bypass  . Hx of blood clots     Lungs  . Sleep apnea     no cpap now uses 02 2 l at night  . Sinus node dysfunction 2009     status post placement of    Medtronic pacemaker by Dr. Gregg Taylor in 2009.   . Sinoatrial node dysfunction   . Motility disorder, esophageal   . CHF (congestive heart failure)      Review of Systems  Constitutional: Positive for fatigue.  Respiratory: Positive for cough, shortness of breath and wheezing.   Cardiovascular: Positive for leg swelling. Negative for chest pain and palpitations.       Objective:   Physical Exam Filed Vitals:   03/01/13 1608  BP: 140/80  Pulse: 62  Temp: 98.5 F (36.9 C)  TempSrc: Oral  Height: 5' (1.524 m)  Weight: 250 lb (113.399 kg)  SpO2: 94%    Gen: chronically ill appearing, morbidly obese HEENT: NCAT, EOMi, OP clear, neck supple without masses PULM: CTA B CV: RRR, no mgr, no JVD AB: BS+, soft, nontender, no hsm Ext: warm, some pitting edema noted, no clubbing, no cyanosis      Assessment & Plan:   PULMONARY EMBOLISM She had her second pulmonary embolism in 2014.  Plan: -Warfarin for life managed by primary care physician. -Okay to use Lovenox to bridge for procedures  SOB (shortness of breath)   We talked about this at length today. She does not have evidence of RV strain on her most recent echo, her most recent CT scan showed no lung disease or pulmonary emboli, and she does not have COPD on simple spirometry. I explained to her that further testing with a cardiopulmonary exercise test could be performed but this would likely to be low yield. The most likely explanation for her shortness of breath is her obesity and deconditioning.  Plan: -Diet and exercise  Cough She had a recent episode of bronchitis which has improved. Allergic rhinitis and postnasal drip are contributing to ongoing for congestion and  cough.  Plan: -Over-the-counter second generation antihistamine -Nasal rinses with saline -Guaifenesin when necessary  Solitary pulmonary nodule This was seen on the February 2014 CT chest and was stable on the 01/2013 study.  There are no high risk features but it needs to be followed.  Plan: -f/u imaging 11/2013    Updated Medication List Outpatient Encounter Prescriptions as of 03/01/2013  Medication Sig Dispense Refill  . albuterol (PROAIR HFA) 108 (90 BASE) MCG/ACT inhaler Inhale 2 puffs into the lungs every 6 (six) hours as needed for wheezing.      . clonazePAM (KLONOPIN) 1 MG tablet Take 1 tablet (1 mg total) by mouth 2 (two) times daily.  60 tablet  0  . cyanocobalamin 1000 MCG tablet Take 100 mcg by mouth daily.      . enoxaparin (LOVENOX) 120 MG/0.8ML injection Inject 0.8 mLs (120 mg total) into the skin every 12 (twelve) hours.  20 Syringe  0  . fluticasone (FLONASE) 50 MCG/ACT nasal spray Place 2 sprays into the nose daily.      . HYDROcodone-acetaminophen (NORCO/VICODIN) 5-325 MG per tablet Take 1 tablet by mouth every 4 (four) hours as needed for pain (or cough).  20 tablet  0  . metoprolol (LOPRESSOR) 50 MG tablet Take 50 mg by mouth 2 (two) times daily.       . morphine (MS CONTIN) 15 MG 12 hr tablet Take 15 mg by mouth 3 (three) times daily.       . promethazine (PHENERGAN) 25 MG tablet Take 25 mg by mouth every 6 (six) hours as needed for nausea. For nausea and vomiting      . traZODone (DESYREL) 150 MG tablet Take 150 mg by mouth at bedtime.      . lisinopril (PRINIVIL,ZESTRIL) 5 MG tablet Take 5 mg by mouth every morning.      . warfarin (COUMADIN) 5 MG tablet Take 5 mg by mouth daily. Take 5mg on all days except take 2.5 mg on fridays      . [DISCONTINUED] azithromycin (ZITHROMAX) 250 MG tablet Take 250 mg by mouth daily. Take 2 tablets by mouth today, then take 1 tablet daily for 4 days. Patient states she is on her third dose as of today 02/22/13      .  [DISCONTINUED] levalbuterol (XOPENEX HFA) 45 MCG/ACT inhaler Inhale 1-2 puffs into the lungs every 4 (four) hours as needed for wheezing.  1 Inhaler  12   No facility-administered encounter medications on file as of 03/01/2013.      

## 2013-03-01 NOTE — Assessment & Plan Note (Addendum)
She had her second pulmonary embolism in 2014.  Plan: -Warfarin for life managed by primary care physician. -Okay to use Lovenox to bridge for procedures

## 2013-03-01 NOTE — Assessment & Plan Note (Signed)
She had a recent episode of bronchitis which has improved. Allergic rhinitis and postnasal drip are contributing to ongoing for congestion and cough.  Plan: -Over-the-counter second generation antihistamine -Nasal rinses with saline -Guaifenesin when necessary

## 2013-03-01 NOTE — Assessment & Plan Note (Signed)
We talked about this at length today. She does not have evidence of RV strain on her most recent echo, her most recent CT scan showed no lung disease or pulmonary emboli, and she does not have COPD on simple spirometry. I explained to her that further testing with a cardiopulmonary exercise test could be performed but this would likely to be low yield. The most likely explanation for her shortness of breath is her obesity and deconditioning.  Plan: -Diet and exercise

## 2013-03-01 NOTE — Patient Instructions (Signed)
Take zyrtec, allegra, or claritin (or their generic equivalents) for your sinus congestion Use [facility] [employer] [facility] saline rinses (or the recipe we gave you) for your sinus congestion  Keep taking guaifenesin as you are doing  You need to exercise and lose weight  You should be on anticoagulation for life  You need to follow up with your primary care physician for further management of the anticoagulation and coordination of your biopsy and results

## 2013-03-02 DIAGNOSIS — R911 Solitary pulmonary nodule: Secondary | ICD-10-CM | POA: Insufficient documentation

## 2013-03-02 NOTE — Assessment & Plan Note (Signed)
This was seen on the February 2014 CT chest and was stable on the 01/2013 study.  There are no high risk features but it needs to be followed.  Plan: -f/u imaging 11/2013

## 2013-03-03 ENCOUNTER — Ambulatory Visit (HOSPITAL_COMMUNITY)
Admission: RE | Admit: 2013-03-03 | Discharge: 2013-03-03 | Disposition: A | Discharge status: Home / Self Care | Payer: Medicaid Other | Source: Ambulatory Visit | Attending: Gastroenterology | Admitting: Gastroenterology

## 2013-03-03 ENCOUNTER — Ambulatory Visit (HOSPITAL_COMMUNITY): Payer: Medicaid Other | Admitting: Anesthesiology

## 2013-03-03 ENCOUNTER — Encounter (HOSPITAL_COMMUNITY): Payer: Self-pay | Admitting: Anesthesiology

## 2013-03-03 ENCOUNTER — Encounter (HOSPITAL_COMMUNITY): Payer: Self-pay | Admitting: Gastroenterology

## 2013-03-03 ENCOUNTER — Telehealth: Payer: Self-pay | Admitting: *Deleted

## 2013-03-03 ENCOUNTER — Encounter (HOSPITAL_COMMUNITY)
Admission: RE | Disposition: A | Discharge status: Home / Self Care | Payer: Self-pay | Source: Ambulatory Visit | Attending: Gastroenterology

## 2013-03-03 DIAGNOSIS — Z9884 Bariatric surgery status: Secondary | ICD-10-CM | POA: Insufficient documentation

## 2013-03-03 DIAGNOSIS — R5381 Other malaise: Secondary | ICD-10-CM | POA: Insufficient documentation

## 2013-03-03 DIAGNOSIS — Z8709 Personal history of other diseases of the respiratory system: Secondary | ICD-10-CM | POA: Insufficient documentation

## 2013-03-03 DIAGNOSIS — I509 Heart failure, unspecified: Secondary | ICD-10-CM | POA: Insufficient documentation

## 2013-03-03 DIAGNOSIS — R05 Cough: Secondary | ICD-10-CM | POA: Insufficient documentation

## 2013-03-03 DIAGNOSIS — R933 Abnormal findings on diagnostic imaging of other parts of digestive tract: Secondary | ICD-10-CM

## 2013-03-03 DIAGNOSIS — R5383 Other fatigue: Secondary | ICD-10-CM | POA: Insufficient documentation

## 2013-03-03 DIAGNOSIS — R059 Cough, unspecified: Secondary | ICD-10-CM | POA: Insufficient documentation

## 2013-03-03 DIAGNOSIS — G4730 Sleep apnea, unspecified: Secondary | ICD-10-CM | POA: Insufficient documentation

## 2013-03-03 DIAGNOSIS — J3489 Other specified disorders of nose and nasal sinuses: Secondary | ICD-10-CM | POA: Insufficient documentation

## 2013-03-03 DIAGNOSIS — Z86711 Personal history of pulmonary embolism: Secondary | ICD-10-CM | POA: Insufficient documentation

## 2013-03-03 DIAGNOSIS — R062 Wheezing: Secondary | ICD-10-CM | POA: Insufficient documentation

## 2013-03-03 DIAGNOSIS — Z950 Presence of cardiac pacemaker: Secondary | ICD-10-CM | POA: Insufficient documentation

## 2013-03-03 DIAGNOSIS — K869 Disease of pancreas, unspecified: Secondary | ICD-10-CM

## 2013-03-03 DIAGNOSIS — R911 Solitary pulmonary nodule: Secondary | ICD-10-CM | POA: Insufficient documentation

## 2013-03-03 DIAGNOSIS — M7989 Other specified soft tissue disorders: Secondary | ICD-10-CM | POA: Insufficient documentation

## 2013-03-03 DIAGNOSIS — I10 Essential (primary) hypertension: Secondary | ICD-10-CM | POA: Insufficient documentation

## 2013-03-03 DIAGNOSIS — K319 Disease of stomach and duodenum, unspecified: Secondary | ICD-10-CM | POA: Insufficient documentation

## 2013-03-03 HISTORY — DX: Sleep apnea, unspecified: G47.30

## 2013-03-03 HISTORY — DX: Dyskinesia of esophagus: K22.4

## 2013-03-03 HISTORY — DX: Headache: R51

## 2013-03-03 HISTORY — DX: Presence of cardiac pacemaker: Z95.0

## 2013-03-03 HISTORY — DX: Nontoxic single thyroid nodule: E04.1

## 2013-03-03 HISTORY — PX: EUS: SHX5427

## 2013-03-03 HISTORY — DX: Unspecified osteoarthritis, unspecified site: M19.90

## 2013-03-03 HISTORY — DX: Fibromyalgia: M79.7

## 2013-03-03 SURGERY — UPPER ENDOSCOPIC ULTRASOUND (EUS) LINEAR
Anesthesia: Monitor Anesthesia Care

## 2013-03-03 MED ORDER — ONDANSETRON HCL 4 MG/2ML IJ SOLN
INTRAMUSCULAR | Status: DC | PRN
  Administered 2013-03-03: 4 mg via INTRAVENOUS

## 2013-03-03 MED ORDER — MEPERIDINE HCL 100 MG/ML IJ SOLN: 6.2500 mg | INTRAMUSCULAR | Status: DC | PRN

## 2013-03-03 MED ORDER — LACTATED RINGERS IV SOLN
INTRAVENOUS | Status: DC | PRN
  Administered 2013-03-03: 08:00:00 via INTRAVENOUS

## 2013-03-03 MED ORDER — LACTATED RINGERS IV SOLN
INTRAVENOUS | Status: DC
  Administered 2013-03-03: 09:00:00 via INTRAVENOUS

## 2013-03-03 MED ORDER — BUTAMBEN-TETRACAINE-BENZOCAINE 2-2-14 % EX AERO
INHALATION_SPRAY | CUTANEOUS | Status: DC | PRN
  Administered 2013-03-03: 1 via TOPICAL

## 2013-03-03 MED ORDER — PROMETHAZINE HCL 25 MG/ML IJ SOLN: 6.2500 mg | INTRAMUSCULAR | Status: DC | PRN

## 2013-03-03 MED ORDER — MIDAZOLAM HCL 5 MG/5ML IJ SOLN
INTRAMUSCULAR | Status: DC | PRN
  Administered 2013-03-03: 2 mg via INTRAVENOUS

## 2013-03-03 MED ORDER — PROPOFOL 10 MG/ML IV BOLUS
INTRAVENOUS | Status: DC | PRN
  Administered 2013-03-03 (×2): 20 mg via INTRAVENOUS

## 2013-03-03 MED ORDER — PROPOFOL 10 MG/ML IV EMUL
INTRAVENOUS | Status: DC | PRN
  Administered 2013-03-03: 100 ug/kg/min via INTRAVENOUS

## 2013-03-03 MED ORDER — SODIUM CHLORIDE 0.9 % IV SOLN: INTRAVENOUS | Status: DC

## 2013-03-03 NOTE — Transfer of Care (Signed)
Immediate Anesthesia Transfer of Care Note  Patient: Susan Howell  Procedure(s) Performed: Procedure(s): UPPER ENDOSCOPIC ULTRASOUND (EUS) LINEAR (N/A)  Patient Location: PACU and Endoscopy Unit  Anesthesia Type:MAC  Level of Consciousness: awake, alert , oriented and patient cooperative  Airway & Oxygen Therapy: Patient Spontanous Breathing and Patient connected to nasal cannula oxygen  Post-op Assessment: Report given to PACU RN, Post -op Vital signs reviewed and stable and Patient moving all extremities  Post vital signs: Reviewed and stable  Complications: No apparent anesthesia complications

## 2013-03-03 NOTE — Telephone Encounter (Signed)
Spoke with the pt and notified of recs per Dr Kendrick Fries She verbalized understanding and denied any questions She prefers that Dr Kendrick Fries handles this I have sent myself reminder for this to be set up

## 2013-03-03 NOTE — Interval H&P Note (Signed)
History and Physical Interval Note:  03/03/2013 8:15 AM  Susan Howell  has presented today for surgery, with the diagnosis of incidentally noted peripancreatic lesion on CT done for PE [577.9]  The various methods of treatment have been discussed with the patient and family. After consideration of risks, benefits and other options for treatment, the patient has consented to  Procedure(s): UPPER ENDOSCOPIC ULTRASOUND (EUS) LINEAR (N/A) as a surgical intervention .  The patient's history has been reviewed, patient examined, no change in status, stable for surgery.  I have reviewed the patient's chart and labs.  Questions were answered to the patient's satisfaction.     Rob Bunting

## 2013-03-03 NOTE — H&P (View-Only) (Signed)
Subjective:    Patient ID: Susan Howell, female    DOB: 17-Sep-1956, 57 y.o.   MRN: [id number]  Synopsis: Susan Howell first saw the [facility] pulmonary clinic in April 2014 for evaluation of a pulmonary embolism. She had pulmonary embolism in February 2014 which was the second she had had in her lifetime. She had evidence of RV strain after this and was treated with warfarin and Lovenox. She followed up with cardiology who repeated an echocardiogram in April of 2014 which showed resolution of the RV strain. On her initial visit with Susan Howell pulmonary she had normal spirometry. She was treated with oxygen by her primary care physician for nocturnal hypoxemia of obesity. In April 2014 she had an episode of bronchitis and had a repeat CT scan of her chest which showed no parenchymal abnormalities other than a 6 mm right upper lobe pulmonary nodule. This also showed resolution of her previously seen pulmonary embolism.  HPI  03/01/13 ROV -- Susan Howell had bronchitis approximately one week ago and went to an urgent care where she was prescribed azithromycin. She initially had green sputum production but this as instructed up since starting the azithromycin. She still has a mild, dry cough and some sinus congestion and postnasal drip. She actually went to the emergency department last week because she was experiencing right-sided chest pain. She had chest CT which showed complete resolution of the previously seen pulmonary embolism and she had no parenchymal abnormalities other than a 6 mm right upper lobe nodule. She has since had resolution of the purulent sputum production chest pain. She notes shortness of breath on exertion which has been going on for several years.   Past Medical History  Diagnosis Date  . Chest pain   . Anxiety   . Depression   . UTI (urinary tract infection)   . Obesity   . HTN (hypertension)   . Obesity   . Cystitis   . Palpitations   . Lung nodules   . History of  urinary tract infection   . Pulmonary embolism     Complicating gastric bypass  . Hx of blood clots     Lungs  . Sleep apnea     no cpap now uses 02 2 l at night  . Sinus node dysfunction 2009     status post placement of    Medtronic pacemaker by Dr. Lewayne Bunting in 2009.   Marland Kitchen Sinoatrial node dysfunction   . Motility disorder, esophageal   . CHF (congestive heart failure)      Review of Systems  Constitutional: Positive for fatigue.  Respiratory: Positive for cough, shortness of breath and wheezing.   Cardiovascular: Positive for leg swelling. Negative for chest pain and palpitations.       Objective:   Physical Exam Filed Vitals:   03/01/13 1608  BP: 140/80  Pulse: 62  Temp: 98.5 F (36.9 C)  TempSrc: Oral  Height: 5' (1.524 m)  Weight: 250 lb (113.399 kg)  SpO2: 94%    Gen: chronically ill appearing, morbidly obese HEENT: NCAT, EOMi, OP clear, neck supple without masses PULM: CTA B CV: RRR, no mgr, no JVD AB: BS+, soft, nontender, no hsm Ext: warm, some pitting edema noted, no clubbing, no cyanosis      Assessment & Plan:   PULMONARY EMBOLISM She had her second pulmonary embolism in 2014.  Plan: -Warfarin for life managed by primary care physician. -Okay to use Lovenox to bridge for procedures  SOB (shortness of breath)  We talked about this at length today. She does not have evidence of RV strain on her most recent echo, her most recent CT scan showed no lung disease or pulmonary emboli, and she does not have COPD on simple spirometry. I explained to her that further testing with a cardiopulmonary exercise test could be performed but this would likely to be low yield. The most likely explanation for her shortness of breath is her obesity and deconditioning.  Plan: -Diet and exercise  Cough She had a recent episode of bronchitis which has improved. Allergic rhinitis and postnasal drip are contributing to ongoing for congestion and  cough.  Plan: -Over-the-counter second generation antihistamine -Nasal rinses with saline -Guaifenesin when necessary  Solitary pulmonary nodule This was seen on the February 2014 CT chest and was stable on the 01/2013 study.  There are no high risk features but it needs to be followed.  Plan: -f/u imaging 11/2013    Updated Medication List Outpatient Encounter Prescriptions as of 03/01/2013  Medication Sig Dispense Refill  . albuterol (PROAIR HFA) 108 (90 BASE) MCG/ACT inhaler Inhale 2 puffs into the lungs every 6 (six) hours as needed for wheezing.      . clonazePAM (KLONOPIN) 1 MG tablet Take 1 tablet (1 mg total) by mouth 2 (two) times daily.  60 tablet  0  . cyanocobalamin 1000 MCG tablet Take 100 mcg by mouth daily.      Marland Kitchen enoxaparin (LOVENOX) 120 MG/0.8ML injection Inject 0.8 mLs (120 mg total) into the skin every 12 (twelve) hours.  20 Syringe  0  . fluticasone (FLONASE) 50 MCG/ACT nasal spray Place 2 sprays into the nose daily.      Marland Kitchen HYDROcodone-acetaminophen (NORCO/VICODIN) 5-325 MG per tablet Take 1 tablet by mouth every 4 (four) hours as needed for pain (or cough).  20 tablet  0  . metoprolol (LOPRESSOR) 50 MG tablet Take 50 mg by mouth 2 (two) times daily.       Marland Kitchen morphine (MS CONTIN) 15 MG 12 hr tablet Take 15 mg by mouth 3 (three) times daily.       . promethazine (PHENERGAN) 25 MG tablet Take 25 mg by mouth every 6 (six) hours as needed for nausea. For nausea and vomiting      . traZODone (DESYREL) 150 MG tablet Take 150 mg by mouth at bedtime.      Marland Kitchen lisinopril (PRINIVIL,ZESTRIL) 5 MG tablet Take 5 mg by mouth every morning.      . warfarin (COUMADIN) 5 MG tablet Take 5 mg by mouth daily. Take 5mg  on all days except take 2.5 mg on fridays      . [DISCONTINUED] azithromycin (ZITHROMAX) 250 MG tablet Take 250 mg by mouth daily. Take 2 tablets by mouth today, then take 1 tablet daily for 4 days. Patient states she is on her third dose as of today 02/22/13      .  [DISCONTINUED] levalbuterol (XOPENEX HFA) 45 MCG/ACT inhaler Inhale 1-2 puffs into the lungs every 4 (four) hours as needed for wheezing.  1 Inhaler  12   No facility-administered encounter medications on file as of 03/01/2013.

## 2013-03-03 NOTE — Anesthesia Postprocedure Evaluation (Signed)
  Anesthesia Post-op Note  Patient: Susan Howell  Procedure(s) Performed: Procedure(s) (LRB): UPPER ENDOSCOPIC ULTRASOUND (EUS) LINEAR (N/A)  Patient Location: PACU  Anesthesia Type: MAC  Level of Consciousness: awake and alert   Airway and Oxygen Therapy: Patient Spontanous Breathing  Post-op Pain: mild  Post-op Assessment: Post-op Vital signs reviewed, Patient's Cardiovascular Status Stable, Respiratory Function Stable, Patent Airway and No signs of Nausea or vomiting  Last Vitals:  Filed Vitals:   03/03/13 1020  BP: 123/62  Temp:   Resp: 26    Post-op Vital Signs: stable   Complications: No apparent anesthesia complications

## 2013-03-03 NOTE — Telephone Encounter (Signed)
Message copied by Christen Butter on Thu Mar 03, 2013  1:47 PM ------      Message from: Max Fickle B      Created: Wed Mar 02, 2013  9:11 PM       L,            Can you call her and tell her that she had a pulmonary nodule on her February CT chest?  Tell her that is very small and most likely nothing to worry about, but she needs another CT scan in February 2015 and she can see me after that.  It didn't change on the most recent CT which is good.            It can be handled by her PCP, but I'm OK with doing it if she can't get her PCP to manage it.            Thanks,      B ------

## 2013-03-03 NOTE — Op Note (Signed)
[facility] [address] [place], [address]   ENDOSCOPIC ULTRASOUND PROCEDURE REPORT  PATIENT: Susan Howell, Susan Howell  MR#: [id number] BIRTHDATE: November 07, 1955  GENDER: Female ENDOSCOPIST: Rachael Fee, MD REFERRED BY:  Lupita Leash, M.D. PROCEDURE DATE:  03/03/2013 PROCEDURE:   Upper EUS w/FNA ASA CLASS:      Class III INDICATIONS:   incidentally noted lesion in or near pancreatic head on recent chest CT done for PE. MEDICATIONS: MAC sedation, administered by CRNA  DESCRIPTION OF PROCEDURE:   After the risks benefits and alternatives of the procedure were  explained, informed consent was obtained. The patient was then placed in the left, lateral, decubitus postion and IV sedation was administered. Throughout the procedure, the patients blood pressure, pulse and oxygen saturations were monitored continuously.  Under direct visualization, the Pentax Radial EUS [id number]  endoscope was introduced through the mouth  and advanced to the second portion of the duodenum .  Water was used as necessary to provide an acoustic interface.  Upon completion of the imaging, water was removed and the patient was sent to the recovery room in satisfactory condition.  Endoscopic findings: 1. UGI anatomy surgically altered from remote bariatric surgery, currently similar to Bilroth I. EUS findings: 1. There were two soft tissue lesions seen. 2. The first was round, 1.9cm across, located near but not in the pancreatic head and abutting the right lobe of liver. This lesion is hypoechoic, heterogeneous.  I sampled the lesion with three passes of a 25 guage EUS FNA needle. 3. The second lesion abutted the gastric wall along left lateral side of remnant stomach. This is 4cm across, indistinctly bordered, hypoechoic. It may focally communicate with the muscularis propria layer of gastric wall.  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  I did not sample the lesion. 4. Limited views of pancreatic parenchyma, liver were all normal. Impression: Two abnormal lesions in abdomen. The largest is most likely splenic remnant abutting gastric wall, stable since 2010 by imaging.  The other, which was the lesion for which she had this procedure, was about 2cm, round and is of unclear etiology.  FNA performed with initial reading appearing to be cystic content.  Await final cytology for formal recommendations.  She can resume lovenox shots twice daily later today, resume coumadin today as well.    _______________________________ eSigned:  Rachael Fee, MD 03/03/2013 9:57 AM

## 2013-03-03 NOTE — Anesthesia Preprocedure Evaluation (Signed)
Anesthesia Evaluation  Patient identified by MRN, date of birth, ID band Patient awake    Reviewed: Allergy & Precautions, H&P , NPO status , Patient's Chart, lab work & pertinent test results  Airway Mallampati: II TM Distance: >3 FB Neck ROM: Full    Dental no notable dental hx.    Pulmonary neg pulmonary ROS, sleep apnea and Oxygen sleep apnea , PE breath sounds clear to auscultation  Pulmonary exam normal       Cardiovascular hypertension, Pt. on medications +CHF negative cardio ROS  + pacemaker Rhythm:Regular Rate:Normal     Neuro/Psych negative neurological ROS  negative psych ROS   GI/Hepatic negative GI ROS, Neg liver ROS,   Endo/Other  negative endocrine ROS  Renal/GU negative Renal ROS  negative genitourinary   Musculoskeletal negative musculoskeletal ROS (+)   Abdominal   Peds negative pediatric ROS (+)  Hematology negative hematology ROS (+)   Anesthesia Other Findings   Reproductive/Obstetrics negative OB ROS                           Anesthesia Physical Anesthesia Plan  ASA: III  Anesthesia Plan: MAC   Post-op Pain Management:    Induction:   Airway Management Planned:   Additional Equipment:   Intra-op Plan:   Post-operative Plan:   Informed Consent: I have reviewed the patients History and Physical, chart, labs and discussed the procedure including the risks, benefits and alternatives for the proposed anesthesia with the patient or authorized representative who has indicated his/her understanding and acceptance.   Dental advisory given  Plan Discussed with:   Anesthesia Plan Comments:         Anesthesia Quick Evaluation

## 2013-03-04 ENCOUNTER — Telehealth: Payer: Self-pay | Admitting: Pulmonary Disease

## 2013-03-04 ENCOUNTER — Encounter (HOSPITAL_COMMUNITY): Payer: Self-pay | Admitting: Gastroenterology

## 2013-03-04 NOTE — Telephone Encounter (Signed)
I called and spoke with pt and notified her of the ct results again She verbalized understanding and denied any questions Nothing further needed

## 2013-03-04 NOTE — Telephone Encounter (Signed)
I will forward this message to [place] since the pt asked to speak to her.

## 2013-03-09 ENCOUNTER — Ambulatory Visit (INDEPENDENT_AMBULATORY_CARE_PROVIDER_SITE_OTHER): Payer: Medicaid Other

## 2013-03-09 DIAGNOSIS — I2699 Other pulmonary embolism without acute cor pulmonale: Secondary | ICD-10-CM

## 2013-03-09 DIAGNOSIS — Z7901 Long term (current) use of anticoagulants: Secondary | ICD-10-CM

## 2013-03-09 LAB — POCT INR: INR: 1.3

## 2013-03-09 MED ORDER — ENOXAPARIN SODIUM 120 MG/0.8ML ~~LOC~~ SOLN: 120.0000 mg | Freq: Two times a day (BID) | SUBCUTANEOUS | Status: DC

## 2013-03-10 ENCOUNTER — Telehealth (HOSPITAL_COMMUNITY): Payer: Self-pay | Admitting: Emergency Medicine

## 2013-03-14 ENCOUNTER — Ambulatory Visit (INDEPENDENT_AMBULATORY_CARE_PROVIDER_SITE_OTHER): Payer: Medicaid Other

## 2013-03-14 DIAGNOSIS — Z7901 Long term (current) use of anticoagulants: Secondary | ICD-10-CM

## 2013-03-14 DIAGNOSIS — I2699 Other pulmonary embolism without acute cor pulmonale: Secondary | ICD-10-CM

## 2013-03-14 LAB — POCT INR: INR: 1.8

## 2013-03-24 ENCOUNTER — Ambulatory Visit (INDEPENDENT_AMBULATORY_CARE_PROVIDER_SITE_OTHER): Payer: Medicaid Other

## 2013-03-24 DIAGNOSIS — Z7901 Long term (current) use of anticoagulants: Secondary | ICD-10-CM

## 2013-03-24 DIAGNOSIS — I2699 Other pulmonary embolism without acute cor pulmonale: Secondary | ICD-10-CM

## 2013-03-27 ENCOUNTER — Emergency Department (HOSPITAL_COMMUNITY): Payer: Medicaid Other

## 2013-03-27 ENCOUNTER — Other Ambulatory Visit (HOSPITAL_COMMUNITY): Payer: Medicaid Other

## 2013-03-27 ENCOUNTER — Emergency Department (HOSPITAL_COMMUNITY)
Admission: EM | Admit: 2013-03-27 | Discharge: 2013-03-27 | Disposition: A | Discharge status: Home / Self Care | Payer: Medicaid Other | Attending: Emergency Medicine | Admitting: Emergency Medicine

## 2013-03-27 ENCOUNTER — Encounter (HOSPITAL_COMMUNITY): Payer: Self-pay | Admitting: Nurse Practitioner

## 2013-03-27 DIAGNOSIS — R0989 Other specified symptoms and signs involving the circulatory and respiratory systems: Secondary | ICD-10-CM | POA: Insufficient documentation

## 2013-03-27 DIAGNOSIS — Z7901 Long term (current) use of anticoagulants: Secondary | ICD-10-CM | POA: Insufficient documentation

## 2013-03-27 DIAGNOSIS — J9819 Other pulmonary collapse: Secondary | ICD-10-CM | POA: Insufficient documentation

## 2013-03-27 DIAGNOSIS — Z79899 Other long term (current) drug therapy: Secondary | ICD-10-CM | POA: Insufficient documentation

## 2013-03-27 DIAGNOSIS — Z8742 Personal history of other diseases of the female genital tract: Secondary | ICD-10-CM | POA: Insufficient documentation

## 2013-03-27 DIAGNOSIS — Z87440 Personal history of urinary (tract) infections: Secondary | ICD-10-CM | POA: Insufficient documentation

## 2013-03-27 DIAGNOSIS — Z8669 Personal history of other diseases of the nervous system and sense organs: Secondary | ICD-10-CM | POA: Insufficient documentation

## 2013-03-27 DIAGNOSIS — F3289 Other specified depressive episodes: Secondary | ICD-10-CM | POA: Insufficient documentation

## 2013-03-27 DIAGNOSIS — Z8739 Personal history of other diseases of the musculoskeletal system and connective tissue: Secondary | ICD-10-CM | POA: Insufficient documentation

## 2013-03-27 DIAGNOSIS — F411 Generalized anxiety disorder: Secondary | ICD-10-CM | POA: Insufficient documentation

## 2013-03-27 DIAGNOSIS — I10 Essential (primary) hypertension: Secondary | ICD-10-CM | POA: Insufficient documentation

## 2013-03-27 DIAGNOSIS — Z8679 Personal history of other diseases of the circulatory system: Secondary | ICD-10-CM | POA: Insufficient documentation

## 2013-03-27 DIAGNOSIS — J9811 Atelectasis: Secondary | ICD-10-CM

## 2013-03-27 DIAGNOSIS — Z86718 Personal history of other venous thrombosis and embolism: Secondary | ICD-10-CM | POA: Insufficient documentation

## 2013-03-27 DIAGNOSIS — R0609 Other forms of dyspnea: Secondary | ICD-10-CM | POA: Insufficient documentation

## 2013-03-27 DIAGNOSIS — R05 Cough: Secondary | ICD-10-CM | POA: Insufficient documentation

## 2013-03-27 DIAGNOSIS — Z8719 Personal history of other diseases of the digestive system: Secondary | ICD-10-CM | POA: Insufficient documentation

## 2013-03-27 DIAGNOSIS — R0600 Dyspnea, unspecified: Secondary | ICD-10-CM

## 2013-03-27 DIAGNOSIS — Z8709 Personal history of other diseases of the respiratory system: Secondary | ICD-10-CM | POA: Insufficient documentation

## 2013-03-27 DIAGNOSIS — I509 Heart failure, unspecified: Secondary | ICD-10-CM | POA: Insufficient documentation

## 2013-03-27 DIAGNOSIS — Z950 Presence of cardiac pacemaker: Secondary | ICD-10-CM | POA: Insufficient documentation

## 2013-03-27 DIAGNOSIS — F329 Major depressive disorder, single episode, unspecified: Secondary | ICD-10-CM | POA: Insufficient documentation

## 2013-03-27 DIAGNOSIS — R509 Fever, unspecified: Secondary | ICD-10-CM | POA: Insufficient documentation

## 2013-03-27 DIAGNOSIS — E669 Obesity, unspecified: Secondary | ICD-10-CM | POA: Insufficient documentation

## 2013-03-27 DIAGNOSIS — R079 Chest pain, unspecified: Secondary | ICD-10-CM | POA: Insufficient documentation

## 2013-03-27 DIAGNOSIS — R059 Cough, unspecified: Secondary | ICD-10-CM | POA: Insufficient documentation

## 2013-03-27 LAB — BASIC METABOLIC PANEL
Calcium: 8.6 mg/dL (ref 8.4–10.5)
Creatinine, Ser: 0.73 mg/dL (ref 0.50–1.10)
GFR calc non Af Amer: >90 mL/min (ref >90)
Sodium: 139 mEq/L (ref 135–145)

## 2013-03-27 LAB — POCT I-STAT TROPONIN I: Troponin i, poc: 0 ng/mL (ref 0.00–0.08)

## 2013-03-27 LAB — APTT: aPTT: 35 seconds (ref 24–37)

## 2013-03-27 LAB — CBC
HCT: 38.9 % (ref 36.0–46.0)
Hemoglobin: 13 g/dL (ref 12.0–15.0)
MCH: 32.1 pg (ref 26.0–34.0)
MCHC: 33.4 g/dL (ref 30.0–36.0)
MCV: 96 fL (ref 78.0–100.0)
Platelets: 324 10*3/uL (ref 150–400)
RBC: 4.05 MIL/uL (ref 3.87–5.11)
RDW: 15 % (ref 11.5–15.5)
WBC: 7.5 K/uL (ref 4.0–10.5)

## 2013-03-27 LAB — POCT I-STAT, CHEM 8
BUN: 9 mg/dL (ref 6–23)
Calcium, Ion: 1.16 mmol/L (ref 1.12–1.23)
Chloride: 105 meq/L (ref 96–112)
Creatinine, Ser: 0.8 mg/dL (ref 0.50–1.10)
Glucose, Bld: 135 mg/dL — ABNORMAL HIGH (ref 70–99)
HCT: 41 % (ref 36.0–46.0)
Hemoglobin: 13.9 g/dL (ref 12.0–15.0)
Potassium: 4 meq/L (ref 3.5–5.1)
Sodium: 143 mEq/L (ref 135–145)
TCO2: 29 mmol/L (ref 0–100)

## 2013-03-27 LAB — BASIC METABOLIC PANEL WITH GFR
BUN: 9 mg/dL (ref 6–23)
CO2: 29 meq/L (ref 19–32)
Chloride: 105 meq/L (ref 96–112)
GFR calc Af Amer: >90 mL/min (ref >90)
Glucose, Bld: 142 mg/dL — ABNORMAL HIGH (ref 70–99)
Potassium: 3.9 meq/L (ref 3.5–5.1)

## 2013-03-27 LAB — PROTIME-INR
INR: 1.93 — ABNORMAL HIGH (ref 0.00–1.49)
Prothrombin Time: 21.3 s — ABNORMAL HIGH (ref 11.6–15.2)

## 2013-03-27 LAB — PRO B NATRIURETIC PEPTIDE: Pro B Natriuretic peptide (BNP): 262.4 pg/mL — ABNORMAL HIGH (ref 0–125)

## 2013-03-27 MED ORDER — DIPHENHYDRAMINE HCL 50 MG/ML IJ SOLN
50.0000 mg | Freq: Once | INTRAMUSCULAR | Status: AC
  Administered 2013-03-27: 50 mg via INTRAVENOUS
  Filled 2013-03-27: qty 1

## 2013-03-27 MED ORDER — IOHEXOL 350 MG/ML SOLN
80.0000 mL | Freq: Once | INTRAVENOUS | Status: AC | PRN
  Administered 2013-03-27: 80 mL via INTRAVENOUS

## 2013-03-27 MED ORDER — MORPHINE SULFATE 4 MG/ML IJ SOLN
4.0000 mg | Freq: Once | INTRAMUSCULAR | Status: AC
  Administered 2013-03-27: 4 mg via INTRAVENOUS
  Filled 2013-03-27: qty 1

## 2013-03-27 MED ORDER — SODIUM CHLORIDE 0.9 % IV SOLN
Freq: Once | INTRAVENOUS | Status: AC
  Administered 2013-03-27: 15:00:00 via INTRAVENOUS

## 2013-03-27 MED ORDER — MORPHINE SULFATE ER 15 MG PO TBCR
15.0000 mg | EXTENDED_RELEASE_TABLET | Freq: Two times a day (BID) | ORAL | Status: DC
  Filled 2013-03-27: qty 1

## 2013-03-27 MED ORDER — HYDROCORTISONE SOD SUCCINATE 100 MG IJ SOLR
100.0000 mg | Freq: Once | INTRAMUSCULAR | Status: AC
  Administered 2013-03-27: 100 mg via INTRAVENOUS
  Filled 2013-03-27: qty 2

## 2013-03-27 NOTE — ED Provider Notes (Addendum)
History     CSN: [id number]  Arrival date & time 03/27/13  1321   First MD Initiated Contact with Patient 03/27/13 1351      Chief Complaint  Patient presents with  . Shortness of Breath    (Consider location/radiation/quality/duration/timing/severity/associated sxs/prior treatment) HPI Comments: Pt with no prior h/o MI, has a pacemaker, followed by Dr. Graciela Husbands due to heart pauses, did have it changed in 2009 due to malfunction.  Pt also with h/o PE and pulmonary infarction, pneumonia after gastric bypass surgery on lifetime coumadin.  She had a biopsy of a cyst about 1 month ago, was stopped on coumadin and bridged with lovenox, now back on coumadin, but INR has remained subtherapeutic, pt has had right side pleuritic pain as bad as 6/10 pain, SOB, mild cough, reports her usual temp is 97 and she has been 99 for a few days.  She is concerned for recurrent PE. She has had several CT angios in the past 2 years.  I brought this up with patient to be made aware of the dangers of recurrent CT's and radiation.  Pt is on home O2, feels SOB, worse with exertion for past 1-2 weeks even with O2 on.  No wheezing.  No h/o COPD, smoking, asthma.  Has had CHF in the setting of problems with pacemaker and also right heart strain in setting of PE.  Has not taken any specific meds for symptoms but is on MS contin already for back problems which has not helped pleurisy that much  Patient is a 57 y.o. female presenting with shortness of breath. The history is provided by the patient and a relative.  Shortness of Breath Associated symptoms: chest pain, cough and fever   Associated symptoms: no abdominal pain and no vomiting     Past Medical History  Diagnosis Date  . Chest pain   . Anxiety   . Depression   . UTI (urinary tract infection)   . Obesity   . HTN (hypertension)   . Obesity   . Cystitis   . Palpitations   . Lung nodules   . History of urinary tract infection   . Pulmonary embolism    Complicating gastric bypass  . Hx of blood clots     Lungs  . CHF (congestive heart failure)   . Sleep apnea     no cpap now uses 02 2 l at night  . Pacemaker     medtronic  . Sinus node dysfunction 2009     status post placement of    Medtronic pacemaker by Dr. Lewayne Bunting in 2009.   Marland Kitchen Sinoatrial node dysfunction   . Headache(784.0)   . Motility disorder, esophageal   . Arthritis   . Fibromyalgia   . Thyroid nodule     Past Surgical History  Procedure Laterality Date  . Pacemaker placement      x2, pacemaker leads replaced  . Appendectomy    . Cholecystectomy    . 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 Gastric bypass    . Stomach surgery      "2/3 of stomach removed"  . Abdominal hysterectomy    . Ectopic pregnancy surgery    . Foot surgery      both feet, and ankles  . Knee surgery      bilateral  . Rib resection    . Abdominal surgery      muscle deteriorated in  abdomen  . Eus N/A 03/03/2013    Procedure: UPPER ENDOSCOPIC ULTRASOUND (EUS) LINEAR;  Surgeon: Rachael Fee, MD;  Location: [facility] END[facility];  Service: Endoscopy;  Laterality: N/A;    History reviewed. No pertinent family history.  History  Substance Use Topics  . Smoking status: Never Smoker   . Smokeless tobacco: Never Used  . Alcohol Use: No    OB History   Grav Para Term Preterm Abortions TAB SAB Ect Mult Living                  Review of Systems  Constitutional: Positive for fever. Negative for chills.  Respiratory: Positive for cough and shortness of breath.   Cardiovascular: Positive for chest pain. Negative for palpitations and leg swelling.  Gastrointestinal: Negative for nausea, vomiting and abdominal pain.  All other systems reviewed and are negative.    Allergies  Codeine; Aliskiren fumarate; Eszopiclone; Iodine; Iohexol; Labetalol hcl; Penicillins; Tetanus toxoids; Topamax; and Vancomycin  Home Medications   Current  Outpatient Rx  Name  Route  Sig  Dispense  Refill  . albuterol (PROAIR HFA) 108 (90 BASE) MCG/ACT inhaler   Inhalation   Inhale 2 puffs into the lungs every 6 (six) hours as needed for wheezing.         . clonazePAM (KLONOPIN) 1 MG tablet   Oral   Take 1 tablet (1 mg total) by mouth 2 (two) times daily.   60 tablet   0   . cyanocobalamin 1000 MCG tablet   Oral   Take 100 mcg by mouth daily.         . fluticasone (FLONASE) 50 MCG/ACT nasal spray   Nasal   Place 2 sprays into the nose daily.         Marland Kitchen lisinopril (PRINIVIL,ZESTRIL) 5 MG tablet   Oral   Take 5 mg by mouth every morning.         . methylcellulose (ARTIFICIAL TEARS) 1 % ophthalmic solution   Both Eyes   Place 2 drops into both eyes as needed (for dry eyes).          . metoprolol (LOPRESSOR) 50 MG tablet   Oral   Take 50 mg by mouth 2 (two) times daily.          Marland Kitchen morphine (MS CONTIN) 15 MG 12 hr tablet   Oral   Take 15 mg by mouth 3 (three) times daily.          . nitrofurantoin (MACRODANTIN) 100 MG capsule   Oral   Take 100 mg by mouth every evening.         . promethazine (PHENERGAN) 25 MG tablet   Oral   Take 25 mg by mouth every 6 (six) hours as needed for nausea. For nausea and vomiting         . traZODone (DESYREL) 150 MG tablet   Oral   Take 150 mg by mouth at bedtime.         Marland Kitchen warfarin (COUMADIN) 5 MG tablet   Oral   Take 5 mg by mouth every evening. Takes 7.5mg  on mon, wed and fri each week Takes 5mg  all other days           BP 145/67  Pulse 58  Temp(Src) 98.2 F (36.8 C) (Oral)  Resp 13  SpO2 100%  Physical Exam  Nursing note and vitals reviewed. Constitutional: She appears well-developed and well-nourished. No distress.  HENT:  Head: Normocephalic and atraumatic.  Eyes: Conjunctivae and EOM are normal. No scleral icterus.  Neck: Neck supple.  Cardiovascular: Normal rate, regular rhythm and intact distal pulses.   No murmur heard. Pulmonary/Chest:  Effort normal. She has no wheezes. She has no rales. She exhibits no tenderness.  Abdominal: Soft. She exhibits no distension. There is no tenderness. There is no rebound.  Musculoskeletal: She exhibits no edema and no tenderness.  Neurological: She is alert.  Skin: Skin is warm and dry. She is not diaphoretic.  Psychiatric: She has a normal mood and affect.    ED Course  Procedures (including critical care time)  Labs Reviewed  BASIC METABOLIC PANEL - Abnormal; Notable for the following:    Glucose, Bld 142 (*)    All other components within normal limits  PRO B NATRIURETIC PEPTIDE - Abnormal; Notable for the following:    Pro B Natriuretic peptide (BNP) 262.4 (*)    All other components within normal limits  PROTIME-INR - Abnormal; Notable for the following:    Prothrombin Time 21.3 (*)    INR 1.93 (*)    All other components within normal limits  POCT I-STAT, CHEM 8 - Abnormal; Notable for the following:    Glucose, Bld 135 (*)    All other components within normal limits  CBC  APTT  URINALYSIS, ROUTINE W REFLEX MICROSCOPIC  POCT I-STAT TROPONIN I   Dg Chest 2 View  03/27/2013   *RADIOLOGY REPORT*  Clinical Data: Shortness of breath, chest pain  CHEST - 2 VIEW  Comparison: None.  Findings: Low lung volumes.  Lungs are essentially clear.  No focal consolidation. No pleural effusion or pneumothorax.  Focal eventration of the right hemidiaphragm.  Cardiomegaly.  Left subclavian pacemaker.  Exaggerated thoracic kyphosis.  Moderate compression fracture deformity at L1 with associated posterior surgical fixation from T12-L3, chronic.  Cholecystectomy clips.  IMPRESSION: No evidence of acute cardiopulmonary disease.   Original Report Authenticated By: Charline Bills, M.D.   Ct Angio Chest W/cm &/or Wo Cm  03/27/2013   *RADIOLOGY REPORT*  Clinical Data: Shortness of breath  CT ANGIOGRAPHY CHEST  Technique:  Multidetector CT imaging of the chest using the standard protocol during bolus  administration of intravenous contrast. Multiplanar reconstructed images including MIPs were obtained and reviewed to evaluate the vascular anatomy.  Contrast: 80mL OMNIPAQUE IOHEXOL 350 MG/ML SOLN  Comparison: 03/27/2013 chest radiograph.  Chest CT most recent exam 01/14/2013  Findings: Artifact from left-sided pacer in place.  Great vessels are normal in caliber.  Heart size upper limits of normal.  No pericardial effusion.  Trace right basilar mild loculated pleural fluid image 58 with associated compressive atelectasis.  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  No lymphadenopathy.  The study is of adequate technical quality for evaluation for pulmonary embolism up to and including the 3rd order pulmonary arteries. No focal filling defect is seen to suggest acute pulmonary embolism.  Main pulmonary artery diameter at upper limits of normal, 3.8 cm on image 23, unchanged.  Right upper lobe accessory fissure with adjacent intrapulmonary lymph node measuring 3 mm image 15 again noted.  Linear right upper lobe presumed atelectasis again noted.  Left lower lobe scarring is stable.  No new pulmonary mass or nodule.  Central airways are patent.  No acute osseous finding.  Left-sided thoracotomy defect. Cholecystectomy clips noted.  Within the upper abdomen, post surgical changes are re-identified with possible splenic remnant.  IMPRESSION: Minimal right lower lobe dependent probable compressive atelectasis associated with trace right  loculated pleural fluid.  No CT evidence for acute pulmonary embolism.   Original Report Authenticated By: Christiana Pellant, M.D.     1. Dyspnea   2. Atelectasis, right     Sat on 2L [place] O2 is 100% and I interpret to be normal  ECG at time 13:32 is SR at rate 61, first degree AV block, RBBB.  Abn ECG, no change compared to ECG from 01/19/13.   5:19 PM CT angio shows no PE, no pneumonia.  Atelectasis with small loculated fluid pocket on right base.  Pt reports she knows  she has had atelectasis in the past.  tropnin is ok.  INR is improved at 1.93.  Pt is aware.  Pt is stable to be discharged to home and can follow up with PCP.    5:32 PM Still awaiting interrogation of pacer.  On telemetry, no arrhythmias noted.   7:28 PM Medtronic rep has interrogated pacemaker, it is working fine with no recent problems.  Will d/c home.  Pt has MS Contin at home for pain.    MDM  Pt with pleurisy, not hypoxic, HR is not fast, has been subtherapeutic on INR recently.  Pt is told of dangers of recurrent CT's.  Will give benadryl and hydrocortisone for h/o allergy to contrast.  Will check labs, renal function, INR.          Gavin Pound. Oletta Lamas, MD 03/27/13 1721  Gavin Pound. Oletta Lamas, MD 03/27/13 1928

## 2013-03-27 NOTE — ED Notes (Signed)
Attempted pacemaker interrogation without success. Both interrogation devices troubleshot and again no success. Medtronic interrogator not recognizing pt's documented Medtronic device.

## 2013-03-27 NOTE — ED Notes (Signed)
CN at the bedside to interrogate pacemaker.

## 2013-03-27 NOTE — ED Notes (Signed)
The pt was discharged home before her med came up from pharmacy

## 2013-03-27 NOTE — ED Notes (Signed)
Pt reports for past couple days she has been increasingly SOB even on 2L [place]. Reports sometimes when she breathes it hurts and she thinks her BP has been high. Has felt numbness and tingling in BLE but states sometimes she has that d/t chronic back pain. A&ox4, able to speak in full sentences now

## 2013-03-27 NOTE — ED Notes (Signed)
Pt states she has had a borderline low grade fever two days ago and that she has had the chills. Pt states she nauseous for about five days. Pt denies vomiting.

## 2013-03-27 NOTE — ED Notes (Signed)
IV team at bedside 

## 2013-03-27 NOTE — ED Notes (Signed)
IV team paged for start for CT angio.

## 2013-03-27 NOTE — ED Notes (Signed)
Ms contin not given

## 2013-03-27 NOTE — ED Notes (Signed)
Pt waiting for medtronic rep to arrive to interrogate pacemaker.

## 2013-03-30 ENCOUNTER — Ambulatory Visit (INDEPENDENT_AMBULATORY_CARE_PROVIDER_SITE_OTHER): Payer: Medicaid Other

## 2013-03-30 DIAGNOSIS — I2699 Other pulmonary embolism without acute cor pulmonale: Secondary | ICD-10-CM

## 2013-03-30 DIAGNOSIS — Z7901 Long term (current) use of anticoagulants: Secondary | ICD-10-CM

## 2013-04-13 ENCOUNTER — Ambulatory Visit (INDEPENDENT_AMBULATORY_CARE_PROVIDER_SITE_OTHER): Payer: Medicaid Other

## 2013-04-13 DIAGNOSIS — Z7901 Long term (current) use of anticoagulants: Secondary | ICD-10-CM

## 2013-04-13 DIAGNOSIS — I2699 Other pulmonary embolism without acute cor pulmonale: Secondary | ICD-10-CM

## 2013-04-21 ENCOUNTER — Encounter (HOSPITAL_COMMUNITY): Payer: Self-pay | Admitting: Adult Health

## 2013-04-21 ENCOUNTER — Emergency Department (HOSPITAL_COMMUNITY): Payer: Medicaid Other

## 2013-04-21 ENCOUNTER — Emergency Department (HOSPITAL_COMMUNITY)
Admission: EM | Admit: 2013-04-21 | Discharge: 2013-04-21 | Disposition: A | Discharge status: Home / Self Care | Payer: Medicaid Other | Attending: Emergency Medicine | Admitting: Emergency Medicine

## 2013-04-21 DIAGNOSIS — Z862 Personal history of diseases of the blood and blood-forming organs and certain disorders involving the immune mechanism: Secondary | ICD-10-CM | POA: Insufficient documentation

## 2013-04-21 DIAGNOSIS — Z8739 Personal history of other diseases of the musculoskeletal system and connective tissue: Secondary | ICD-10-CM | POA: Insufficient documentation

## 2013-04-21 DIAGNOSIS — I10 Essential (primary) hypertension: Secondary | ICD-10-CM | POA: Insufficient documentation

## 2013-04-21 DIAGNOSIS — Y939 Activity, unspecified: Secondary | ICD-10-CM | POA: Insufficient documentation

## 2013-04-21 DIAGNOSIS — Z86718 Personal history of other venous thrombosis and embolism: Secondary | ICD-10-CM | POA: Insufficient documentation

## 2013-04-21 DIAGNOSIS — Z8639 Personal history of other endocrine, nutritional and metabolic disease: Secondary | ICD-10-CM | POA: Insufficient documentation

## 2013-04-21 DIAGNOSIS — F3289 Other specified depressive episodes: Secondary | ICD-10-CM | POA: Insufficient documentation

## 2013-04-21 DIAGNOSIS — X58XXXA Exposure to other specified factors, initial encounter: Secondary | ICD-10-CM | POA: Insufficient documentation

## 2013-04-21 DIAGNOSIS — Z8679 Personal history of other diseases of the circulatory system: Secondary | ICD-10-CM | POA: Insufficient documentation

## 2013-04-21 DIAGNOSIS — E669 Obesity, unspecified: Secondary | ICD-10-CM | POA: Insufficient documentation

## 2013-04-21 DIAGNOSIS — Z87448 Personal history of other diseases of urinary system: Secondary | ICD-10-CM | POA: Insufficient documentation

## 2013-04-21 DIAGNOSIS — Z79899 Other long term (current) drug therapy: Secondary | ICD-10-CM | POA: Insufficient documentation

## 2013-04-21 DIAGNOSIS — Z950 Presence of cardiac pacemaker: Secondary | ICD-10-CM | POA: Insufficient documentation

## 2013-04-21 DIAGNOSIS — Z87440 Personal history of urinary (tract) infections: Secondary | ICD-10-CM | POA: Insufficient documentation

## 2013-04-21 DIAGNOSIS — Z7901 Long term (current) use of anticoagulants: Secondary | ICD-10-CM | POA: Insufficient documentation

## 2013-04-21 DIAGNOSIS — Y929 Unspecified place or not applicable: Secondary | ICD-10-CM | POA: Insufficient documentation

## 2013-04-21 DIAGNOSIS — Z9981 Dependence on supplemental oxygen: Secondary | ICD-10-CM | POA: Insufficient documentation

## 2013-04-21 DIAGNOSIS — IMO0001 Reserved for inherently not codable concepts without codable children: Secondary | ICD-10-CM

## 2013-04-21 DIAGNOSIS — Z8719 Personal history of other diseases of the digestive system: Secondary | ICD-10-CM | POA: Insufficient documentation

## 2013-04-21 DIAGNOSIS — F411 Generalized anxiety disorder: Secondary | ICD-10-CM | POA: Insufficient documentation

## 2013-04-21 DIAGNOSIS — Z8709 Personal history of other diseases of the respiratory system: Secondary | ICD-10-CM | POA: Insufficient documentation

## 2013-04-21 DIAGNOSIS — F329 Major depressive disorder, single episode, unspecified: Secondary | ICD-10-CM | POA: Insufficient documentation

## 2013-04-21 DIAGNOSIS — S6390XA Sprain of unspecified part of unspecified wrist and hand, initial encounter: Secondary | ICD-10-CM | POA: Insufficient documentation

## 2013-04-21 DIAGNOSIS — I509 Heart failure, unspecified: Secondary | ICD-10-CM | POA: Insufficient documentation

## 2013-04-21 DIAGNOSIS — Z8669 Personal history of other diseases of the nervous system and sense organs: Secondary | ICD-10-CM | POA: Insufficient documentation

## 2013-04-21 LAB — CBC
HCT: 39.9 % (ref 36.0–46.0)
MCH: 31.5 pg (ref 26.0–34.0)
MCV: 94.5 fL (ref 78.0–100.0)
RBC: 4.22 MIL/uL (ref 3.87–5.11)
WBC: 8.2 10*3/uL (ref 4.0–10.5)

## 2013-04-21 LAB — BASIC METABOLIC PANEL
BUN: 13 mg/dL (ref 6–23)
CO2: 24 mEq/L (ref 19–32)
Calcium: 8.9 mg/dL (ref 8.4–10.5)
Chloride: 105 mEq/L (ref 96–112)
Creatinine, Ser: 0.69 mg/dL (ref 0.50–1.10)
Glucose, Bld: 138 mg/dL — ABNORMAL HIGH (ref 70–99)

## 2013-04-21 LAB — POCT I-STAT TROPONIN I

## 2013-04-21 MED ORDER — MORPHINE SULFATE 4 MG/ML IJ SOLN
8.0000 mg | Freq: Once | INTRAMUSCULAR | Status: AC
  Administered 2013-04-21: 8 mg via INTRAMUSCULAR
  Filled 2013-04-21: qty 2

## 2013-04-21 MED ORDER — CARISOPRODOL 350 MG PO TABS
350.0000 mg | ORAL_TABLET | Freq: Once | ORAL | Status: AC
  Administered 2013-04-21: 350 mg via ORAL
  Filled 2013-04-21: qty 1

## 2013-04-21 MED ORDER — OXYCODONE HCL 5 MG PO CAPS: 5.0000 mg | ORAL_CAPSULE | ORAL | Status: DC | PRN

## 2013-04-21 MED ORDER — CARISOPRODOL 350 MG PO TABS: 350.0000 mg | ORAL_TABLET | Freq: Three times a day (TID) | ORAL | Status: DC | PRN

## 2013-04-21 NOTE — ED Notes (Signed)
Presents with right sided chest pain described as sharp that began this afternoon associated with pain with inspiration and movement of right arm. Pt reports difficulty taking a deep breath due to pain. Not breathing deeply makes pain better, denies injury, denies fall, reports feeling SOB and using her home oxygen more frequently today. painis reproducable with touch.

## 2013-04-21 NOTE — ED Provider Notes (Signed)
History    CSN: [id number] Arrival date & time 04/21/13  4098  First MD Initiated Contact with Patient 04/21/13 2128     Chief Complaint  Patient presents with  . Chest Pain   (Consider location/radiation/quality/duration/timing/severity/associated sxs/prior Treatment) HPI Pt with R sided chest pain gradual onset this afternoon. Pain is worse with palpation of R pectoralis. Worse with movement of R arm. No SOB, cough, fever. No lower ext swelling or pain. Pt states she lifted several heavy objects earlier today.  Past Medical History  Diagnosis Date  . Chest pain   . Anxiety   . Depression   . UTI (urinary tract infection)   . Obesity   . HTN (hypertension)   . Obesity   . Cystitis   . Palpitations   . Lung nodules   . History of urinary tract infection   . Pulmonary embolism     Complicating gastric bypass  . Hx of blood clots     Lungs  . CHF (congestive heart failure)   . Sleep apnea     no cpap now uses 02 2 l at night  . Pacemaker     medtronic  . Sinus node dysfunction 2009     status post placement of    Medtronic pacemaker by Dr. Lewayne Bunting in 2009.   Marland Kitchen Sinoatrial node dysfunction   . Headache(784.0)   . Motility disorder, esophageal   . Arthritis   . Fibromyalgia   . Thyroid nodule    Past Surgical History  Procedure Laterality Date  . Pacemaker placement      x2, pacemaker leads replaced  . Appendectomy    . Cholecystectomy    . 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 Gastric bypass    . Stomach surgery      "2/3 of stomach removed"  . Abdominal hysterectomy    . Ectopic pregnancy surgery    . Foot surgery      both feet, and ankles  . Knee surgery      bilateral  . Rib resection    . Abdominal surgery      muscle deteriorated in abdomen  . Eus N/A 03/03/2013    Procedure: UPPER ENDOSCOPIC ULTRASOUND (EUS) LINEAR;  Surgeon: Rachael Fee, MD;  Location: [facility];  Service:  Endoscopy;  Laterality: N/A;   History reviewed. No pertinent family history. History  Substance Use Topics  . Smoking status: Never Smoker   . Smokeless tobacco: Never Used  . Alcohol Use: No   OB History   Grav Para Term Preterm Abortions TAB SAB Ect Mult Living                 Review of Systems  Constitutional: Negative for fever and chills.  HENT: Negative for neck pain and neck stiffness.   Respiratory: Negative for cough, shortness of breath and wheezing.   Cardiovascular: Positive for chest pain. Negative for palpitations and leg swelling.  Gastrointestinal: Negative for nausea, vomiting and abdominal pain.  Musculoskeletal: Positive for myalgias. Negative for back pain.  Skin: Negative for pallor, rash and wound.  Neurological: Negative for dizziness, weakness, light-headedness, numbness and headaches.  All other systems reviewed and are negative.    Allergies  Codeine; Aliskiren fumarate; Eszopiclone; Iodine; Iohexol; Labetalol hcl; Penicillins; Tetanus toxoids; Topamax; and Vancomycin  Home Medications   Current Outpatient Rx  Name  Route  Sig  Dispense  Refill  .  albuterol (PROAIR HFA) 108 (90 BASE) MCG/ACT inhaler   Inhalation   Inhale 2 puffs into the lungs every 6 (six) hours as needed for wheezing.         . clonazePAM (KLONOPIN) 1 MG tablet   Oral   Take 1 tablet (1 mg total) by mouth 2 (two) times daily.   60 tablet   0   . cyanocobalamin 1000 MCG tablet   Oral   Take 100 mcg by mouth daily.         . fluticasone (FLONASE) 50 MCG/ACT nasal spray   Nasal   Place 2 sprays into the nose daily.         Marland Kitchen lisinopril (PRINIVIL,ZESTRIL) 5 MG tablet   Oral   Take 5 mg by mouth every morning.         . methylcellulose (ARTIFICIAL TEARS) 1 % ophthalmic solution   Both Eyes   Place 2 drops into both eyes as needed (for dry eyes).          . metoprolol (LOPRESSOR) 50 MG tablet   Oral   Take 50 mg by mouth 2 (two) times daily.          Marland Kitchen  morphine (MS CONTIN) 15 MG 12 hr tablet   Oral   Take 15 mg by mouth 3 (three) times daily.          . nitrofurantoin (MACRODANTIN) 100 MG capsule   Oral   Take 100 mg by mouth every evening.         . promethazine (PHENERGAN) 25 MG tablet   Oral   Take 25 mg by mouth every 6 (six) hours as needed for nausea. For nausea and vomiting         . traZODone (DESYREL) 150 MG tablet   Oral   Take 150 mg by mouth at bedtime.         Marland Kitchen warfarin (COUMADIN) 5 MG tablet   Oral   Take 5 mg by mouth every evening. Takes 7.5mg  on Monday and Friday each week Takes 5mg  all other days         . carisoprodol (SOMA) 350 MG tablet   Oral   Take 1 tablet (350 mg total) by mouth 3 (three) times daily as needed for muscle spasms.   15 tablet   0   . oxycodone (OXY-IR) 5 MG capsule   Oral   Take 1 capsule (5 mg total) by mouth every 4 (four) hours as needed.   15 capsule   0    BP 144/90  Pulse 62  Temp(Src) 98.2 F (36.8 C) (Oral)  Resp 16  Wt 252 lb (114.306 kg)  BMI 49.22 kg/m2  SpO2 95% Physical Exam  Nursing note and vitals reviewed. Constitutional: She is oriented to person, place, and time. She appears well-developed and well-nourished. No distress.  HENT:  Head: Normocephalic and atraumatic.  Mouth/Throat: Oropharynx is clear and moist.  Eyes: EOM are normal. Pupils are equal, round, and reactive to light.  Neck: Normal range of motion. Neck supple.  Cardiovascular: Normal rate and regular rhythm.   Pulmonary/Chest: Effort normal and breath sounds normal. No respiratory distress. She has no wheezes. She has no rales. She exhibits tenderness (Chest reproduced completely with ROM of R shoulder and palpation of R pectoralis muscle. ).  Abdominal: Soft. Bowel sounds are normal. She exhibits no distension and no mass. There is no tenderness. There is no rebound and no guarding.  Musculoskeletal: Normal range  of motion. She exhibits no edema and no tenderness.  No calf  swelling or tenderness  Neurological: She is alert and oriented to person, place, and time.  5/5 motor in all ext, sensation intact  Skin: Skin is warm and dry. No rash noted. No erythema.  Psychiatric: She has a normal mood and affect. Her behavior is normal.    ED Course  Procedures (including critical care time) Labs Reviewed  BASIC METABOLIC PANEL - Abnormal; Notable for the following:    Glucose, Bld 138 (*)    All other components within normal limits  PROTIME-INR - Abnormal; Notable for the following:    Prothrombin Time 22.4 (*)    INR 2.04 (*)    All other components within normal limits  CBC  POCT I-STAT TROPONIN I   Dg Chest 2 View  04/21/2013   *RADIOLOGY REPORT*  Clinical Data: Chest pain for 1 day and cough for 2 days  CHEST - 2 VIEW  Comparison: Chest CT, 03/27/2013 and chest radiograph, 03/27/2013  Findings: There are low lung volumes.  There is mild opacity at the left lung base that is most likely atelectasis.  This is similar to the prior exam.  The lungs are otherwise clear.  No pleural effusion or pneumothorax.  The cardiac silhouette is normal in size.  No mediastinal or hilar masses.  There is a stable well-positioned left anterior chest wall sequential pacemaker.  IMPRESSION: No acute cardiopulmonary disease.  No change from the prior study.   Original Report Authenticated By: Amie Portland, M.D.   1. Muscle strain of finger, left, initial encounter      Date: 04/21/2013  Rate: 64  Rhythm: normal sinus rhythm  QRS Axis: normal  Intervals: PR prolonged  ST/T Wave abnormalities: normal  Conduction Disutrbances:first-degree A-V block  and right bundle branch block  Narrative Interpretation:   Old EKG Reviewed: unchanged    MDM  INR therapeutic. Doubt new PE. Based on exam, pain is muscluar. No acute finding in labs, EKG or chest xray. Will treat symptoms. Return precautions given.    Loren Racer, MD 04/21/13 [phone]

## 2013-04-21 NOTE — ED Notes (Signed)
MD at bedside. 

## 2013-04-25 ENCOUNTER — Encounter: Payer: Self-pay | Admitting: Internal Medicine

## 2013-04-25 ENCOUNTER — Ambulatory Visit (INDEPENDENT_AMBULATORY_CARE_PROVIDER_SITE_OTHER): Payer: Medicaid Other | Admitting: *Deleted

## 2013-04-25 DIAGNOSIS — I495 Sick sinus syndrome: Secondary | ICD-10-CM

## 2013-04-25 DIAGNOSIS — Z950 Presence of cardiac pacemaker: Secondary | ICD-10-CM

## 2013-04-26 ENCOUNTER — Telehealth: Payer: Self-pay

## 2013-04-26 ENCOUNTER — Encounter: Payer: Self-pay | Admitting: Pulmonary Disease

## 2013-04-26 LAB — REMOTE PACEMAKER DEVICE
AL AMPLITUDE: 2.8 mv
AL IMPEDENCE PM: 713 Ohm
AL THRESHOLD: 1.5 V
BATTERY VOLTAGE: 2.8 V
RV LEAD AMPLITUDE: 22.4 mv
RV LEAD THRESHOLD: 1.5 V

## 2013-04-26 NOTE — Telephone Encounter (Signed)
Diagnosis was not added to pt's problem list, but was rather used as a visit diagnosis Regardless, we are unable to remove this diagnosis from her chart She will need to contact someone in the "inpatient world" to see if they can help her mychart response sent to patient

## 2013-04-26 NOTE — Telephone Encounter (Signed)
Pt states she was in the ED at [facility] last Thursday, they checked her coumadin and it was 2.1, they put her on oxycodone, she wants to know if she should keep her appt tomorrow, or come next week. Please call

## 2013-04-27 ENCOUNTER — Ambulatory Visit (INDEPENDENT_AMBULATORY_CARE_PROVIDER_SITE_OTHER): Payer: Medicaid Other | Admitting: *Deleted

## 2013-04-27 DIAGNOSIS — I2699 Other pulmonary embolism without acute cor pulmonale: Secondary | ICD-10-CM

## 2013-04-27 DIAGNOSIS — Z7901 Long term (current) use of anticoagulants: Secondary | ICD-10-CM

## 2013-04-27 LAB — POCT INR: INR: 4

## 2013-05-04 ENCOUNTER — Ambulatory Visit (INDEPENDENT_AMBULATORY_CARE_PROVIDER_SITE_OTHER): Payer: Medicaid Other

## 2013-05-04 DIAGNOSIS — I2699 Other pulmonary embolism without acute cor pulmonale: Secondary | ICD-10-CM

## 2013-05-04 DIAGNOSIS — Z7901 Long term (current) use of anticoagulants: Secondary | ICD-10-CM

## 2013-05-04 NOTE — Telephone Encounter (Signed)
Pt seen in [facility] on 04/27/13 INR 4.0 dosage adjusted.  See anticoagulation note in EPIC.

## 2013-05-05 ENCOUNTER — Encounter: Payer: Self-pay | Admitting: *Deleted

## 2013-05-08 ENCOUNTER — Encounter (HOSPITAL_COMMUNITY): Payer: Self-pay | Admitting: *Deleted

## 2013-05-08 ENCOUNTER — Emergency Department (HOSPITAL_COMMUNITY)
Admission: EM | Admit: 2013-05-08 | Discharge: 2013-05-08 | Disposition: A | Discharge status: Home / Self Care | Payer: Medicaid Other | Source: Home / Self Care

## 2013-05-08 DIAGNOSIS — N390 Urinary tract infection, site not specified: Secondary | ICD-10-CM

## 2013-05-08 LAB — POCT URINALYSIS DIP (DEVICE)
Bilirubin Urine: NEGATIVE
Ketones, ur: NEGATIVE mg/dL
Protein, ur: NEGATIVE mg/dL
Specific Gravity, Urine: 1.02 (ref 1.005–1.030)
pH: 6 (ref 5.0–8.0)

## 2013-05-08 MED ORDER — CEPHALEXIN 500 MG PO CAPS: 500.0000 mg | ORAL_CAPSULE | Freq: Four times a day (QID) | ORAL | Status: DC

## 2013-05-08 MED ORDER — PHENAZOPYRIDINE HCL 200 MG PO TABS: 200.0000 mg | ORAL_TABLET | Freq: Three times a day (TID) | ORAL | Status: DC | PRN

## 2013-05-08 NOTE — ED Provider Notes (Signed)
History    CSN: [id number] Arrival date & time 05/08/13  1335  None    Chief Complaint  Patient presents with  . Urinary Tract Infection   (Consider location/radiation/quality/duration/timing/severity/associated sxs/prior Treatment) Patient is a 57 y.o. female presenting with urinary tract infection. The history is provided by the patient.  Urinary Tract Infection This is a recurrent (on chronic macrobid from urologist.) problem. The current episode started 2 days ago. The problem has been gradually worsening. Associated symptoms include abdominal pain.   Past Medical History  Diagnosis Date  . Chest pain   . Anxiety   . Depression   . UTI (urinary tract infection)   . Obesity   . HTN (hypertension)   . Obesity   . Cystitis   . Palpitations   . Lung nodules   . History of urinary tract infection   . Pulmonary embolism     Complicating gastric bypass  . Hx of blood clots     Lungs  . CHF (congestive heart failure)   . Sleep apnea     no cpap now uses 02 2 l at night  . Pacemaker     medtronic  . Sinus node dysfunction 2009     status post placement of    Medtronic pacemaker by Dr. Lewayne Bunting in 2009.   Marland Kitchen Sinoatrial node dysfunction   . Headache(784.0)   . Motility disorder, esophageal   . Arthritis   . Fibromyalgia   . Thyroid nodule    Past Surgical History  Procedure Laterality Date  . Pacemaker placement      x2, pacemaker leads replaced  . Appendectomy    . Cholecystectomy    . 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 Gastric bypass    . Stomach surgery      "2/3 of stomach removed"  . Abdominal hysterectomy    . Ectopic pregnancy surgery    . Foot surgery      both feet, and ankles  . Knee surgery      bilateral  . Rib resection    . Abdominal surgery      muscle deteriorated in abdomen  . Eus N/A 03/03/2013    Procedure: UPPER ENDOSCOPIC ULTRASOUND (EUS) LINEAR;  Surgeon: Rachael Fee,  MD;  Location: [facility];  Service: Endoscopy;  Laterality: N/A;   No family history on file. History  Substance Use Topics  . Smoking status: Never Smoker   . Smokeless tobacco: Never Used  . Alcohol Use: No   OB History   Grav Para Term Preterm Abortions TAB SAB Ect Mult Living                 Review of Systems  Gastrointestinal: Positive for abdominal pain.  Genitourinary: Positive for dysuria, urgency, frequency and flank pain.    Allergies  Codeine; Aliskiren fumarate; Bactrim; Eszopiclone; Iodine; Iohexol; Labetalol hcl; Penicillins; Tetanus toxoids; Topamax; and Vancomycin  Home Medications   Current Outpatient Rx  Name  Route  Sig  Dispense  Refill  . albuterol (PROAIR HFA) 108 (90 BASE) MCG/ACT inhaler   Inhalation   Inhale 2 puffs into the lungs every 6 (six) hours as needed for wheezing.         . carisoprodol (SOMA) 350 MG tablet   Oral   Take 1 tablet (350 mg total) by mouth 3 (three) times daily as needed for muscle spasms.   15 tablet  0   . clonazePAM (KLONOPIN) 1 MG tablet   Oral   Take 1 tablet (1 mg total) by mouth 2 (two) times daily.   60 tablet   0   . cyanocobalamin 1000 MCG tablet   Oral   Take 100 mcg by mouth daily.         . fluticasone (FLONASE) 50 MCG/ACT nasal spray   Nasal   Place 2 sprays into the nose daily.         Marland Kitchen lisinopril (PRINIVIL,ZESTRIL) 5 MG tablet   Oral   Take 5 mg by mouth every morning.         . methylcellulose (ARTIFICIAL TEARS) 1 % ophthalmic solution   Both Eyes   Place 2 drops into both eyes as needed (for dry eyes).          . metoprolol (LOPRESSOR) 50 MG tablet   Oral   Take 50 mg by mouth 2 (two) times daily.          Marland Kitchen morphine (MS CONTIN) 15 MG 12 hr tablet   Oral   Take 15 mg by mouth 3 (three) times daily.          . nitrofurantoin (MACRODANTIN) 100 MG capsule   Oral   Take 100 mg by mouth every evening.         Marland Kitchen oxycodone (OXY-IR) 5 MG capsule   Oral   Take 10 mg  by mouth every 4 (four) hours as needed.         . promethazine (PHENERGAN) 25 MG tablet   Oral   Take 25 mg by mouth every 6 (six) hours as needed for nausea. For nausea and vomiting         . traZODone (DESYREL) 150 MG tablet   Oral   Take 150 mg by mouth at bedtime.         Marland Kitchen warfarin (COUMADIN) 5 MG tablet   Oral   Take 5 mg by mouth every evening. Takes 7.5mg  on Monday and Friday each week Takes 5mg  all other days         . cephALEXin (KEFLEX) 500 MG capsule   Oral   Take 1 capsule (500 mg total) by mouth 4 (four) times daily. Take all of medicine and drink lots of fluids   20 capsule   0   . phenazopyridine (PYRIDIUM) 200 MG tablet   Oral   Take 1 tablet (200 mg total) by mouth 3 (three) times daily as needed for pain.   10 tablet   0    BP 98/68  Pulse 69  Temp(Src) 98.3 F (36.8 C) (Oral)  Resp 20  SpO2 97% Physical Exam  Nursing note and vitals reviewed. Constitutional: She is oriented to person, place, and time. She appears well-developed and well-nourished.  Abdominal: Soft. Bowel sounds are normal. She exhibits no distension and no mass. There is tenderness. There is no rebound and no guarding.  Neurological: She is alert and oriented to person, place, and time.  Skin: Skin is warm and dry.    ED Course  Procedures (including critical care time) Labs Reviewed  POCT URINALYSIS DIP (DEVICE) - Abnormal; Notable for the following:    Hgb urine dipstick TRACE (*)    Nitrite POSITIVE (*)    Leukocytes, UA SMALL (*)    All other components within normal limits  URINE CULTURE   No results found. 1. UTI (lower urinary tract infection)     MDM  U/a abnl  Linna Hoff, MD 05/08/13 [phone]

## 2013-05-08 NOTE — ED Notes (Signed)
C/O dysuria, frequent urination, and urinary urgency since Thurs, with worsening on Fri.

## 2013-05-09 ENCOUNTER — Telehealth: Payer: Self-pay | Admitting: Internal Medicine

## 2013-05-09 NOTE — Telephone Encounter (Signed)
Spoke with pt discussed her ABX treatment, she states that they wanted to prescribe keflex but she has GI upset with that med so she's not going to take it and she discussed this with pharmacist. I instructed pt to call prescribing MD and make them aware and if another ABX is prescribed call us back to inform us due to potential interaction. Pt verbalized understanding.

## 2013-05-09 NOTE — Telephone Encounter (Signed)
New Problem  Pt states she was seen at urgent care and given an antibiotic for a UTI. She said upon looking up the medication she is worried it will affect her coumadin. She asked if someone could give her a call back.

## 2013-05-10 LAB — URINE CULTURE: Colony Count: >=100000

## 2013-05-10 NOTE — ED Notes (Signed)
Urine culture: >100,000 colonies Klebsiella Pneumoniae. Pt. Adequately treated with Keflex. Susan Howell 05/10/2013

## 2013-05-11 ENCOUNTER — Telehealth: Payer: Self-pay | Admitting: Internal Medicine

## 2013-05-11 NOTE — Telephone Encounter (Signed)
New Prob     Would like to speak to nurse regarding an antibiotic she just started. Please call.

## 2013-05-11 NOTE — Telephone Encounter (Signed)
Keflex 500 qid for 5 days started today, UTI, macrobid in June did not work, rectocele and cystocele repair in future, takes phenergan with keflex because it makes her nauseated, she is taking with food.

## 2013-05-18 ENCOUNTER — Ambulatory Visit (INDEPENDENT_AMBULATORY_CARE_PROVIDER_SITE_OTHER): Payer: Medicaid Other

## 2013-05-18 DIAGNOSIS — Z7901 Long term (current) use of anticoagulants: Secondary | ICD-10-CM

## 2013-05-18 DIAGNOSIS — I2699 Other pulmonary embolism without acute cor pulmonale: Secondary | ICD-10-CM

## 2013-06-15 ENCOUNTER — Ambulatory Visit (INDEPENDENT_AMBULATORY_CARE_PROVIDER_SITE_OTHER): Payer: Medicaid Other | Admitting: *Deleted

## 2013-06-15 DIAGNOSIS — I2699 Other pulmonary embolism without acute cor pulmonale: Secondary | ICD-10-CM

## 2013-06-15 DIAGNOSIS — Z7901 Long term (current) use of anticoagulants: Secondary | ICD-10-CM

## 2013-07-14 ENCOUNTER — Ambulatory Visit (INDEPENDENT_AMBULATORY_CARE_PROVIDER_SITE_OTHER): Payer: Medicaid Other

## 2013-07-14 DIAGNOSIS — Z7901 Long term (current) use of anticoagulants: Secondary | ICD-10-CM

## 2013-07-14 DIAGNOSIS — I2699 Other pulmonary embolism without acute cor pulmonale: Secondary | ICD-10-CM

## 2013-07-14 LAB — POCT INR: INR: 3.5

## 2013-07-27 ENCOUNTER — Ambulatory Visit (INDEPENDENT_AMBULATORY_CARE_PROVIDER_SITE_OTHER): Payer: Medicaid Other | Admitting: General Practice

## 2013-07-27 DIAGNOSIS — Z7901 Long term (current) use of anticoagulants: Secondary | ICD-10-CM

## 2013-07-27 DIAGNOSIS — I2699 Other pulmonary embolism without acute cor pulmonale: Secondary | ICD-10-CM

## 2013-07-27 LAB — POCT INR: INR: 3.3

## 2013-08-01 ENCOUNTER — Encounter: Payer: Medicaid Other | Admitting: *Deleted

## 2013-08-02 ENCOUNTER — Encounter: Payer: Self-pay | Admitting: Cardiovascular Disease

## 2013-08-02 ENCOUNTER — Ambulatory Visit (INDEPENDENT_AMBULATORY_CARE_PROVIDER_SITE_OTHER): Payer: Medicaid Other | Admitting: Cardiovascular Disease

## 2013-08-02 VITALS — BP 110/82 | HR 56 | Ht 60.0 in | Wt 233.8 lb

## 2013-08-02 DIAGNOSIS — R600 Localized edema: Secondary | ICD-10-CM | POA: Insufficient documentation

## 2013-08-02 DIAGNOSIS — R609 Edema, unspecified: Secondary | ICD-10-CM

## 2013-08-02 DIAGNOSIS — R0602 Shortness of breath: Secondary | ICD-10-CM

## 2013-08-02 DIAGNOSIS — R079 Chest pain, unspecified: Secondary | ICD-10-CM

## 2013-08-02 NOTE — Addendum Note (Signed)
Addended by: Rhea Belton R on: 08/02/2013 04:59 PM   Modules accepted: Orders

## 2013-08-02 NOTE — Assessment & Plan Note (Signed)
She has numerous reasons for leg edema - his of PE ( and likely DVT at some point).  She has morbid obesity, is inactive,.  We will get venous duplex scan.  She had an echo in Feb - I doubt her LV function has changed.    I have asked her to avoid salt and to try to exercise as much as possible. - perhaps try water aerobics.    She will return to see Dr. Graciela Husbands in 3 months.

## 2013-08-02 NOTE — Patient Instructions (Addendum)
Your physician recommends that you schedule a follow-up appointment with Dr. Graciela Husbands in: 3 months.  Your physician has requested that you have a lower extremity venous duplex.  This test is an ultrasound of the veins in the legs or arms.  It looks at venous blood flow that carries blood from the heart to the legs or arms.  Allow one hour for a Lower Venous exam.   There are no restrictions or special instructions.
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
## 2013-08-03 ENCOUNTER — Encounter (INDEPENDENT_AMBULATORY_CARE_PROVIDER_SITE_OTHER): Payer: Medicaid Other

## 2013-08-03 DIAGNOSIS — R079 Chest pain, unspecified: Secondary | ICD-10-CM

## 2013-08-03 DIAGNOSIS — R609 Edema, unspecified: Secondary | ICD-10-CM

## 2013-08-03 DIAGNOSIS — R0602 Shortness of breath: Secondary | ICD-10-CM

## 2013-08-03 DIAGNOSIS — R600 Localized edema: Secondary | ICD-10-CM

## 2013-08-08 ENCOUNTER — Encounter: Payer: Self-pay | Admitting: *Deleted

## 2013-08-09 ENCOUNTER — Telehealth: Payer: Self-pay

## 2013-08-09 NOTE — Telephone Encounter (Signed)
Message copied by Marilynne Halsted on Tue Aug 09, 2013 10:46 AM ------      Message from: Antony Odea      Created: Fri Aug 05, 2013  5:30 PM                   ----- Message -----         From: Vesta Mixer, MD         Sent: 08/04/2013   9:57 PM           To: Antony Odea, RN            No evidence of DVT       ------

## 2013-08-09 NOTE — Telephone Encounter (Signed)
Spoke w/ pt.  She is aware of results.  

## 2013-08-15 ENCOUNTER — Encounter: Payer: Self-pay | Admitting: *Deleted

## 2013-08-17 ENCOUNTER — Ambulatory Visit (INDEPENDENT_AMBULATORY_CARE_PROVIDER_SITE_OTHER): Payer: Medicaid Other

## 2013-08-17 ENCOUNTER — Ambulatory Visit (INDEPENDENT_AMBULATORY_CARE_PROVIDER_SITE_OTHER): Payer: Medicaid Other | Admitting: General Practice

## 2013-08-17 DIAGNOSIS — Z7901 Long term (current) use of anticoagulants: Secondary | ICD-10-CM

## 2013-08-17 DIAGNOSIS — I2699 Other pulmonary embolism without acute cor pulmonale: Secondary | ICD-10-CM

## 2013-08-17 LAB — PROTIME-INR: INR: 5.6 — AB (ref <=1.1)

## 2013-08-25 ENCOUNTER — Ambulatory Visit (INDEPENDENT_AMBULATORY_CARE_PROVIDER_SITE_OTHER): Payer: Medicaid Other | Admitting: General Practice

## 2013-08-25 DIAGNOSIS — I2699 Other pulmonary embolism without acute cor pulmonale: Secondary | ICD-10-CM

## 2013-08-25 DIAGNOSIS — Z7901 Long term (current) use of anticoagulants: Secondary | ICD-10-CM

## 2013-08-25 LAB — POCT INR: INR: 3.6

## 2013-09-01 ENCOUNTER — Other Ambulatory Visit: Payer: Self-pay

## 2013-09-02 ENCOUNTER — Emergency Department (HOSPITAL_COMMUNITY)
Admission: EM | Admit: 2013-09-02 | Discharge: 2013-09-02 | Disposition: A | Discharge status: Home / Self Care | Payer: Medicaid Other | Attending: Emergency Medicine | Admitting: Emergency Medicine

## 2013-09-02 ENCOUNTER — Encounter (HOSPITAL_COMMUNITY): Payer: Self-pay | Admitting: Emergency Medicine

## 2013-09-02 ENCOUNTER — Emergency Department (HOSPITAL_COMMUNITY): Payer: Medicaid Other

## 2013-09-02 DIAGNOSIS — F329 Major depressive disorder, single episode, unspecified: Secondary | ICD-10-CM | POA: Insufficient documentation

## 2013-09-02 DIAGNOSIS — R110 Nausea: Secondary | ICD-10-CM | POA: Insufficient documentation

## 2013-09-02 DIAGNOSIS — I10 Essential (primary) hypertension: Secondary | ICD-10-CM | POA: Insufficient documentation

## 2013-09-02 DIAGNOSIS — R002 Palpitations: Secondary | ICD-10-CM | POA: Insufficient documentation

## 2013-09-02 DIAGNOSIS — Z87448 Personal history of other diseases of urinary system: Secondary | ICD-10-CM | POA: Insufficient documentation

## 2013-09-02 DIAGNOSIS — R42 Dizziness and giddiness: Secondary | ICD-10-CM | POA: Diagnosis present

## 2013-09-02 DIAGNOSIS — Z86711 Personal history of pulmonary embolism: Secondary | ICD-10-CM | POA: Insufficient documentation

## 2013-09-02 DIAGNOSIS — F3289 Other specified depressive episodes: Secondary | ICD-10-CM | POA: Insufficient documentation

## 2013-09-02 DIAGNOSIS — Z8719 Personal history of other diseases of the digestive system: Secondary | ICD-10-CM | POA: Insufficient documentation

## 2013-09-02 DIAGNOSIS — IMO0002 Reserved for concepts with insufficient information to code with codable children: Secondary | ICD-10-CM | POA: Insufficient documentation

## 2013-09-02 DIAGNOSIS — I509 Heart failure, unspecified: Secondary | ICD-10-CM | POA: Insufficient documentation

## 2013-09-02 DIAGNOSIS — IMO0001 Reserved for inherently not codable concepts without codable children: Secondary | ICD-10-CM | POA: Insufficient documentation

## 2013-09-02 DIAGNOSIS — G8929 Other chronic pain: Secondary | ICD-10-CM | POA: Insufficient documentation

## 2013-09-02 DIAGNOSIS — M549 Dorsalgia, unspecified: Secondary | ICD-10-CM | POA: Insufficient documentation

## 2013-09-02 DIAGNOSIS — F411 Generalized anxiety disorder: Secondary | ICD-10-CM | POA: Insufficient documentation

## 2013-09-02 DIAGNOSIS — E669 Obesity, unspecified: Secondary | ICD-10-CM | POA: Insufficient documentation

## 2013-09-02 DIAGNOSIS — J029 Acute pharyngitis, unspecified: Secondary | ICD-10-CM | POA: Insufficient documentation

## 2013-09-02 DIAGNOSIS — Z8739 Personal history of other diseases of the musculoskeletal system and connective tissue: Secondary | ICD-10-CM | POA: Insufficient documentation

## 2013-09-02 DIAGNOSIS — Z880 Allergy status to penicillin: Secondary | ICD-10-CM | POA: Insufficient documentation

## 2013-09-02 DIAGNOSIS — Z87440 Personal history of urinary (tract) infections: Secondary | ICD-10-CM | POA: Insufficient documentation

## 2013-09-02 DIAGNOSIS — Z950 Presence of cardiac pacemaker: Secondary | ICD-10-CM | POA: Insufficient documentation

## 2013-09-02 LAB — URINALYSIS, ROUTINE W REFLEX MICROSCOPIC
Glucose, UA: 100 mg/dL — AB
Leukocytes, UA: NEGATIVE
Protein, ur: NEGATIVE mg/dL
Specific Gravity, Urine: 1.03 (ref 1.005–1.030)
pH: 5.5 (ref 5.0–8.0)

## 2013-09-02 LAB — CBC WITH DIFFERENTIAL/PLATELET
Basophils Absolute: 0 10*3/uL (ref 0.0–0.1)
Basophils Relative: 0 % (ref 0–1)
Hemoglobin: 13.5 g/dL (ref 12.0–15.0)
Lymphocytes Relative: 49 % — ABNORMAL HIGH (ref 12–46)
MCHC: 33.3 g/dL (ref 30.0–36.0)
Neutro Abs: 3.2 10*3/uL (ref 1.7–7.7)
Neutrophils Relative %: 37 % — ABNORMAL LOW (ref 43–77)
RDW: 16.2 % — ABNORMAL HIGH (ref 11.5–15.5)
WBC: 8.7 10*3/uL (ref 4.0–10.5)

## 2013-09-02 LAB — COMPREHENSIVE METABOLIC PANEL
ALT: 9 U/L (ref 0–35)
AST: 17 U/L (ref 0–37)
Albumin: 3.6 g/dL (ref 3.5–5.2)
Alkaline Phosphatase: 71 U/L (ref 39–117)
Chloride: 107 mEq/L (ref 96–112)
Potassium: 3.6 mEq/L (ref 3.5–5.1)
Total Bilirubin: 0.5 mg/dL (ref 0.3–1.2)

## 2013-09-02 MED ORDER — MECLIZINE HCL 50 MG PO TABS: 25.0000 mg | ORAL_TABLET | Freq: Three times a day (TID) | ORAL | Status: DC | PRN

## 2013-09-02 MED ORDER — PROMETHAZINE HCL 25 MG PO TABS: 25.0000 mg | ORAL_TABLET | Freq: Four times a day (QID) | ORAL | Status: DC | PRN

## 2013-09-02 NOTE — ED Provider Notes (Signed)
CSN: [id number]     Arrival date & time 09/02/13  1258 History   First MD Initiated Contact with Patient 09/02/13 1325     Chief Complaint  Patient presents with  . Near Syncope   (Consider location/radiation/quality/duration/timing/severity/associated sxs/prior Treatment) Patient is a 57 y.o. female presenting with neurologic complaint. The history is provided by the patient.  Neurologic Problem This is a new problem. Episode onset: 3 days ago. Episode frequency: intermittently. The problem has been gradually improving. Associated symptoms include shortness of breath. Pertinent negatives include no chest pain, no abdominal pain and no headaches. Exacerbated by: standing up, moving her head. The symptoms are relieved by rest. She has tried nothing for the symptoms. The treatment provided no relief.    Past Medical History  Diagnosis Date  . Chest pain   . Anxiety   . Depression   . UTI (urinary tract infection)   . Obesity   . HTN (hypertension)   . Obesity   . Cystitis   . Palpitations   . Lung nodules   . History of urinary tract infection   . Pulmonary embolism     Complicating gastric bypass  . Hx of blood clots     Lungs  . CHF (congestive heart failure)   . Sleep apnea     no cpap now uses 02 2 l at night  . Pacemaker     medtronic  . Sinus node dysfunction 2009     status post placement of    Medtronic pacemaker by Dr. Lewayne Bunting in 2009.   Marland Kitchen Sinoatrial node dysfunction   . Headache(784.0)   . Motility disorder, esophageal   . Arthritis   . Fibromyalgia   . Thyroid nodule    Past Surgical History  Procedure Laterality Date  . Pacemaker placement      x2, pacemaker leads replaced  . Appendectomy    . Cholecystectomy    . 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 Gastric bypass    . Stomach surgery      "2/3 of stomach removed"  . Abdominal hysterectomy    . Ectopic pregnancy surgery    . Foot surgery       both feet, and ankles  . Knee surgery      bilateral  . Rib resection    . Abdominal surgery      muscle deteriorated in abdomen  . Eus N/A 03/03/2013    Procedure: UPPER ENDOSCOPIC ULTRASOUND (EUS) LINEAR;  Surgeon: Rachael Fee, MD;  Location: [facility];  Service: Endoscopy;  Laterality: N/A;   History reviewed. No pertinent family history. History  Substance Use Topics  . Smoking status: Never Smoker   . Smokeless tobacco: Never Used  . Alcohol Use: No   OB History   Grav Para Term Preterm Abortions TAB SAB Ect Mult Living                 Review of Systems  Constitutional: Positive for appetite change (dec po intake since her husband died in 07/08/2023). Negative for fever and fatigue.  HENT: Positive for congestion and sore throat. Negative for drooling.   Eyes: Negative for pain.  Respiratory: Positive for shortness of breath. Negative for cough.   Cardiovascular: Negative for chest pain.  Gastrointestinal: Positive for nausea. Negative for vomiting, abdominal pain and diarrhea.  Genitourinary: Negative for dysuria and hematuria.  Musculoskeletal: Positive for back pain (chronic and  unchanged). Negative for gait problem and neck pain.  Skin: Negative for color change.  Neurological: Positive for light-headedness. Negative for dizziness and headaches.  Hematological: Negative for adenopathy.  Psychiatric/Behavioral: Negative for behavioral problems.  All other systems reviewed and are negative.    Allergies  Codeine; Aliskiren fumarate; Bactrim; Eszopiclone; Iodine; Iohexol; Labetalol hcl; Penicillins; Tetanus toxoids; Topamax; and Vancomycin  Home Medications   Current Outpatient Rx  Name  Route  Sig  Dispense  Refill  . albuterol (PROAIR HFA) 108 (90 BASE) MCG/ACT inhaler   Inhalation   Inhale 2 puffs into the lungs every 6 (six) hours as needed for wheezing.         . carisoprodol (SOMA) 350 MG tablet   Oral   Take 1 tablet (350 mg total) by mouth 3  (three) times daily as needed for muscle spasms.   15 tablet   0   . clonazePAM (KLONOPIN) 1 MG tablet   Oral   Take 1 tablet (1 mg total) by mouth 2 (two) times daily.   60 tablet   0   . cyanocobalamin 1000 MCG tablet   Oral   Take 100 mcg by mouth daily.         . fluticasone (FLONASE) 50 MCG/ACT nasal spray   Nasal   Place 2 sprays into the nose daily.         Marland Kitchen lisinopril (PRINIVIL,ZESTRIL) 10 MG tablet   Oral   Take 5 mg by mouth as needed.         . methylcellulose (ARTIFICIAL TEARS) 1 % ophthalmic solution   Both Eyes   Place 2 drops into both eyes as needed (for dry eyes).          . metoprolol (LOPRESSOR) 50 MG tablet   Oral   Take 50 mg by mouth 2 (two) times daily.          Marland Kitchen morphine (MS CONTIN) 15 MG 12 hr tablet   Oral   Take 15 mg by mouth 3 (three) times daily.          . nitrofurantoin (MACRODANTIN) 100 MG capsule   Oral   Take 100 mg by mouth every evening.         Marland Kitchen oxycodone (OXY-IR) 5 MG capsule   Oral   Take 10 mg by mouth every 4 (four) hours as needed.         . phenazopyridine (PYRIDIUM) 200 MG tablet   Oral   Take 1 tablet (200 mg total) by mouth 3 (three) times daily as needed for pain.   10 tablet   0   . promethazine (PHENERGAN) 25 MG tablet   Oral   Take 25 mg by mouth every 6 (six) hours as needed for nausea. For nausea and vomiting         . traZODone (DESYREL) 150 MG tablet   Oral   Take 150 mg by mouth at bedtime.         Marland Kitchen warfarin (COUMADIN) 5 MG tablet   Oral   Take 5 mg by mouth every evening. Takes 7.5mg  on Monday and Friday each week Takes 5mg  all other days          BP 127/64  Pulse 63  Temp(Src) 98 F (36.7 C)  Resp 18  SpO2 97% Physical Exam  Nursing note and vitals reviewed. Constitutional: She is oriented to person, place, and time. She appears well-developed and well-nourished.  HENT:  Head: Normocephalic.  Mouth/Throat:  No oropharyngeal exudate.  Eyes: Conjunctivae and EOM  are normal. Pupils are equal, round, and reactive to light.  Neck: Normal range of motion. Neck supple.  Cardiovascular: Normal rate, regular rhythm, normal heart sounds and intact distal pulses.  Exam reveals no gallop and no friction rub.   No murmur heard. Pulmonary/Chest: Effort normal and breath sounds normal. No respiratory distress. She has no wheezes.  Abdominal: Soft. Bowel sounds are normal. There is no tenderness. There is no rebound and no guarding.  Musculoskeletal: Normal range of motion. She exhibits no edema and no tenderness.  Neurological: She is alert and oriented to person, place, and time. She has normal strength. No cranial nerve deficit or sensory deficit. She displays a negative Romberg sign. Coordination and gait normal.  Normal finger to nose bilaterally.  No nystagmus noted on exam.  The patient ambulate forwards and backwards easily.  Mild reproduction of lightheadedness with standing.  Skin: Skin is warm and dry.  Psychiatric: She has a normal mood and affect. Her behavior is normal.    ED Course  Procedures (including critical care time) Labs Review Labs Reviewed  CBC WITH DIFFERENTIAL - Abnormal; Notable for the following:    RDW 16.2 (*)    Neutrophils Relative % 37 (*)    Lymphocytes Relative 49 (*)    Lymphs Abs 4.3 (*)    Monocytes Absolute 1.1 (*)    All other components within normal limits  COMPREHENSIVE METABOLIC PANEL - Abnormal; Notable for the following:    Sodium 146 (*)    Glucose, Bld 102 (*)    GFR calc non Af Amer 67 (*)    GFR calc Af Amer 78 (*)    All other components within normal limits  PROTIME-INR - Abnormal; Notable for the following:    Prothrombin Time 26.7 (*)    INR 2.57 (*)    All other components within normal limits  URINALYSIS, ROUTINE W REFLEX MICROSCOPIC - Abnormal; Notable for the following:    Glucose, UA 100 (*)    Ketones, ur 15 (*)    All other components within normal limits  TROPONIN I   Imaging  Review Dg Chest 2 View  09/02/2013   CLINICAL DATA:  Palpitations with dizziness and shortness of breath.  EXAM: CHEST  2 VIEW  COMPARISON:  04/21/2013  FINDINGS: Lung volumes are low, as before. The cardio pericardial silhouette is enlarged. There is pulmonary vascular congestion without overt pulmonary edema. Interstitial markings are diffusely coarsened with chronic features. No edema or focal airspace consolidation. Patient is status post thoracolumbar fusion, incompletely visualized hardware. Stable appearance of accentuated throughout the lumbar kyphosis. Left dual lead permanent pacemaker remains in place. Bones are diffusely demineralized.  IMPRESSION: Stable. No acute cardiopulmonary process.   Electronically Signed   By: Kennith Center M.D.   On: 09/02/2013 14:44    EKG Interpretation     Ventricular Rate:  60 PR Interval:  256 QRS Duration: 143 QT Interval:  457 QTC Calculation: 457 R Axis:   68 Text Interpretation:  Sinus rhythm Prolonged PR interval - 1st degree AV block Right bundle branch block No significant change since last tracing            MDM   1. Lightheadedness   2. Palpitations    2:21 PM 57 y.o. female presents with intermittent lightheadedness with head movement for the last 3 days. She also notes intermittent palpitations, chills, and mild nausea. She denies any fevers, chest pain, and  has mild shortness of breath at baseline which is unchanged. She states that during the episodes of lightheadedness she will occasionally sees floaters. She notes that she has had a URI with congestion and mild sore throat for the last 3-4 weeks. She is afebrile and vital signs are unremarkable here. She is neurologically intact on exam and ambulates with ease. Low suspicion for stroke, I suspect her symptoms are more likely associated with a post viral labyrinthitis which she has had in the past. Will get screening labwork.   I interpreted/reviewed the labs and/or imaging  which were non-contributory.  I have discussed the diagnosis/risks/treatment options with the patient and believe the pt to be eligible for discharge home to follow-up with pcp as needed. We also discussed returning to the ED immediately if new or worsening sx occur. We discussed the sx which are most concerning (e.g., worsening dizziness) that necessitate immediate return. Any new prescriptions provided to the patient are listed below.  Discharge Medication List as of 09/02/2013  4:33 PM    START taking these medications   Details  meclizine (ANTIVERT) 50 MG tablet Take 0.5 tablets (25 mg total) by mouth 3 (three) times daily as needed., Starting 09/02/2013, Until Discontinued, Print    !! promethazine (PHENERGAN) 25 MG tablet Take 1 tablet (25 mg total) by mouth every 6 (six) hours as needed for nausea or vomiting., Starting 09/02/2013, Until Discontinued, Print     !! - Potential duplicate medications found. Please discuss with provider.         Junius Argyle, MD 09/03/13 1214

## 2013-09-02 NOTE — ED Notes (Signed)
Per pt sts that when she stands or turn really fast she is seeing floaters and sometimes feel like she will pass out. Denies room spinning. sts some nausea and diarrhea.

## 2013-09-07 ENCOUNTER — Telehealth: Payer: Self-pay

## 2013-09-07 ENCOUNTER — Ambulatory Visit (INDEPENDENT_AMBULATORY_CARE_PROVIDER_SITE_OTHER): Payer: Medicaid Other | Admitting: Cardiovascular Disease

## 2013-09-07 DIAGNOSIS — Z7901 Long term (current) use of anticoagulants: Secondary | ICD-10-CM

## 2013-09-07 NOTE — Telephone Encounter (Signed)
Pt states she was in [place] and got her coumadin checked and it was 2.6. She had to cancel her appt for today. And wants to know if her dosage will change. Please call and advise.

## 2013-09-07 NOTE — Telephone Encounter (Signed)
Spoke with pt, see Anti-coag encounter.

## 2013-09-12 ENCOUNTER — Telehealth: Payer: Self-pay | Admitting: Internal Medicine

## 2013-09-12 DIAGNOSIS — K862 Cyst of pancreas: Secondary | ICD-10-CM

## 2013-09-12 NOTE — Telephone Encounter (Signed)
Patty do you know anything about this? 

## 2013-09-12 NOTE — Telephone Encounter (Signed)
Pt has a reminder to set her up or a CT pancreatic protocol.  Pt has IV dye as an allergy, will need premed prednisone 50 mg PO at 13 hours, 7 hours, and 1 hour prior to the study and benadryl 50 mg PO at 1 hour prior to the study on 09/20/13 arrive 1000 NPO 4 hours [place] CT  Left message on machine to call back will need to send prednisone

## 2013-09-13 NOTE — Telephone Encounter (Signed)
Left message on machine to call back  

## 2013-09-15 MED ORDER — PREDNISONE 50 MG PO TABS: ORAL_TABLET | ORAL | Status: DC

## 2013-09-15 NOTE — Telephone Encounter (Signed)
Pt has been notified and instructed. She will call with any questions or concerns

## 2013-09-20 ENCOUNTER — Ambulatory Visit (INDEPENDENT_AMBULATORY_CARE_PROVIDER_SITE_OTHER)
Admission: RE | Admit: 2013-09-20 | Discharge: 2013-09-20 | Disposition: A | Discharge status: Home / Self Care | Payer: Medicaid Other | Source: Ambulatory Visit | Attending: Gastroenterology | Admitting: Gastroenterology

## 2013-09-20 DIAGNOSIS — K862 Cyst of pancreas: Secondary | ICD-10-CM

## 2013-09-20 MED ORDER — IOHEXOL 350 MG/ML SOLN
100.0000 mL | Freq: Once | INTRAVENOUS | Status: AC | PRN
  Administered 2013-09-20: 100 mL via INTRAVENOUS

## 2013-09-21 ENCOUNTER — Ambulatory Visit (INDEPENDENT_AMBULATORY_CARE_PROVIDER_SITE_OTHER): Payer: Medicaid Other

## 2013-09-21 DIAGNOSIS — Z7901 Long term (current) use of anticoagulants: Secondary | ICD-10-CM

## 2013-09-21 DIAGNOSIS — I2699 Other pulmonary embolism without acute cor pulmonale: Secondary | ICD-10-CM

## 2013-09-21 LAB — POCT INR: INR: 3.8

## 2013-09-26 ENCOUNTER — Telehealth: Payer: Self-pay | Admitting: Internal Medicine

## 2013-09-27 NOTE — Telephone Encounter (Signed)
Pt has been scheduled for a follow up appt. 

## 2013-10-10 ENCOUNTER — Ambulatory Visit (INDEPENDENT_AMBULATORY_CARE_PROVIDER_SITE_OTHER): Payer: Medicaid Other

## 2013-10-10 DIAGNOSIS — I2699 Other pulmonary embolism without acute cor pulmonale: Secondary | ICD-10-CM

## 2013-10-10 DIAGNOSIS — Z7901 Long term (current) use of anticoagulants: Secondary | ICD-10-CM
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
## 2013-10-24 ENCOUNTER — Ambulatory Visit: Payer: Medicaid Other | Admitting: Gastroenterology

## 2013-10-26 ENCOUNTER — Telehealth: Payer: Self-pay

## 2013-10-26 ENCOUNTER — Ambulatory Visit (INDEPENDENT_AMBULATORY_CARE_PROVIDER_SITE_OTHER): Payer: Medicaid Other

## 2013-10-26 DIAGNOSIS — I2699 Other pulmonary embolism without acute cor pulmonale: Secondary | ICD-10-CM

## 2013-10-26 DIAGNOSIS — Z7901 Long term (current) use of anticoagulants: Secondary | ICD-10-CM

## 2013-10-26 LAB — POCT INR: INR: 1.3

## 2013-10-26 NOTE — Telephone Encounter (Signed)
Dr [name] pt would like a personal call from you on her home number first and then try her cell if no answer.  Not sure what it is about Susan Howell gave me the message.

## 2013-10-28 ENCOUNTER — Telehealth: Payer: Self-pay | Admitting: *Deleted

## 2013-10-28 DIAGNOSIS — J984 Other disorders of lung: Secondary | ICD-10-CM

## 2013-10-28 NOTE — Telephone Encounter (Signed)
LMTCB

## 2013-10-28 NOTE — Telephone Encounter (Signed)
We spoke about several things on phone this AM.    I recommended repeat CT scan pancreatic protocol in May 2015 (to follow the peripancreatic lesion, cyst noted previously).  Please put that in our reminder system. She also mentioned trouble she was having with swallowing which has been worked up at [facility] this past year with barium testing, EGD.  I asked her to bring all those records for review at her upcoming February appt. [facility] [place]

## 2013-10-31 NOTE — Telephone Encounter (Signed)
Recall in epic for CT

## 2013-10-31 NOTE — Telephone Encounter (Signed)
Spoke with the pt and reminded her that ct chest due Feb 2015 to f.u SNP  She verbalized understanding and denied any questions  [facility]

## 2013-11-01 ENCOUNTER — Ambulatory Visit (INDEPENDENT_AMBULATORY_CARE_PROVIDER_SITE_OTHER): Payer: Medicaid Other | Admitting: Pharmacist

## 2013-11-01 ENCOUNTER — Ambulatory Visit (INDEPENDENT_AMBULATORY_CARE_PROVIDER_SITE_OTHER): Payer: Medicaid Other

## 2013-11-01 ENCOUNTER — Ambulatory Visit (INDEPENDENT_AMBULATORY_CARE_PROVIDER_SITE_OTHER): Payer: Medicaid Other | Admitting: Internal Medicine

## 2013-11-01 ENCOUNTER — Encounter: Payer: Self-pay | Admitting: Internal Medicine

## 2013-11-01 VITALS — BP 146/97 | HR 59 | Ht 60.0 in | Wt 230.8 lb

## 2013-11-01 DIAGNOSIS — Z7901 Long term (current) use of anticoagulants: Secondary | ICD-10-CM

## 2013-11-01 DIAGNOSIS — I495 Sick sinus syndrome: Secondary | ICD-10-CM

## 2013-11-01 DIAGNOSIS — R002 Palpitations: Secondary | ICD-10-CM

## 2013-11-01 DIAGNOSIS — Z950 Presence of cardiac pacemaker: Secondary | ICD-10-CM

## 2013-11-01 DIAGNOSIS — I2699 Other pulmonary embolism without acute cor pulmonale: Secondary | ICD-10-CM

## 2013-11-01 LAB — MDC_IDC_ENUM_SESS_TYPE_INCLINIC
Battery Remaining Longevity: 125 mo
Battery Voltage: 2.8 V
Brady Statistic AP VP Percent: 0 %
Lead Channel Impedance Value: 652 Ohm
Lead Channel Sensing Intrinsic Amplitude: 1.4 mV
Lead Channel Sensing Intrinsic Amplitude: 11.2 mV
Lead Channel Setting Pacing Amplitude: 2 V
Lead Channel Setting Pacing Pulse Width: 1 ms
MDC IDC MSMT BATTERY IMPEDANCE: 259 Ohm
MDC IDC MSMT LEADCHNL RA PACING THRESHOLD AMPLITUDE: 1 V
MDC IDC MSMT LEADCHNL RA PACING THRESHOLD PULSEWIDTH: 0.4 ms
MDC IDC MSMT LEADCHNL RV IMPEDANCE VALUE: 661 Ohm
MDC IDC MSMT LEADCHNL RV PACING THRESHOLD AMPLITUDE: 0.75 V
MDC IDC MSMT LEADCHNL RV PACING THRESHOLD PULSEWIDTH: 1 ms
MDC IDC SESS DTM: 20150106103543
MDC IDC SET LEADCHNL RV PACING AMPLITUDE: 2.5 V
MDC IDC SET LEADCHNL RV SENSING SENSITIVITY: 4 mV
MDC IDC STAT BRADY AP VS PERCENT: 9 %
MDC IDC STAT BRADY AS VP PERCENT: 0 %
MDC IDC STAT BRADY AS VS PERCENT: 91 %

## 2013-11-01 LAB — POCT INR: INR: 1.4

## 2013-11-01 NOTE — Patient Instructions (Signed)
Remote monitoring is used to monitor your Pacemaker of ICD from home. This monitoring reduces the number of office visits required to check your device to one time per year. It allows us to keep an eye on the functioning of your device to ensure it is working properly. You are scheduled for a device check from home on 02/02/14. You may send your transmission at any time that day. If you have a wireless device, the transmission will be sent automatically. After your physician reviews your transmission, you will receive a postcard with your next transmission date.  Your physician wants you to follow-up in: 1 year You will receive a reminder letter in the mail two months in advance. If you don't receive a letter, please call our office to schedule the follow-up appointment.  
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
## 2013-11-01 NOTE — Assessment & Plan Note (Signed)
The patient's device was interrogated.  The information was reviewed. No changes were made in the programming.    

## 2013-11-01 NOTE — Assessment & Plan Note (Signed)
Stable post device 

## 2013-11-03 ENCOUNTER — Telehealth: Payer: Self-pay | Admitting: Gastroenterology

## 2013-11-04 NOTE — Telephone Encounter (Signed)
Pt was advised that the message was an old voice mail and I have not called since she thanked me for calling

## 2013-11-09 ENCOUNTER — Telehealth: Payer: Self-pay | Admitting: Pulmonary Disease

## 2013-11-09 DIAGNOSIS — J984 Other disorders of lung: Secondary | ICD-10-CM

## 2013-11-09 NOTE — Telephone Encounter (Signed)
[id number] x1 

## 2013-11-10 ENCOUNTER — Ambulatory Visit (INDEPENDENT_AMBULATORY_CARE_PROVIDER_SITE_OTHER): Payer: Medicaid Other

## 2013-11-10 DIAGNOSIS — Z7901 Long term (current) use of anticoagulants: Secondary | ICD-10-CM

## 2013-11-10 LAB — POCT INR: INR: 2

## 2013-11-10 NOTE — Telephone Encounter (Signed)
Pt states she is allergic to IV dye and has an upcoming CT scan schedule with contrast ordered by Dr. Kendrick FriesMcquaid. Pt is needing rx sent for pretreatment for this allergy. Pt is requesting prednisone and benadryl. Please advise. Carron CurieJennifer Adriaan Maltese, CMA Allergies  Allergen Reactions  . Codeine Itching  . Aliskiren Fumarate     REACTION: ANXIETY // SYNCOPE  . Bactrim [Sulfamethoxazole-Trimethoprim]     R/T Coumadin  . Eszopiclone     REACTION: NIGHT MARES  . Iodine Other (See Comments)    seizures  . Iohexol      Desc: NAUSEA,VOMITING & SZ S/P CONTRAST INJ 30 YRS AGO// OK W/ PRE MEDS, BENADRYL & SOLUMEDROL TODAY//A.C., Onset Date: [id number]   . Labetalol Hcl Hypertension  . Penicillins Nausea And Vomiting    Cannot take tablets by mouth; IV is fine.   . Tetanus Toxoids Swelling    Fever, flushing and hardening of the injection site  . Topamax Other (See Comments)    Confusion, high blood pressure.   . Vancomycin Nausea Only    Severe chills

## 2013-11-10 NOTE — Telephone Encounter (Signed)
This should be a non-contrast study for pulmonary nodule

## 2013-11-10 NOTE — Telephone Encounter (Signed)
Pt states that she did NOT speak w/ anyone this morning which is why she was calling back.  Pt aware that we are forwarding this info to Dr. Kendrick FriesMcQuaid & that she will get a call back once we have heard back from him.  Pt verbalized understanding.  Antionette FairyHolly D Pryor

## 2013-11-10 NOTE — Telephone Encounter (Signed)
Not by triage Will forward back to BQ

## 2013-11-10 NOTE — Telephone Encounter (Signed)
[email or web address] x1 

## 2013-11-11 NOTE — Telephone Encounter (Signed)
Returning call can be reached at [phone].Susan EvertsJuanita S Davis

## 2013-11-11 NOTE — Telephone Encounter (Signed)
Pt called back. I made her aware we will do CT non contrast. She is already scheduled to have this done 12/16/13 at [facility]. I have placed this order in EPIC so this can be sent over. Please advise PCC's thanks

## 2013-11-11 NOTE — Telephone Encounter (Signed)
Spoke w/Heather @[facility] and changed to CT Chest w/o contrast

## 2013-11-11 NOTE — Telephone Encounter (Signed)
[email or web address].Hermelinda Diegel, CMA  

## 2013-11-22 ENCOUNTER — Ambulatory Visit: Payer: Medicaid Other | Admitting: Gastroenterology

## 2013-11-29 ENCOUNTER — Ambulatory Visit (INDEPENDENT_AMBULATORY_CARE_PROVIDER_SITE_OTHER): Payer: Medicaid Other | Admitting: Gastroenterology

## 2013-11-29 ENCOUNTER — Other Ambulatory Visit (INDEPENDENT_AMBULATORY_CARE_PROVIDER_SITE_OTHER): Payer: Medicaid Other

## 2013-11-29 ENCOUNTER — Telehealth: Payer: Self-pay | Admitting: Gastroenterology

## 2013-11-29 ENCOUNTER — Encounter: Payer: Self-pay | Admitting: Gastroenterology

## 2013-11-29 VITALS — BP 122/84 | HR 64 | Ht 60.0 in | Wt 209.0 lb

## 2013-11-29 DIAGNOSIS — Q453 Other congenital malformations of pancreas and pancreatic duct: Secondary | ICD-10-CM

## 2013-11-29 LAB — COMPREHENSIVE METABOLIC PANEL
ALBUMIN: 3.7 g/dL (ref 3.5–5.2)
ALK PHOS: 53 U/L (ref 39–117)
ALT: 14 U/L (ref 0–35)
AST: 16 U/L (ref 0–37)
BUN: 9 mg/dL (ref 6–23)
CO2: 31 mEq/L (ref 19–32)
Calcium: 9.2 mg/dL (ref 8.4–10.5)
Chloride: 101 mEq/L (ref 96–112)
Creatinine, Ser: 0.7 mg/dL (ref 0.4–1.2)
GFR: 89.76 mL/min (ref >60.00)
Glucose, Bld: 105 mg/dL — ABNORMAL HIGH (ref 70–99)
POTASSIUM: 3.5 meq/L (ref 3.5–5.1)
SODIUM: 141 meq/L (ref 135–145)
TOTAL PROTEIN: 7.9 g/dL (ref 6.0–8.3)
Total Bilirubin: 0.9 mg/dL (ref 0.3–1.2)

## 2013-11-29 LAB — CBC WITH DIFFERENTIAL/PLATELET
BASOS ABS: 0 10*3/uL (ref 0.0–0.1)
Basophils Relative: 0.4 % (ref 0.0–3.0)
EOS ABS: 0 10*3/uL (ref 0.0–0.7)
Eosinophils Relative: 0.2 % (ref 0.0–5.0)
HCT: 47.3 % — ABNORMAL HIGH (ref 36.0–46.0)
Hemoglobin: 15.4 g/dL — ABNORMAL HIGH (ref 12.0–15.0)
Lymphocytes Relative: 49.6 % — ABNORMAL HIGH (ref 12.0–46.0)
Lymphs Abs: 3.4 10*3/uL (ref 0.7–4.0)
MCHC: 32.5 g/dL (ref 30.0–36.0)
MCV: 98.6 fl (ref 78.0–100.0)
Monocytes Absolute: 0.6 10*3/uL (ref 0.1–1.0)
Monocytes Relative: 8.8 % (ref 3.0–12.0)
NEUTROS PCT: 41 % — AB (ref 43.0–77.0)
Neutro Abs: 2.8 10*3/uL (ref 1.4–7.7)
PLATELETS: 349 10*3/uL (ref 150.0–400.0)
RBC: 4.8 Mil/uL (ref 3.87–5.11)
RDW: 14.8 % — ABNORMAL HIGH (ref 11.5–14.6)
WBC: 6.9 10*3/uL (ref 4.5–10.5)

## 2013-11-29 NOTE — Progress Notes (Signed)
EUS 2014 done for incidentally noted abnormal pancreas: 1. UGI anatomy surgically altered from remote bariatric surgery,  currently similar to Bilroth I.  EUS findings:  1. There were two soft tissue lesions seen.  2. The first was round, 1.9cm across, located near but not in the  pancreatic head and abutting the right lobe of liver. This lesion  is hypoechoic, heterogeneous. I sampled the lesion with three  passes of a 25 guage EUS FNA needle.  3. The second lesion abutted the gastric wall along left lateral  side of remnant stomach. This is 4cm across, indistinctly bordered,  hypoechoic. It may focally communicate with the muscularis propria  layer of gastric wall. 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 I did not sample the lesion.  4. Limited views of pancreatic parenchyma, liver were all normal.  Impression:  Two abnormal lesions in abdomen. 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 The  other, which was the lesion for which she had this procedure, was  about 2cm, round and is of unclear etiology. FNA performed with  initial reading appearing to be cystic content. Await final  cytology for formal recommendations. She can resume lovenox shots  twice daily later today, resume coumadin today as well.   CT scan 11/14:  a 2.3 x 2.7 x 2.2 cm well-defined low-attenuation ovoid  shaped lesion which may extend exophytically off the inferior aspect  of segment 1 of the liver. This comes in close proximity to the  proximal body and of the pancreas, however, a pancreatic origin is  not strongly favored. This lesion demonstrates no internal  enhancement.    HPI: This is a    pleasant 58 year old woman whom I last saw about a year ago at the time of the endoscopic ultrasound test, see the above.  That test was being done for incidentally noted pancreatic abnormality.  husband died last year, still reeling from that.  She had questions about her esohagus.  Review of systems: Pertinent positive and negative review of systems were noted in the above HPI section. Complete review of systems was performed and was otherwise normal.    Past Medical History  Diagnosis Date  . Chest pain   . Anxiety   . Depression   . UTI (urinary tract infection)   . Obesity   . HTN (hypertension)   . Obesity   . Cystitis   . Palpitations   . Lung nodules   . History of urinary tract infection   . Pulmonary embolism     Complicating gastric bypass  . Hx of blood clots     Lungs  . CHF (congestive heart failure)   . Sleep apnea     no cpap now uses 02 2 l at night  . Pacemaker     medtronic  . Sinus node dysfunction 2009     status post placement of    Medtronic pacemaker by Dr. Lewayne Bunting in 2009.   Marland Kitchen Sinoatrial node dysfunction   . Headache(784.0)   . Motility disorder, esophageal   . Arthritis   . Fibromyalgia   . Thyroid nodule     Past Surgical History  Procedure Laterality Date  . Pacemaker placement      x2, pacemaker leads replaced  . Appendectomy    .  Cholecystectomy    . 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 Gastric bypass    . Stomach surgery      "2/3 of stomach removed"  . Abdominal hysterectomy    . Ectopic pregnancy surgery    . Foot surgery      both feet, and ankles  . Knee surgery Bilateral   . Rib resection    . Abdominal surgery      muscle deteriorated in abdomen  . Eus N/A 03/03/2013    Procedure: UPPER ENDOSCOPIC ULTRASOUND (EUS) LINEAR;  Surgeon: Rachael Fee, MD;  Location: [facility];  Service: Endoscopy;  Laterality: N/A;  . Laminectomy      Current Outpatient Prescriptions  Medication Sig Dispense Refill  . albuterol (PROAIR HFA) 108 (90 BASE) MCG/ACT inhaler Inhale 2 puffs into  the lungs every 6 (six) hours as needed for wheezing.      . cholecalciferol (VITAMIN D) 1000 UNITS tablet Take 1,000 Units by mouth daily.      . clonazePAM (KLONOPIN) 1 MG tablet Take 1 mg by mouth 2 (two) times daily.      . cyanocobalamin 1000 MCG tablet Take 1,000 mcg by mouth daily.       . methylcellulose (ARTIFICIAL TEARS) 1 % ophthalmic solution Place 2 drops into both eyes as needed (for dry eyes).       . metoprolol (LOPRESSOR) 50 MG tablet Take 50 mg by mouth 2 (two) times daily.       Marland Kitchen morphine (MS CONTIN) 15 MG 12 hr tablet Take 15 mg by mouth 3 (three) times daily.       . nitrofurantoin (MACRODANTIN) 100 MG capsule Take 100 mg by mouth every evening.      Marland Kitchen oxycodone (OXY-IR) 5 MG capsule Take 10 mg by mouth every 4 (four) hours as needed for pain.       . promethazine (PHENERGAN) 25 MG tablet Take 1 tablet (25 mg total) by mouth every 6 (six) hours as needed for nausea or vomiting.  15 tablet  0  . traZODone (DESYREL) 150 MG tablet Take 150 mg by mouth at bedtime.      Marland Kitchen warfarin (COUMADIN) 5 MG tablet Take 2.5-5 mg by mouth every evening. Takes 2.5mg  on Monday and Friday each week Takes 5mg  all other days      . meclizine (ANTIVERT) 50 MG tablet Take 0.5 tablets (25 mg total) by mouth 3 (three) times daily as needed.  20 tablet  0   No current facility-administered medications for this visit.    Allergies as of 11/29/2013 - Review Complete 11/29/2013  Allergen Reaction Noted  . Codeine Itching 03/17/2007  . Aliskiren fumarate  08/23/2008  . Bactrim [sulfamethoxazole-trimethoprim]  05/08/2013  . Eszopiclone  03/23/2008  . Iodine Other (See Comments) 12/12/2012  . Iohexol  07/11/2007  . Labetalol hcl Hypertension   . Penicillins Nausea And Vomiting 03/17/2007  . Tetanus toxoids Swelling 06/28/2012  . Topamax Other (See Comments) 09/27/2011  . Vancomycin Nausea Only 03/17/2007    Family History  Problem Relation Age of Onset  . Colon cancer Neg Hx   . Throat cancer  Neg Hx   . Esophageal cancer Neg Hx   . Stomach cancer Neg Hx   . Liver disease Father     PBC  . Kidney disease Neg Hx   . Diabetes Brother     twin  . Heart attack Brother  History   Social History  . Marital Status: Married    Spouse Name: N/A    Number of Children: N/A  . Years of Education: N/A   Occupational History  . Not on file.   Social History Main Topics  . Smoking status: Never Smoker   . Smokeless tobacco: Never Used  . Alcohol Use: No  . Drug Use: No  . Sexual Activity: Not on file   Other Topics Concern  . Not on file   Social History Narrative  . No narrative on file       Physical Exam: BP 122/84  Pulse 64  Ht 5' (1.524 m)  Wt 209 lb (94.802 kg)  BMI 40.82 kg/m2 Constitutional: generally well-appearing Psychiatric: alert and oriented x3 Eyes: extraocular movements intact Mouth: oral pharynx moist, no lesions Neck: supple no lymphadenopathy Cardiovascular: heart regular rate and rhythm Lungs: clear to auscultation bilaterally Abdomen: soft, nontender, nondistended, no obvious ascites, no peritoneal signs, normal bowel sounds Extremities: no lower extremity edema bilaterally Skin: no lesions on visible extremities    Assessment and plan: 58 y.o. female with  peripancreatic cyst, splenule, history of esophageal disease, dysphasia    for her peripancreatic cyst I'm going to set her up with repeat CT scan in 12 months from her last one which repeat November 2015. She probable variety of upper GI symptoms. She has been cared for here by another physician as well as [facility]. Esophageal [facility] 2 or 3 years ago, [facility] 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 She is still on opioids. I recommended she try to get off of these. We will get all of the records from her [facility] gastro[facility] sent here for review.

## 2013-11-29 NOTE — Patient Instructions (Signed)
Repeat CT scan in 08/2014 for pancreatic abnormality on CT scan. We will get records sent from your previous gastroenterologist for review ([facility]  This will include any endoscopic (colonoscopy or upper endoscopy) procedures and any associated pathology reports.  We already have the manometry results from 2012 buy we need all your other GI testing results. You should try to stop the narcotic pain meds which [facility] explained to your are causing your esophageal problems. You will have labs checked today in the basement lab.  Please head down after you check out with the front desk  (cbc, cmet).

## 2013-11-29 NOTE — Telephone Encounter (Signed)
Reviewed outside records [facility] Mot[facility] testing: 08/2012 Hi Res Esophageal Manometry: "impression markedly hypertensive UES, hypertensive LES, EGD outflow obstruction.  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

## 2013-11-30 ENCOUNTER — Ambulatory Visit (INDEPENDENT_AMBULATORY_CARE_PROVIDER_SITE_OTHER): Payer: Medicaid Other | Admitting: *Deleted

## 2013-11-30 DIAGNOSIS — I2699 Other pulmonary embolism without acute cor pulmonale: Secondary | ICD-10-CM

## 2013-11-30 DIAGNOSIS — Z7901 Long term (current) use of anticoagulants: Secondary | ICD-10-CM

## 2013-11-30 LAB — POCT INR: INR: 8

## 2013-12-01 ENCOUNTER — Telehealth: Payer: Self-pay | Admitting: Cardiology

## 2013-12-01 LAB — PROTIME-INR
INR: >10 (ref 0.8–1.2)
Prothrombin Time: >120 s — ABNORMAL HIGH (ref 9.1–12.0)

## 2013-12-01 NOTE — Telephone Encounter (Signed)
Telephoned pt this Am, see Anticoag note.

## 2013-12-01 NOTE — Telephone Encounter (Signed)
Called by [facility] due to critical result, INR of > 10. INR of 8 at POC at lab visit yesterday. Noted plan for Anticoag to follow up with phone call this AM for future directions with coumadin.

## 2013-12-05 ENCOUNTER — Ambulatory Visit (INDEPENDENT_AMBULATORY_CARE_PROVIDER_SITE_OTHER): Payer: Medicaid Other

## 2013-12-05 DIAGNOSIS — I2699 Other pulmonary embolism without acute cor pulmonale: Secondary | ICD-10-CM

## 2013-12-05 DIAGNOSIS — Z7901 Long term (current) use of anticoagulants: Secondary | ICD-10-CM

## 2013-12-05 LAB — POCT INR: INR: 1

## 2013-12-12 ENCOUNTER — Telehealth: Payer: Self-pay | Admitting: *Deleted

## 2013-12-12 ENCOUNTER — Ambulatory Visit (INDEPENDENT_AMBULATORY_CARE_PROVIDER_SITE_OTHER): Payer: Medicaid Other | Admitting: Pharmacist

## 2013-12-12 DIAGNOSIS — Z7901 Long term (current) use of anticoagulants: Secondary | ICD-10-CM

## 2013-12-12 LAB — POCT INR: INR: 1.4

## 2013-12-12 NOTE — Telephone Encounter (Signed)
Please call patient she is bruising really easily and is feeling bad. Please advise

## 2013-12-12 NOTE — Telephone Encounter (Signed)
Patient coming in at 11 am INR check

## 2013-12-21 ENCOUNTER — Ambulatory Visit (INDEPENDENT_AMBULATORY_CARE_PROVIDER_SITE_OTHER): Payer: Medicaid Other

## 2013-12-21 DIAGNOSIS — Z5181 Encounter for therapeutic drug level monitoring: Secondary | ICD-10-CM

## 2013-12-21 DIAGNOSIS — Z7901 Long term (current) use of anticoagulants: Secondary | ICD-10-CM

## 2013-12-21 LAB — POCT INR: INR: 3.9

## 2013-12-22 IMAGING — CR DG CHEST 2V
2 series · 2 of 2 positions shown · non-contrast
Comparison: Chest CT, 03/27/2013 and chest radiograph, 03/27/2013

CLINICAL DATA: Chest pain for 1 day and cough for 2 days

CHEST - 2 VIEW

[w chest pa]
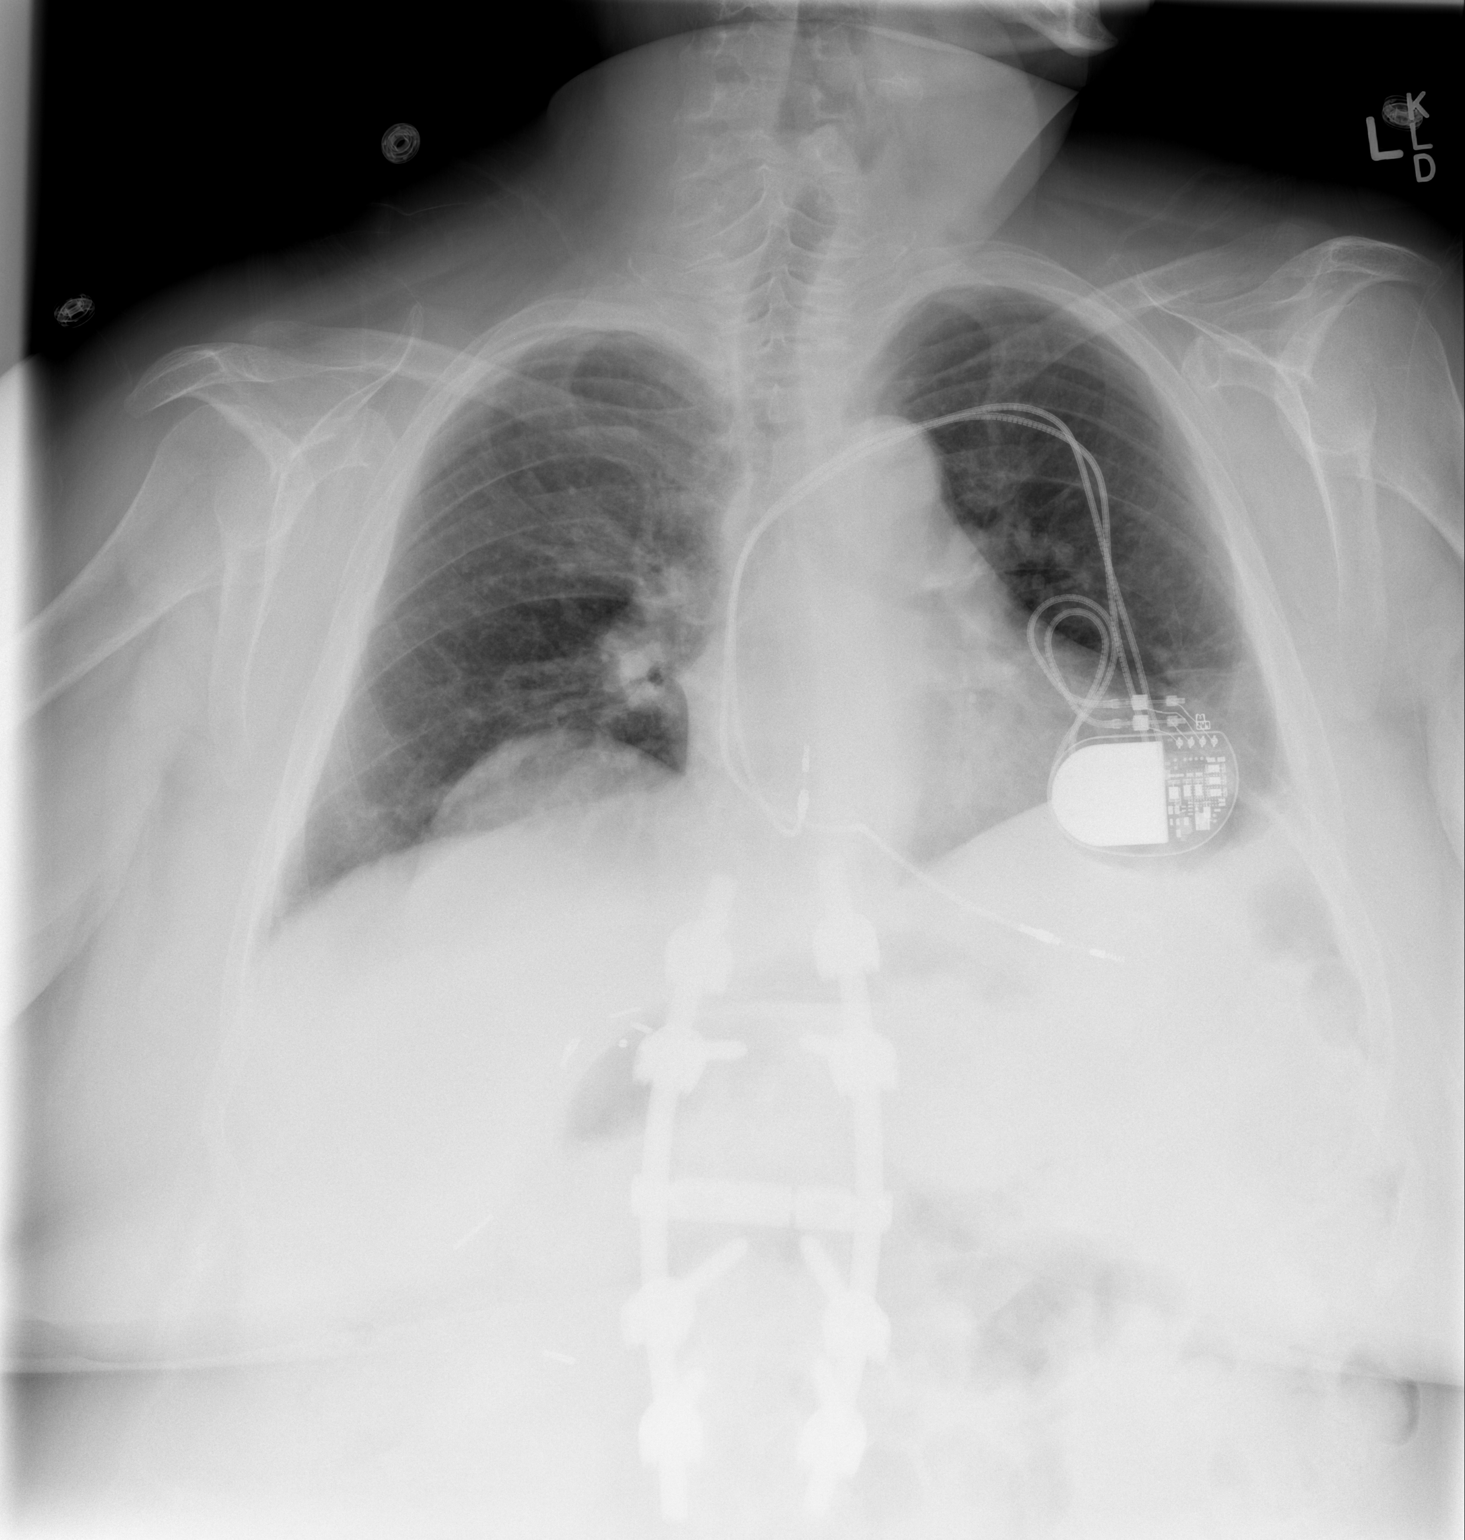

[w chest lat]
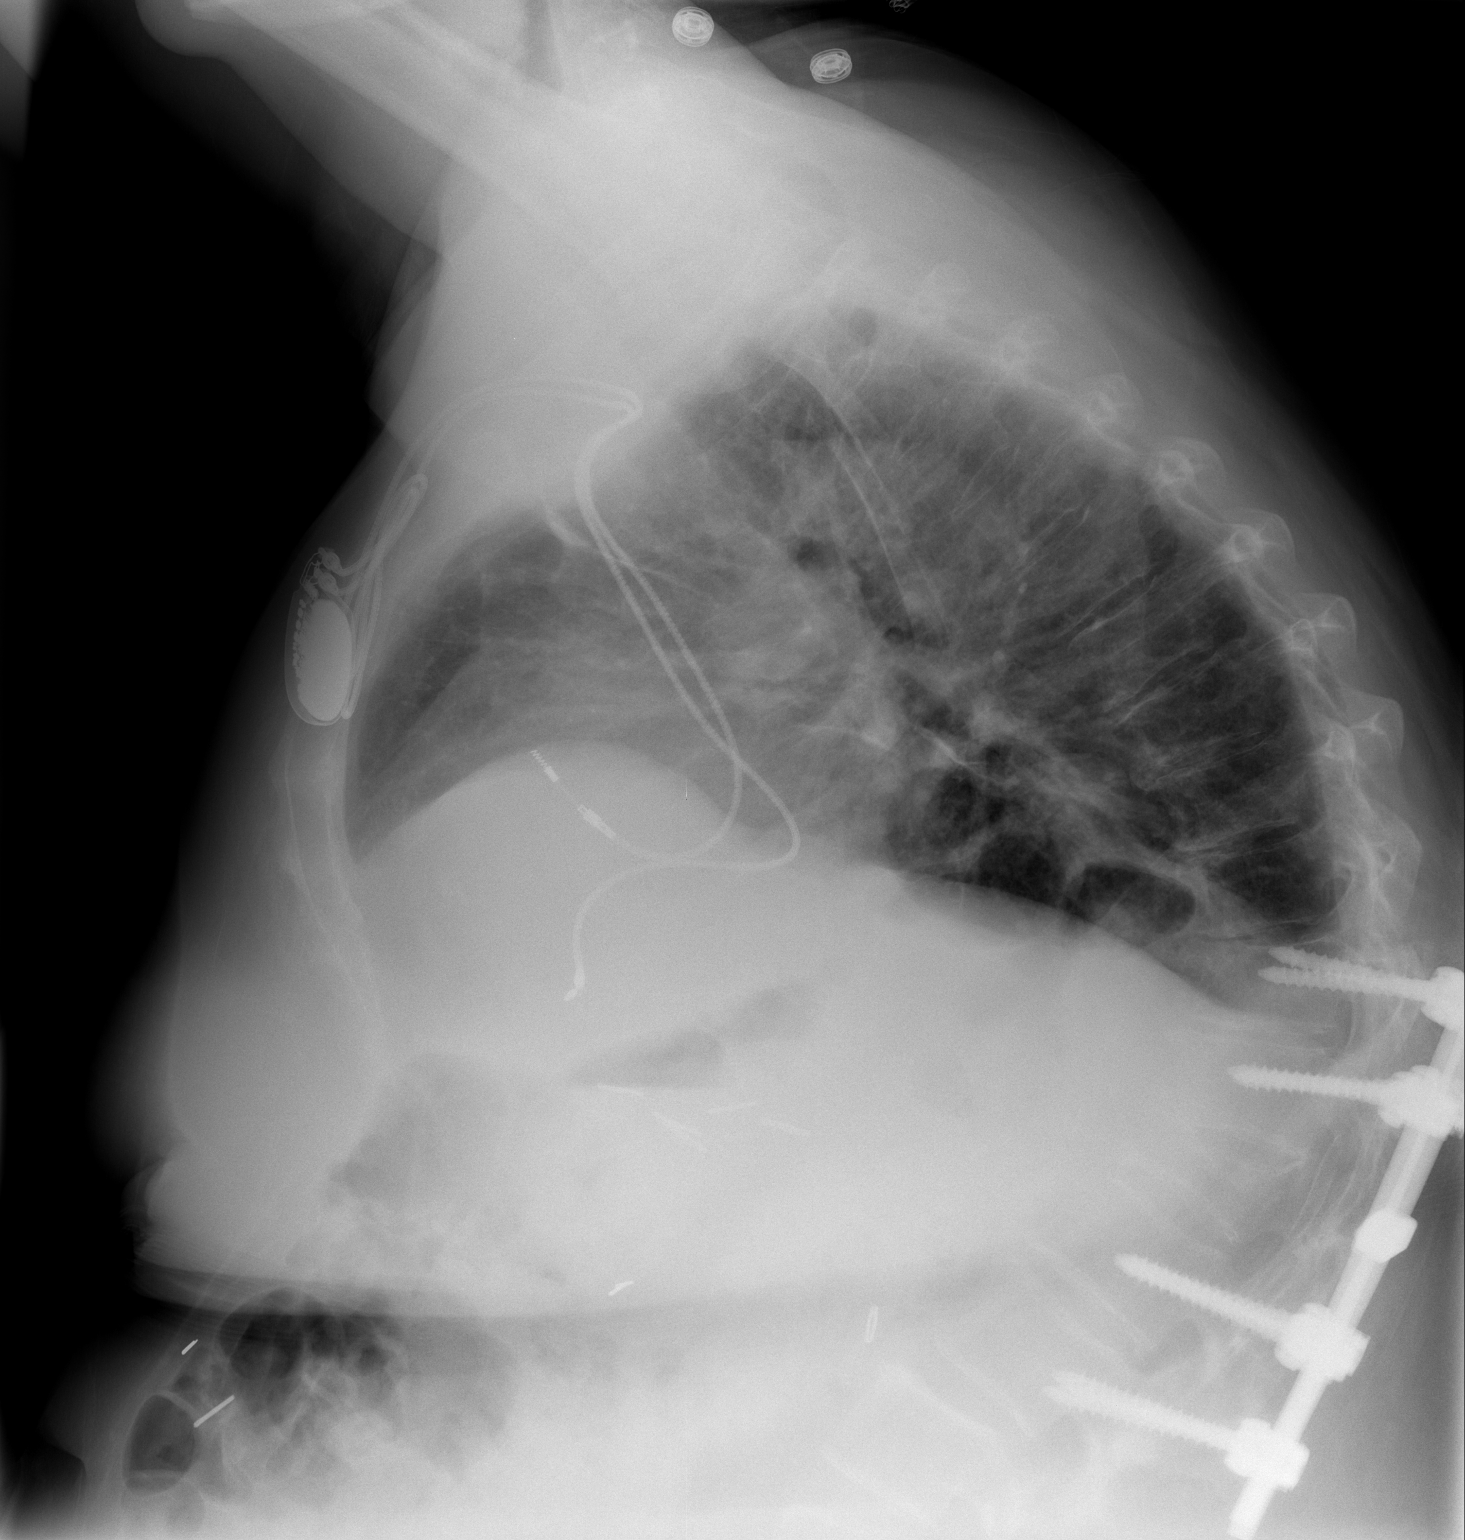

[2 of 2 positions shown; findings below may reference images not displayed]

FINDINGS: There are low lung volumes.  There is mild opacity at the
left lung base that is most likely atelectasis.  This is similar to
the prior exam.  The lungs are otherwise clear.  No pleural
effusion or pneumothorax.

The cardiac silhouette is normal in size.  No mediastinal or hilar
masses.  There is a stable well-positioned left anterior chest wall
sequential pacemaker.
IMPRESSION: No acute cardiopulmonary disease.  No change from the prior study.

## 2013-12-30 ENCOUNTER — Emergency Department (HOSPITAL_COMMUNITY)
Admission: EM | Admit: 2013-12-30 | Discharge: 2013-12-31 | Disposition: A | Discharge status: Home / Self Care | Payer: Medicaid Other | Attending: Emergency Medicine | Admitting: Emergency Medicine

## 2013-12-30 ENCOUNTER — Emergency Department (HOSPITAL_COMMUNITY): Payer: Medicaid Other

## 2013-12-30 ENCOUNTER — Encounter (HOSPITAL_COMMUNITY): Payer: Self-pay | Admitting: Emergency Medicine

## 2013-12-30 DIAGNOSIS — Z862 Personal history of diseases of the blood and blood-forming organs and certain disorders involving the immune mechanism: Secondary | ICD-10-CM | POA: Insufficient documentation

## 2013-12-30 DIAGNOSIS — R0789 Other chest pain: Secondary | ICD-10-CM

## 2013-12-30 DIAGNOSIS — Z79899 Other long term (current) drug therapy: Secondary | ICD-10-CM | POA: Insufficient documentation

## 2013-12-30 DIAGNOSIS — R0602 Shortness of breath: Secondary | ICD-10-CM | POA: Insufficient documentation

## 2013-12-30 DIAGNOSIS — Z950 Presence of cardiac pacemaker: Secondary | ICD-10-CM | POA: Insufficient documentation

## 2013-12-30 DIAGNOSIS — Z880 Allergy status to penicillin: Secondary | ICD-10-CM | POA: Insufficient documentation

## 2013-12-30 DIAGNOSIS — F3289 Other specified depressive episodes: Secondary | ICD-10-CM | POA: Insufficient documentation

## 2013-12-30 DIAGNOSIS — IMO0001 Reserved for inherently not codable concepts without codable children: Secondary | ICD-10-CM | POA: Insufficient documentation

## 2013-12-30 DIAGNOSIS — Z86711 Personal history of pulmonary embolism: Secondary | ICD-10-CM | POA: Insufficient documentation

## 2013-12-30 DIAGNOSIS — M129 Arthropathy, unspecified: Secondary | ICD-10-CM | POA: Insufficient documentation

## 2013-12-30 DIAGNOSIS — E669 Obesity, unspecified: Secondary | ICD-10-CM | POA: Insufficient documentation

## 2013-12-30 DIAGNOSIS — N390 Urinary tract infection, site not specified: Secondary | ICD-10-CM

## 2013-12-30 DIAGNOSIS — I509 Heart failure, unspecified: Secondary | ICD-10-CM | POA: Insufficient documentation

## 2013-12-30 DIAGNOSIS — Z87440 Personal history of urinary (tract) infections: Secondary | ICD-10-CM | POA: Insufficient documentation

## 2013-12-30 DIAGNOSIS — F329 Major depressive disorder, single episode, unspecified: Secondary | ICD-10-CM | POA: Insufficient documentation

## 2013-12-30 DIAGNOSIS — I10 Essential (primary) hypertension: Secondary | ICD-10-CM | POA: Insufficient documentation

## 2013-12-30 DIAGNOSIS — F411 Generalized anxiety disorder: Secondary | ICD-10-CM | POA: Insufficient documentation

## 2013-12-30 DIAGNOSIS — Z7901 Long term (current) use of anticoagulants: Secondary | ICD-10-CM | POA: Insufficient documentation

## 2013-12-30 LAB — CBC WITH DIFFERENTIAL/PLATELET
BASOS PCT: 0 % (ref 0–1)
Basophils Absolute: 0 10*3/uL (ref 0.0–0.1)
EOS ABS: 0 10*3/uL (ref 0.0–0.7)
Eosinophils Relative: 0 % (ref 0–5)
HCT: 40.6 % (ref 36.0–46.0)
Hemoglobin: 13.2 g/dL (ref 12.0–15.0)
Lymphocytes Relative: 52 % — ABNORMAL HIGH (ref 12–46)
Lymphs Abs: 4 10*3/uL (ref 0.7–4.0)
MCH: 32.2 pg (ref 26.0–34.0)
MCHC: 32.5 g/dL (ref 30.0–36.0)
MCV: 99 fL (ref 78.0–100.0)
Monocytes Absolute: 0.8 10*3/uL (ref 0.1–1.0)
Monocytes Relative: 10 % (ref 3–12)
NEUTROS ABS: 3 10*3/uL (ref 1.7–7.7)
Neutrophils Relative %: 39 % — ABNORMAL LOW (ref 43–77)
PLATELETS: 323 10*3/uL (ref 150–400)
RBC: 4.1 MIL/uL (ref 3.87–5.11)
RDW: 15.5 % (ref 11.5–15.5)
WBC: 7.8 10*3/uL (ref 4.0–10.5)

## 2013-12-30 LAB — BASIC METABOLIC PANEL
BUN: 9 mg/dL (ref 6–23)
CHLORIDE: 106 meq/L (ref 96–112)
CO2: 29 mEq/L (ref 19–32)
Calcium: 8.7 mg/dL (ref 8.4–10.5)
Creatinine, Ser: 0.68 mg/dL (ref 0.50–1.10)
Glucose, Bld: 74 mg/dL (ref 70–99)
POTASSIUM: 3.5 meq/L — AB (ref 3.7–5.3)
Sodium: 144 mEq/L (ref 137–147)

## 2013-12-30 LAB — PROTIME-INR
INR: 1.14 (ref 0.00–1.49)
Prothrombin Time: 14.4 seconds (ref 11.6–15.2)

## 2013-12-30 LAB — TROPONIN I: Troponin I: <0.3 ng/mL (ref <0.30)

## 2013-12-30 MED ORDER — IOHEXOL 350 MG/ML SOLN
100.0000 mL | Freq: Once | INTRAVENOUS | Status: AC | PRN
  Administered 2013-12-30: 100 mL via INTRAVENOUS

## 2013-12-30 MED ORDER — HYDROCORTISONE NA SUCCINATE PF 100 MG IJ SOLR
INTRAMUSCULAR | Status: AC
  Filled 2013-12-30: qty 4

## 2013-12-30 MED ORDER — DIPHENHYDRAMINE HCL 50 MG/ML IJ SOLN
50.0000 mg | Freq: Once | INTRAMUSCULAR | Status: AC
  Administered 2013-12-30: 50 mg via INTRAVENOUS
  Filled 2013-12-30: qty 1

## 2013-12-30 MED ORDER — HYDROCORTISONE NA SUCCINATE PF 250 MG IJ SOLR
200.0000 mg | Freq: Once | INTRAMUSCULAR | Status: AC
  Administered 2013-12-30: 200 mg via INTRAVENOUS
  Filled 2013-12-30: qty 200

## 2013-12-30 MED ORDER — MORPHINE SULFATE 4 MG/ML IJ SOLN
4.0000 mg | Freq: Once | INTRAMUSCULAR | Status: AC
  Administered 2013-12-30: 4 mg via INTRAVENOUS
  Filled 2013-12-30: qty 1

## 2013-12-30 MED ORDER — PREDNISONE 50 MG PO TABS
60.0000 mg | ORAL_TABLET | Freq: Once | ORAL | Status: AC
  Administered 2013-12-30: 60 mg via ORAL
  Filled 2013-12-30 (×2): qty 1

## 2013-12-30 MED ORDER — MORPHINE SULFATE 4 MG/ML IJ SOLN
4.0000 mg | Freq: Once | INTRAMUSCULAR | Status: AC
  Administered 2013-12-31: 4 mg via INTRAVENOUS
  Filled 2013-12-30: qty 1

## 2013-12-30 NOTE — ED Provider Notes (Signed)
TIME SEEN: 7:50 PM  CHIEF COMPLAINT: Chest pain, shortness of breath  HPI: Patient is a 58 y.o. F with history of pulmonary embolus after a gastric bypass, CHF, sinus node dysfunction status post pacemaker, hypertension, obesity, fibromyalgia who presents to the emergency department with complaints of constant right-sided chest pain. She reports that her pain started approximately 2 hours after bending over to pick something off the floor yesterday evening. She describes the pain is worse with palpation and also deep inspiration. She states this feels similar to her chest wall pain but also similar to her prior pulmonary emboli. She states that she has been bruising more than normal recently and therefore has not been taking her Coumadin for the past several days. She denies any fevers or chills. No nausea, vomiting, diaphoresis or dizziness. No lower extremity swelling or pain.  PCP is in [place] at [facility]  ROS: See HPI Constitutional: no fever  Eyes: no drainage  ENT: no runny nose   Cardiovascular:   chest pain  Resp:  SOB  GI: no vomiting GU: no dysuria Integumentary: no rash  Allergy: no hives  Musculoskeletal: no leg swelling  Neurological: no slurred speech ROS otherwise negative  PAST MEDICAL HISTORY/PAST SURGICAL HISTORY:  Past Medical History  Diagnosis Date  . Chest pain   . Anxiety   . Depression   . UTI (urinary tract infection)   . Obesity   . HTN (hypertension)   . Obesity   . Cystitis   . Palpitations   . Lung nodules   . History of urinary tract infection   . Pulmonary embolism     Complicating gastric bypass  . Hx of blood clots     Lungs  . CHF (congestive heart failure)   . Sleep apnea     no cpap now uses 02 2 l at night  . Pacemaker     medtronic  . Sinus node dysfunction 2009     status post placement of    Medtronic pacemaker by Dr. Lewayne Bunting in 2009.   Marland Kitchen Sinoatrial node dysfunction   . Headache(784.0)   . Motility disorder, esophageal   .  Arthritis   . Fibromyalgia   . Thyroid nodule     MEDICATIONS:  Prior to Admission medications   Medication Sig Start Date End Date Taking? Authorizing Provider  albuterol (PROAIR HFA) 108 (90 BASE) MCG/ACT inhaler Inhale 2 puffs into the lungs every 6 (six) hours as needed for wheezing.    Historical Provider, MD  cholecalciferol (VITAMIN D) 1000 UNITS tablet Take 1,000 Units by mouth daily.    Historical Provider, MD  clonazePAM (KLONOPIN) 1 MG tablet Take 1 mg by mouth 2 (two) times daily.    Historical Provider, MD  cyanocobalamin 1000 MCG tablet Take 1,000 mcg by mouth daily.     Historical Provider, MD  meclizine (ANTIVERT) 50 MG tablet Take 0.5 tablets (25 mg total) by mouth 3 (three) times daily as needed. 09/02/13   Junius Argyle, MD  methylcellulose (ARTIFICIAL TEARS) 1 % ophthalmic solution Place 2 drops into both eyes as needed (for dry eyes).     Historical Provider, MD  metoprolol (LOPRESSOR) 50 MG tablet Take 50 mg by mouth 2 (two) times daily.     Historical Provider, MD  morphine (MS CONTIN) 15 MG 12 hr tablet Take 15 mg by mouth 3 (three) times daily.     Historical Provider, MD  nitrofurantoin (MACRODANTIN) 100 MG capsule Take 100 mg by  mouth every evening.    Historical Provider, MD  oxycodone (OXY-IR) 5 MG capsule Take 10 mg by mouth every 4 (four) hours as needed for pain.  04/21/13   Loren Raceravid Yelverton, MD  promethazine (PHENERGAN) 25 MG tablet Take 1 tablet (25 mg total) by mouth every 6 (six) hours as needed for nausea or vomiting. 09/02/13   Junius ArgyleForrest S Harrison, MD  traZODone (DESYREL) 150 MG tablet Take 150 mg by mouth at bedtime.    Historical Provider, MD  warfarin (COUMADIN) 5 MG tablet Take 2.5-5 mg by mouth every evening. Takes 2.5mg  on Monday and Friday each week Takes 5mg  all other days    Historical Provider, MD    ALLERGIES:  Allergies  Allergen Reactions  . Codeine Itching  . Aliskiren Fumarate     REACTION: ANXIETY // SYNCOPE  . Bactrim  [Sulfamethoxazole-Trimethoprim]     R/T Coumadin  . Eszopiclone     REACTION: NIGHT MARES  . Iodine Other (See Comments)    seizures  . Iohexol      Desc: NAUSEA,VOMITING & SZ S/P CONTRAST INJ 30 YRS AGO// OK W/ PRE MEDS, BENADRYL & SOLUMEDROL TODAY//A.C., Onset Date: [id number]   . Labetalol Hcl Hypertension  . Penicillins Nausea And Vomiting    Cannot take tablets by mouth; IV is fine.   . Tetanus Toxoids Swelling    Fever, flushing and hardening of the injection site  . Topamax Other (See Comments)    Confusion, high blood pressure.   . Vancomycin Nausea Only    Severe chills    SOCIAL HISTORY:  History  Substance Use Topics  . Smoking status: Never Smoker   . Smokeless tobacco: Never Used  . Alcohol Use: No    FAMILY HISTORY: Family History  Problem Relation Age of Onset  . Colon cancer Neg Hx   . Throat cancer Neg Hx   . Esophageal cancer Neg Hx   . Stomach cancer Neg Hx   . Liver disease Father     PBC  . Kidney disease Neg Hx   . Diabetes Brother     twin  . Heart attack Brother     EXAM: BP 154/75  Pulse 69  Temp(Src) 97.8 F (36.6 C) (Oral)  Resp 18  Ht 5' (1.524 m)  Wt 204 lb (92.534 kg)  BMI 39.84 kg/m2  SpO2 98% CONSTITUTIONAL: Alert and oriented and responds appropriately to questions. Well-appearing; well-nourished HEAD: Normocephalic EYES: Conjunctivae clear, PERRL ENT: normal nose; no rhinorrhea; moist mucous membranes; pharynx without lesions noted NECK: Supple, no meningismus, no LAD  CARD: RRR; S1 and S2 appreciated; no murmurs, no clicks, no rubs, no gallops RESP: Normal chest excursion without splinting or tachypnea; breath sounds clear and equal bilaterally; no wheezes, no rhonchi, no rales, right chest wall is her to palpation with no ecchymosis or crepitus or deformity ABD/GI: Normal bowel sounds; non-distended; soft, non-tender, no rebound, no guarding BACK:  The back appears normal and is non-tender to palpation, there is no CVA  tenderness EXT: Normal ROM in all joints; non-tender to palpation; no edema; normal capillary refill; no cyanosis    SKIN: Normal color for age and race; warm NEURO: Moves all extremities equally PSYCH: The patient's mood and manner are appropriate. Grooming and personal hygiene are appropriate.  MEDICAL DECISION MAKING: Patient here with chest wall pain versus pulmonary embolus versus ACS. We'll obtain cardiac labs, chest x-ray. We'll perform CT scan the patient just given she has had a history of prior pulmonary  emboli and states she has not been taking her Coumadin for several days.  ED PROGRESS: Patient's troponin is negative. Her INR is subtherapeutic. Patient had to receive IV 200 mg of hydrocortisone one hour prior to CT imaging as well as IV 50 milligrams of Benadryl. She is currently awaiting her CT scan to rule out pulmonary embolus.   CXR and CT chest negative.  No PE, infiltrate, edema, PTX.  Likely musculoskeletal chest wall pain. We'll discharge her with pain medications and return precautions. Patient verbalizes understanding and is comfortable plan.     EKG Interpretation  Date/Time:  Friday December 30 2013 20:07:05 EST Ventricular Rate:  67 PR Interval:  184 QRS Duration: 134 QT Interval:  456 QTC Calculation: 481 R Axis:   54 Text Interpretation:  Normal sinus rhythm Right bundle branch block Abnormal ECG No significant change since last tracing Confirmed by Arjan Strohm,  DO, Nakhia Levitan [phone]) on 12/30/2013 8:13:34 PM        Layla Maw Sharee Sturdy, DO 12/31/13 [id number]

## 2013-12-30 NOTE — ED Notes (Signed)
Patient reports reached under play pin last night. Complaining of pain under right breast since. Also reports pain to back of ribs. States had intermittent episodes of shortness of breath and is concerned because she coughed up Susan Howell sputum with blood tinge once. Patient also states finished taking Macrobid two weeks ago, but reports she feels as if she has a UTI.

## 2013-12-31 LAB — URINALYSIS, ROUTINE W REFLEX MICROSCOPIC
BILIRUBIN URINE: NEGATIVE
Glucose, UA: NEGATIVE mg/dL
Hgb urine dipstick: NEGATIVE
Ketones, ur: NEGATIVE mg/dL
Leukocytes, UA: NEGATIVE
NITRITE: POSITIVE — AB
PROTEIN: NEGATIVE mg/dL
UROBILINOGEN UA: 0.2 mg/dL (ref 0.0–1.0)
pH: 6.5 (ref 5.0–8.0)

## 2013-12-31 LAB — URINE MICROSCOPIC-ADD ON

## 2013-12-31 MED ORDER — NITROFURANTOIN MONOHYD MACRO 100 MG PO CAPS
100.0000 mg | ORAL_CAPSULE | Freq: Two times a day (BID) | ORAL | Status: DC
  Administered 2013-12-31: 100 mg via ORAL
  Filled 2013-12-31: qty 1

## 2013-12-31 MED ORDER — NITROFURANTOIN MONOHYD MACRO 100 MG PO CAPS: 100.0000 mg | ORAL_CAPSULE | Freq: Two times a day (BID) | ORAL | Status: DC

## 2013-12-31 NOTE — Discharge Instructions (Signed)
Chest Wall Pain °Chest wall pain is pain in or around the bones and muscles of your chest. It may take up to 6 weeks to get better. It may take longer if you must stay physically active in your work and activities.  °CAUSES  °Chest wall pain may happen on its own. However, it may be caused by: °· A viral illness like the flu. °· Injury. °· Coughing. °· Exercise. °· Arthritis. °· Fibromyalgia. °· Shingles. °HOME CARE INSTRUCTIONS  °· Avoid overtiring physical activity. Try not to strain or perform activities that cause pain. This includes any activities using your chest or your abdominal and side muscles, especially if heavy weights are used. °· Put ice on the sore area. °· Put ice in a plastic bag. °· Place a towel between your skin and the bag. °· Leave the ice on for 15-20 minutes per hour while awake for the first 2 days. °· Only take over-the-counter or prescription medicines for pain, discomfort, or fever as directed by your caregiver. °SEEK IMMEDIATE MEDICAL CARE IF:  °· Your pain increases, or you are very uncomfortable. °· You have a fever. °· Your chest pain becomes worse. °· You have new, unexplained symptoms. °· You have nausea or vomiting. °· You feel sweaty or lightheaded. °· You have a cough with phlegm (sputum), or you cough up blood. °MAKE SURE YOU:  °· Understand these instructions. °· Will watch your condition. °· Will get help right away if you are not doing well or get worse. °Document Released: 10/13/2005 Document Revised: 01/05/2012 Document Reviewed: 06/09/2011 °ExitCare® Patient Information ©2014 [employer]. °Urinary Tract Infection °Urinary tract infections (UTIs) can develop anywhere along your urinary tract. Your urinary tract is your body's drainage system for removing wastes and extra water. Your urinary tract includes two kidneys, two ureters, a bladder, and a urethra. Your kidneys are a pair of bean-shaped organs. Each kidney is about the size of your fist. They are located below  your ribs, one on each side of your spine. °CAUSES °Infections are caused by microbes, which are microscopic organisms, including fungi, viruses, and bacteria. These organisms are so small that they can only be seen through a microscope. Bacteria are the microbes that most commonly cause UTIs. °SYMPTOMS  °Symptoms of UTIs may vary by age and gender of the patient and by the location of the infection. Symptoms in young women typically include a frequent and intense urge to urinate and a painful, burning feeling in the bladder or urethra during urination. Older women and men are more likely to be tired, shaky, and weak and have muscle aches and abdominal pain. A fever may mean the infection is in your kidneys. Other symptoms of a kidney infection include pain in your back or sides below the ribs, nausea, and vomiting. °DIAGNOSIS °To diagnose a UTI, your caregiver will ask you about your symptoms. Your caregiver also will ask to provide a urine sample. The urine sample will be tested for bacteria and white blood cells. White blood cells are made by your body to help fight infection. °TREATMENT  °Typically, UTIs can be treated with medication. Because most UTIs are caused by a bacterial infection, they usually can be treated with the use of antibiotics. The choice of antibiotic and length of treatment depend on your symptoms and the type of bacteria causing your infection. °HOME CARE INSTRUCTIONS °· If you were prescribed antibiotics, take them exactly as your caregiver instructs you. Finish the medication even if you feel better   after you have only taken some of the medication. °· Drink enough water and fluids to keep your urine clear or pale yellow. °· Avoid caffeine, tea, and carbonated beverages. They tend to irritate your bladder. °· Empty your bladder often. Avoid holding urine for long periods of time. °· Empty your bladder before and after sexual intercourse. °· After a bowel movement, women should cleanse from  front to back. Use each tissue only once. °SEEK MEDICAL CARE IF:  °· You have back pain. °· You develop a fever. °· Your symptoms do not begin to resolve within 3 days. °SEEK IMMEDIATE MEDICAL CARE IF:  °· You have severe back pain or lower abdominal pain. °· You develop chills. °· You have nausea or vomiting. °· You have continued burning or discomfort with urination. °MAKE SURE YOU:  °· Understand these instructions. °· Will watch your condition. °· Will get help right away if you are not doing well or get worse. °Document Released: 07/23/2005 Document Revised: 04/13/2012 Document Reviewed: 11/21/2011 °ExitCare® Patient Information ©2014 [employer]. ° °

## 2014-01-02 ENCOUNTER — Telehealth: Payer: Self-pay

## 2014-01-02 NOTE — Telephone Encounter (Signed)
Pt called and states she went to [facility] on Friday, and they told her her INR was 1.14. States she missed a dose on Friday. She was dx with a UTI and is on antibiotics. (Macrobid) She is asking if she needs to change her dosage of coumadin. Please call.

## 2014-01-03 NOTE — Telephone Encounter (Signed)
Spoke with pt, pt missed Sunday 01/01/14 and 12/30/13.  Pt's INR 1.14 on 12/30/13.  Advised to take 7.5mg  today, then resume same dosage 5mg  daily except 2.5mg  on Fridays.  Changed INR appt to recheck on 01/11/14.

## 2014-01-11 ENCOUNTER — Ambulatory Visit (INDEPENDENT_AMBULATORY_CARE_PROVIDER_SITE_OTHER): Payer: Medicaid Other

## 2014-01-11 DIAGNOSIS — Z5181 Encounter for therapeutic drug level monitoring: Secondary | ICD-10-CM

## 2014-01-11 DIAGNOSIS — Z7901 Long term (current) use of anticoagulants: Secondary | ICD-10-CM

## 2014-01-11 LAB — POCT INR: INR: 2.6

## 2014-02-02 ENCOUNTER — Encounter: Payer: Medicaid Other | Admitting: *Deleted

## 2014-02-09 ENCOUNTER — Encounter: Payer: Self-pay | Admitting: *Deleted

## 2014-02-13 ENCOUNTER — Encounter: Payer: Self-pay | Admitting: Internal Medicine

## 2014-02-13 ENCOUNTER — Other Ambulatory Visit: Payer: Self-pay | Admitting: Internal Medicine

## 2014-02-13 ENCOUNTER — Ambulatory Visit (INDEPENDENT_AMBULATORY_CARE_PROVIDER_SITE_OTHER): Payer: Medicaid Other | Admitting: *Deleted

## 2014-02-13 DIAGNOSIS — I495 Sick sinus syndrome: Secondary | ICD-10-CM

## 2014-02-16 ENCOUNTER — Ambulatory Visit (INDEPENDENT_AMBULATORY_CARE_PROVIDER_SITE_OTHER): Payer: Medicaid Other

## 2014-02-16 DIAGNOSIS — Z5181 Encounter for therapeutic drug level monitoring: Secondary | ICD-10-CM

## 2014-02-16 DIAGNOSIS — Z7901 Long term (current) use of anticoagulants: Secondary | ICD-10-CM

## 2014-02-16 LAB — POCT INR: INR: 2.5

## 2014-02-17 LAB — MDC_IDC_ENUM_SESS_TYPE_REMOTE
Battery Remaining Longevity: 120 mo
Battery Voltage: 2.8 V
Brady Statistic AS VP Percent: 0.4 %
Lead Channel Impedance Value: 687 Ohm
Lead Channel Pacing Threshold Amplitude: 1.5 V
Lead Channel Pacing Threshold Pulse Width: 0.4 ms
Lead Channel Sensing Intrinsic Amplitude: 8 mV
Lead Channel Setting Pacing Amplitude: 2 V
Lead Channel Setting Pacing Pulse Width: 1 ms
MDC IDC MSMT LEADCHNL RA PACING THRESHOLD AMPLITUDE: 1.375 V
MDC IDC MSMT LEADCHNL RA PACING THRESHOLD PULSEWIDTH: 0.4 ms
MDC IDC MSMT LEADCHNL RA SENSING INTR AMPL: 1.4 mV
MDC IDC MSMT LEADCHNL RV IMPEDANCE VALUE: 722 Ohm
MDC IDC SET LEADCHNL RV PACING AMPLITUDE: 2.5 V
MDC IDC SET LEADCHNL RV SENSING SENSITIVITY: 4 mV
MDC IDC STAT BRADY AP VP PERCENT: 0.1 %
MDC IDC STAT BRADY AP VS PERCENT: 3.5 %
MDC IDC STAT BRADY AS VS PERCENT: 96 %

## 2014-02-27 NOTE — Progress Notes (Signed)
PPM remote 

## 2014-02-28 ENCOUNTER — Telehealth: Payer: Self-pay

## 2014-02-28 NOTE — Telephone Encounter (Signed)
Message copied by Donata DuffLEWIS, Batina Dougan L on Tue Feb 28, 2014  7:32 AM ------      Message from: Donata DuffLEWIS, Margaret Cockerill L      Created: Mon Oct 31, 2013  8:22 AM        CT scan pancreatic protocol in May 2015  ------

## 2014-03-01 ENCOUNTER — Encounter: Payer: Self-pay | Admitting: Cardiology

## 2014-03-01 NOTE — Telephone Encounter (Signed)
Per last office note repeat CT not due until 08/2014

## 2014-03-08 ENCOUNTER — Telehealth: Payer: Self-pay

## 2014-03-08 NOTE — Telephone Encounter (Signed)
Message copied by Donata DuffLEWIS, Sarely Stracener L on Wed Mar 08, 2014  8:16 AM ------      Message from: Donata DuffLEWIS, Clarene Curran L      Created: Tue Mar 08, 2013  1:13 PM        CT scan pancreatic protocol.  ------

## 2014-03-08 NOTE — Telephone Encounter (Signed)
CT needed 08/2014

## 2014-03-21 ENCOUNTER — Other Ambulatory Visit: Payer: Self-pay | Admitting: *Deleted

## 2014-03-21 NOTE — Telephone Encounter (Signed)
Please review for refill, Thank You. 

## 2014-03-22 ENCOUNTER — Ambulatory Visit (INDEPENDENT_AMBULATORY_CARE_PROVIDER_SITE_OTHER): Payer: Medicaid Other

## 2014-03-22 DIAGNOSIS — Z5181 Encounter for therapeutic drug level monitoring: Secondary | ICD-10-CM

## 2014-03-22 DIAGNOSIS — Z7901 Long term (current) use of anticoagulants: Secondary | ICD-10-CM

## 2014-03-22 LAB — POCT INR: INR: 1.9

## 2014-03-22 MED ORDER — WARFARIN SODIUM 5 MG PO TABS: ORAL_TABLET | ORAL | Status: DC

## 2014-03-28 ENCOUNTER — Emergency Department (HOSPITAL_COMMUNITY)
Admission: EM | Admit: 2014-03-28 | Discharge: 2014-03-28 | Disposition: A | Discharge status: Home / Self Care | Payer: Medicaid Other | Attending: Emergency Medicine | Admitting: Emergency Medicine

## 2014-03-28 ENCOUNTER — Encounter (HOSPITAL_COMMUNITY): Payer: Self-pay | Admitting: Emergency Medicine

## 2014-03-28 ENCOUNTER — Emergency Department (HOSPITAL_COMMUNITY): Payer: Medicaid Other

## 2014-03-28 DIAGNOSIS — F411 Generalized anxiety disorder: Secondary | ICD-10-CM | POA: Insufficient documentation

## 2014-03-28 DIAGNOSIS — S39012A Strain of muscle, fascia and tendon of lower back, initial encounter: Secondary | ICD-10-CM

## 2014-03-28 DIAGNOSIS — Z87448 Personal history of other diseases of urinary system: Secondary | ICD-10-CM | POA: Insufficient documentation

## 2014-03-28 DIAGNOSIS — Z9981 Dependence on supplemental oxygen: Secondary | ICD-10-CM | POA: Insufficient documentation

## 2014-03-28 DIAGNOSIS — I495 Sick sinus syndrome: Secondary | ICD-10-CM | POA: Insufficient documentation

## 2014-03-28 DIAGNOSIS — Z7901 Long term (current) use of anticoagulants: Secondary | ICD-10-CM | POA: Insufficient documentation

## 2014-03-28 DIAGNOSIS — Z79899 Other long term (current) drug therapy: Secondary | ICD-10-CM | POA: Insufficient documentation

## 2014-03-28 DIAGNOSIS — G4730 Sleep apnea, unspecified: Secondary | ICD-10-CM | POA: Insufficient documentation

## 2014-03-28 DIAGNOSIS — Z86711 Personal history of pulmonary embolism: Secondary | ICD-10-CM | POA: Insufficient documentation

## 2014-03-28 DIAGNOSIS — Y92410 Unspecified street and highway as the place of occurrence of the external cause: Secondary | ICD-10-CM | POA: Insufficient documentation

## 2014-03-28 DIAGNOSIS — Z8709 Personal history of other diseases of the respiratory system: Secondary | ICD-10-CM | POA: Insufficient documentation

## 2014-03-28 DIAGNOSIS — Y9389 Activity, other specified: Secondary | ICD-10-CM | POA: Insufficient documentation

## 2014-03-28 DIAGNOSIS — S3981XA Other specified injuries of abdomen, initial encounter: Secondary | ICD-10-CM | POA: Insufficient documentation

## 2014-03-28 DIAGNOSIS — I10 Essential (primary) hypertension: Secondary | ICD-10-CM | POA: Insufficient documentation

## 2014-03-28 DIAGNOSIS — Z880 Allergy status to penicillin: Secondary | ICD-10-CM | POA: Insufficient documentation

## 2014-03-28 DIAGNOSIS — Z862 Personal history of diseases of the blood and blood-forming organs and certain disorders involving the immune mechanism: Secondary | ICD-10-CM | POA: Insufficient documentation

## 2014-03-28 DIAGNOSIS — Z8719 Personal history of other diseases of the digestive system: Secondary | ICD-10-CM | POA: Insufficient documentation

## 2014-03-28 DIAGNOSIS — I509 Heart failure, unspecified: Secondary | ICD-10-CM | POA: Insufficient documentation

## 2014-03-28 DIAGNOSIS — R638 Other symptoms and signs concerning food and fluid intake: Secondary | ICD-10-CM | POA: Insufficient documentation

## 2014-03-28 DIAGNOSIS — Z86718 Personal history of other venous thrombosis and embolism: Secondary | ICD-10-CM | POA: Insufficient documentation

## 2014-03-28 DIAGNOSIS — Z8739 Personal history of other diseases of the musculoskeletal system and connective tissue: Secondary | ICD-10-CM | POA: Insufficient documentation

## 2014-03-28 DIAGNOSIS — S335XXA Sprain of ligaments of lumbar spine, initial encounter: Secondary | ICD-10-CM | POA: Insufficient documentation

## 2014-03-28 DIAGNOSIS — Z8639 Personal history of other endocrine, nutritional and metabolic disease: Secondary | ICD-10-CM | POA: Insufficient documentation

## 2014-03-28 DIAGNOSIS — Z87440 Personal history of urinary (tract) infections: Secondary | ICD-10-CM | POA: Insufficient documentation

## 2014-03-28 DIAGNOSIS — E669 Obesity, unspecified: Secondary | ICD-10-CM | POA: Insufficient documentation

## 2014-03-28 DIAGNOSIS — Z950 Presence of cardiac pacemaker: Secondary | ICD-10-CM | POA: Insufficient documentation

## 2014-03-28 MED ORDER — HYDROMORPHONE HCL PF 1 MG/ML IJ SOLN
1.0000 mg | Freq: Once | INTRAMUSCULAR | Status: AC
  Administered 2014-03-28: 1 mg via INTRAMUSCULAR
  Filled 2014-03-28: qty 1
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
## 2014-03-28 NOTE — Discharge Instructions (Signed)
Continue your pain medications as before.  Return to the emergency department if you develop any new or concerning symptoms.   Back Pain, Adult Low back pain is very common. About 1 in 5 people have back pain.The cause of low back pain is rarely dangerous. The pain often gets better over time.About half of people with a sudden onset of back pain feel better in just 2 weeks. About 8 in 10 people feel better by 6 weeks.  CAUSES Some common causes of back pain include:  Strain of the muscles or ligaments supporting the spine.  Wear and tear (degeneration) of the spinal discs.  Arthritis.  Direct injury to the back. DIAGNOSIS Most of the time, the direct cause of low back pain is not known.However, back pain can be treated effectively even when the exact cause of the pain is unknown.Answering your caregiver's questions about your overall health and symptoms is one of the most accurate ways to make sure the cause of your pain is not dangerous. If your caregiver needs more information, he or she may order lab work or imaging tests (X-rays or MRIs).However, even if imaging tests show changes in your back, this usually does not require surgery. HOME CARE INSTRUCTIONS For many people, back pain returns.Since low back pain is rarely dangerous, it is often a condition that people can learn to [facility] their own.   Remain active. It is stressful on the back to sit or stand in one place. Do not sit, drive, or stand in one place for more than 30 minutes at a time. Take short walks on level surfaces as soon as pain allows.Try to increase the length of time you walk each day.  Do not stay in bed.Resting more than 1 or 2 days can delay your recovery.  Do not avoid exercise or work.Your body is made to move.It is not dangerous to be active, even though your back may hurt.Your back will likely heal faster if you return to being active before your pain is gone.  Pay attention to your body when  you bend and lift. Many people have less discomfortwhen lifting if they bend their knees, keep the load close to their bodies,and avoid twisting. Often, the most comfortable positions are those that put less stress on your recovering back.  Find a comfortable position to sleep. Use a firm mattress and lie on your side with your knees slightly bent. If you lie on your back, put a pillow under your knees.  Only take over-the-counter or prescription medicines as directed by your caregiver. Over-the-counter medicines to reduce pain and inflammation are often the most helpful.Your caregiver may prescribe muscle relaxant drugs.These medicines help dull your pain so you can more quickly return to your normal activities and healthy exercise.  Put ice on the injured area.  Put ice in a plastic bag.  Place a towel between your skin and the bag.  Leave the ice on for 15-20 minutes, 03-04 times a day for the first 2 to 3 days. After that, ice and heat may be alternated to reduce pain and spasms.  Ask your caregiver about trying back exercises and gentle massage. This may be of some benefit.  Avoid feeling anxious or stressed.Stress increases muscle tension and can worsen back pain.It is important to recognize when you are anxious or stressed and learn ways to manage it.Exercise is a great option. SEEK MEDICAL CARE IF:  You have pain that is not relieved with rest or medicine.  You  have pain that does not improve in 1 week.  You have new symptoms.  You are generally not feeling well. SEEK IMMEDIATE MEDICAL CARE IF:   You have pain that radiates from your back into your legs.  You develop new bowel or bladder control problems.  You have unusual weakness or numbness in your arms or legs.  You develop nausea or vomiting.  You develop abdominal pain.  You feel faint. Document Released: 10/13/2005 Document Revised: 04/13/2012 Document Reviewed: 03/03/2011 [facility] Patient Information  2014 [place], [place].
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
## 2014-04-03 ENCOUNTER — Telehealth: Payer: Self-pay | Admitting: Gastroenterology

## 2014-04-03 NOTE — Telephone Encounter (Signed)
Report faxed.

## 2014-04-07 ENCOUNTER — Ambulatory Visit (INDEPENDENT_AMBULATORY_CARE_PROVIDER_SITE_OTHER): Payer: Medicaid Other

## 2014-04-07 DIAGNOSIS — Z5181 Encounter for therapeutic drug level monitoring: Secondary | ICD-10-CM

## 2014-04-07 DIAGNOSIS — Z7901 Long term (current) use of anticoagulants: Secondary | ICD-10-CM

## 2014-04-07 LAB — POCT INR: INR: 2.2

## 2014-05-05 ENCOUNTER — Ambulatory Visit (INDEPENDENT_AMBULATORY_CARE_PROVIDER_SITE_OTHER): Payer: Medicaid Other

## 2014-05-05 DIAGNOSIS — Z7901 Long term (current) use of anticoagulants: Secondary | ICD-10-CM

## 2014-05-05 DIAGNOSIS — Z5181 Encounter for therapeutic drug level monitoring: Secondary | ICD-10-CM

## 2014-05-05 LAB — POCT INR: INR: 1.9

## 2014-05-17 ENCOUNTER — Telehealth: Payer: Self-pay | Admitting: Cardiology

## 2014-05-17 ENCOUNTER — Encounter: Payer: Medicaid Other | Admitting: *Deleted

## 2014-05-17 NOTE — Telephone Encounter (Signed)
Spoke with pt and reminded pt of remote transmission that is due today. Pt verbalized understanding.   

## 2014-05-18 ENCOUNTER — Encounter: Payer: Self-pay | Admitting: Cardiology

## 2014-05-25 ENCOUNTER — Ambulatory Visit (INDEPENDENT_AMBULATORY_CARE_PROVIDER_SITE_OTHER): Payer: Medicaid Other | Admitting: Pharmacist

## 2014-05-25 DIAGNOSIS — Z5181 Encounter for therapeutic drug level monitoring: Secondary | ICD-10-CM

## 2014-05-25 DIAGNOSIS — Z7901 Long term (current) use of anticoagulants: Secondary | ICD-10-CM

## 2014-05-25 DIAGNOSIS — J984 Other disorders of lung: Secondary | ICD-10-CM

## 2014-05-25 LAB — POCT INR: INR: 1.6

## 2014-05-29 ENCOUNTER — Ambulatory Visit (INDEPENDENT_AMBULATORY_CARE_PROVIDER_SITE_OTHER): Payer: Medicaid Other | Admitting: *Deleted

## 2014-05-29 DIAGNOSIS — I495 Sick sinus syndrome: Secondary | ICD-10-CM

## 2014-05-29 LAB — MDC_IDC_ENUM_SESS_TYPE_REMOTE
Battery Impedance: 332 Ohm
Battery Voltage: 2.79 V
Brady Statistic AP VP Percent: 0 %
Brady Statistic AP VS Percent: 8 %
Brady Statistic AS VS Percent: 92 %
Date Time Interrogation Session: 20150803173505
Lead Channel Impedance Value: 700 Ohm
Lead Channel Impedance Value: 760 Ohm
Lead Channel Pacing Threshold Amplitude: 1 V
Lead Channel Pacing Threshold Amplitude: 1.5 V
Lead Channel Pacing Threshold Pulse Width: 0.4 ms
Lead Channel Pacing Threshold Pulse Width: 0.4 ms
Lead Channel Sensing Intrinsic Amplitude: 2.8 mV
Lead Channel Setting Pacing Amplitude: 2.5 V
Lead Channel Setting Sensing Sensitivity: 4 mV
MDC IDC MSMT BATTERY REMAINING LONGEVITY: 115 mo
MDC IDC MSMT LEADCHNL RV SENSING INTR AMPL: 22.4 mV
MDC IDC SET LEADCHNL RA PACING AMPLITUDE: 3 V
MDC IDC SET LEADCHNL RV PACING PULSEWIDTH: 1 ms
MDC IDC STAT BRADY AS VP PERCENT: 0 %

## 2014-05-30 NOTE — Progress Notes (Signed)
Remote pacemaker transmission.   

## 2014-06-04 ENCOUNTER — Other Ambulatory Visit: Payer: Self-pay | Admitting: Internal Medicine

## 2014-06-07 ENCOUNTER — Ambulatory Visit (INDEPENDENT_AMBULATORY_CARE_PROVIDER_SITE_OTHER): Payer: Medicaid Other | Admitting: *Deleted

## 2014-06-07 DIAGNOSIS — Z5181 Encounter for therapeutic drug level monitoring: Secondary | ICD-10-CM

## 2014-06-07 DIAGNOSIS — Z7901 Long term (current) use of anticoagulants: Secondary | ICD-10-CM

## 2014-06-07 LAB — POCT INR: INR: 1.9

## 2014-06-18 ENCOUNTER — Emergency Department (HOSPITAL_COMMUNITY)
Admission: EM | Admit: 2014-06-18 | Discharge: 2014-06-18 | Disposition: A | Discharge status: Home / Self Care | Payer: Medicaid Other | Attending: Emergency Medicine | Admitting: Emergency Medicine

## 2014-06-18 ENCOUNTER — Encounter (HOSPITAL_COMMUNITY): Payer: Self-pay | Admitting: Emergency Medicine

## 2014-06-18 DIAGNOSIS — Z950 Presence of cardiac pacemaker: Secondary | ICD-10-CM | POA: Diagnosis not present

## 2014-06-18 DIAGNOSIS — IMO0001 Reserved for inherently not codable concepts without codable children: Secondary | ICD-10-CM | POA: Diagnosis not present

## 2014-06-18 DIAGNOSIS — Z87440 Personal history of urinary (tract) infections: Secondary | ICD-10-CM | POA: Diagnosis not present

## 2014-06-18 DIAGNOSIS — R011 Cardiac murmur, unspecified: Secondary | ICD-10-CM | POA: Insufficient documentation

## 2014-06-18 DIAGNOSIS — F411 Generalized anxiety disorder: Secondary | ICD-10-CM | POA: Insufficient documentation

## 2014-06-18 DIAGNOSIS — R5381 Other malaise: Secondary | ICD-10-CM | POA: Diagnosis not present

## 2014-06-18 DIAGNOSIS — Z8742 Personal history of other diseases of the female genital tract: Secondary | ICD-10-CM | POA: Diagnosis not present

## 2014-06-18 DIAGNOSIS — F329 Major depressive disorder, single episode, unspecified: Secondary | ICD-10-CM | POA: Insufficient documentation

## 2014-06-18 DIAGNOSIS — Z8719 Personal history of other diseases of the digestive system: Secondary | ICD-10-CM | POA: Insufficient documentation

## 2014-06-18 DIAGNOSIS — I10 Essential (primary) hypertension: Secondary | ICD-10-CM | POA: Insufficient documentation

## 2014-06-18 DIAGNOSIS — Z86718 Personal history of other venous thrombosis and embolism: Secondary | ICD-10-CM | POA: Insufficient documentation

## 2014-06-18 DIAGNOSIS — I509 Heart failure, unspecified: Secondary | ICD-10-CM | POA: Insufficient documentation

## 2014-06-18 DIAGNOSIS — Z86711 Personal history of pulmonary embolism: Secondary | ICD-10-CM | POA: Diagnosis not present

## 2014-06-18 DIAGNOSIS — R609 Edema, unspecified: Secondary | ICD-10-CM | POA: Diagnosis not present

## 2014-06-18 DIAGNOSIS — R600 Localized edema: Secondary | ICD-10-CM

## 2014-06-18 DIAGNOSIS — Z8739 Personal history of other diseases of the musculoskeletal system and connective tissue: Secondary | ICD-10-CM | POA: Insufficient documentation

## 2014-06-18 DIAGNOSIS — Z79899 Other long term (current) drug therapy: Secondary | ICD-10-CM | POA: Insufficient documentation

## 2014-06-18 DIAGNOSIS — IMO0002 Reserved for concepts with insufficient information to code with codable children: Secondary | ICD-10-CM | POA: Insufficient documentation

## 2014-06-18 DIAGNOSIS — F3289 Other specified depressive episodes: Secondary | ICD-10-CM | POA: Insufficient documentation

## 2014-06-18 DIAGNOSIS — M7989 Other specified soft tissue disorders: Secondary | ICD-10-CM | POA: Insufficient documentation

## 2014-06-18 DIAGNOSIS — Z880 Allergy status to penicillin: Secondary | ICD-10-CM | POA: Insufficient documentation

## 2014-06-18 DIAGNOSIS — M25579 Pain in unspecified ankle and joints of unspecified foot: Secondary | ICD-10-CM | POA: Diagnosis not present

## 2014-06-18 DIAGNOSIS — Z9884 Bariatric surgery status: Secondary | ICD-10-CM | POA: Diagnosis not present

## 2014-06-18 DIAGNOSIS — R5383 Other fatigue: Secondary | ICD-10-CM

## 2014-06-18 DIAGNOSIS — Z7901 Long term (current) use of anticoagulants: Secondary | ICD-10-CM | POA: Insufficient documentation

## 2014-06-18 DIAGNOSIS — R233 Spontaneous ecchymoses: Secondary | ICD-10-CM | POA: Diagnosis not present

## 2014-06-18 DIAGNOSIS — Z8639 Personal history of other endocrine, nutritional and metabolic disease: Secondary | ICD-10-CM | POA: Insufficient documentation

## 2014-06-18 DIAGNOSIS — Z862 Personal history of diseases of the blood and blood-forming organs and certain disorders involving the immune mechanism: Secondary | ICD-10-CM | POA: Diagnosis not present

## 2014-06-18 LAB — CBC WITH DIFFERENTIAL/PLATELET
BASOS PCT: 0 % (ref 0–1)
Basophils Absolute: 0 10*3/uL (ref 0.0–0.1)
EOS ABS: 0.1 10*3/uL (ref 0.0–0.7)
EOS PCT: 1 % (ref 0–5)
HEMATOCRIT: 36.6 % (ref 36.0–46.0)
Hemoglobin: 12.4 g/dL (ref 12.0–15.0)
Lymphocytes Relative: 55 % — ABNORMAL HIGH (ref 12–46)
Lymphs Abs: 4.6 10*3/uL — ABNORMAL HIGH (ref 0.7–4.0)
MCH: 32.2 pg (ref 26.0–34.0)
MCHC: 33.9 g/dL (ref 30.0–36.0)
MCV: 95.1 fL (ref 78.0–100.0)
MONO ABS: 1 10*3/uL (ref 0.1–1.0)
Monocytes Relative: 12 % (ref 3–12)
Neutro Abs: 2.6 10*3/uL (ref 1.7–7.7)
Neutrophils Relative %: 32 % — ABNORMAL LOW (ref 43–77)
Platelets: 312 10*3/uL (ref 150–400)
RBC: 3.85 MIL/uL — ABNORMAL LOW (ref 3.87–5.11)
RDW: 14.8 % (ref 11.5–15.5)
WBC: 8.3 10*3/uL (ref 4.0–10.5)

## 2014-06-18 LAB — BASIC METABOLIC PANEL
ANION GAP: 11 (ref 5–15)
BUN: 13 mg/dL (ref 6–23)
CALCIUM: 8.9 mg/dL (ref 8.4–10.5)
CO2: 29 mEq/L (ref 19–32)
CREATININE: 0.68 mg/dL (ref 0.50–1.10)
Chloride: 102 mEq/L (ref 96–112)
GFR calc Af Amer: >90 mL/min (ref >90)
Glucose, Bld: 88 mg/dL (ref 70–99)
Potassium: 3.7 mEq/L (ref 3.7–5.3)
Sodium: 142 mEq/L (ref 137–147)

## 2014-06-18 LAB — PRO B NATRIURETIC PEPTIDE: Pro B Natriuretic peptide (BNP): 362.1 pg/mL — ABNORMAL HIGH (ref 0–125)

## 2014-06-18 LAB — PROTIME-INR
INR: 2.49 — AB (ref 0.00–1.49)
Prothrombin Time: 26.9 seconds — ABNORMAL HIGH (ref 11.6–15.2)

## 2014-06-18 MED ORDER — FUROSEMIDE 20 MG PO TABS
40.0000 mg | ORAL_TABLET | Freq: Once | ORAL | Status: AC
  Administered 2014-06-18: 40 mg via ORAL
  Filled 2014-06-18: qty 2

## 2014-06-18 NOTE — ED Notes (Signed)
Pt c/o bilateral leg swelling since Friday. Pt states she gained five pounds on Friday. Pt took a 40 mg Lasix on Friday. Pt also c/o bilateral leg pain with the swelling. Pt ambulatory with cane and steady gait. Pt denies shortness of breath and chest pain.

## 2014-06-18 NOTE — Discharge Instructions (Signed)
Please take 20 mg lasix for the next 3-5 days and follow up with your doctor. Elevate your legs frequently and use compression socks to also help with swelling. Return for any worsening symptoms.   Peripheral Edema You have swelling in your legs (peripheral edema). This swelling is due to excess accumulation of salt and water in your body. Edema may be a sign of heart, kidney or liver disease, or a side effect of a medication. It may also be due to problems in the leg veins. Elevating your legs and using special support stockings may be very helpful, if the cause of the swelling is due to poor venous circulation. Avoid long periods of standing, whatever the cause. Treatment of edema depends on identifying the cause. Chips, pretzels, pickles and other salty foods should be avoided. Restricting salt in your diet is almost always needed. Water pills (diuretics) are often used to remove the excess salt and water from your body via urine. These medicines prevent the kidney from reabsorbing sodium. This increases urine flow. Diuretic treatment may also result in lowering of potassium levels in your body. Potassium supplements may be needed if you have to use diuretics daily. Daily weights can help you keep track of your progress in clearing your edema. You should call your caregiver for follow up care as recommended. SEEK IMMEDIATE MEDICAL CARE IF:   You have increased swelling, pain, redness, or heat in your legs.  You develop shortness of breath, especially when lying down.  You develop chest or abdominal pain, weakness, or fainting.  You have a fever. Document Released: 11/20/2004 Document Revised: 01/05/2012 Document Reviewed: 10/31/2009 [facility] Patient Information 2015 [place], [place]. This information is not intended to replace advice given to you by your health care provider. Make sure you discuss any questions you have with your health care provider.

## 2014-06-18 NOTE — ED Provider Notes (Signed)
CSN: [id number]     Arrival date & time 06/18/14  2020 History   First MD Initiated Contact with Patient 06/18/14 2048     Chief Complaint  Patient presents with  . Leg Swelling   HPI  History provided by the patient. The patient is a 58 year old female with history of hypertension, CHF, PE, sinus node dysfunction who presents with concerns for increased weight gain and worsening swelling to lower extremities. Patient reports having some chronic issues of swelling to lower extremities. On Friday evening she noticed slightly worsening swelling to the lower legs with mild throbbing pain. On Saturday morning she woke up and reports having a 5 pound weight gain. He states this is very unusual since she has recently been losing weight unexpectedly. He is being worked up for her weight loss with her PCP and rheumatologist. Patient's took 40 mg of Lasix which she does not take regularly and later Saturday evening she was able to have a 4 pound weight loss. A Sunday morning today she had an additional 3 pound weight gain as well as worsening swelling to the lower legs. Patient also complains of some increased fatigue and general ache in the legs and body. She was concerned and came for further evaluation. She denies any associated chest pain or shortness of breath. No recent cough. No fever, chills or sweats.    Past Medical History  Diagnosis Date  . Chest pain   . Anxiety   . Depression   . UTI (urinary tract infection)   . Obesity   . HTN (hypertension)   . Obesity   . Cystitis   . Palpitations   . Lung nodules   . History of urinary tract infection   . Pulmonary embolism     Complicating gastric bypass  . Hx of blood clots     Lungs  . CHF (congestive heart failure)   . Sleep apnea     no cpap now uses 02 2 l at night  . Pacemaker     medtronic  . Sinus node dysfunction 2009     status post placement of    Medtronic pacemaker by Dr. Lewayne Bunting in 2009.   Marland Kitchen Sinoatrial node  dysfunction   . Headache(784.0)   . Motility disorder, esophageal   . Arthritis   . Fibromyalgia   . Thyroid nodule    Past Surgical History  Procedure Laterality Date  . Pacemaker placement      x2, pacemaker leads replaced  . Appendectomy    . Cholecystectomy    . 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 Gastric bypass    . Stomach surgery      "2/3 of stomach removed"  . Abdominal hysterectomy    . Ectopic pregnancy surgery    . Foot surgery      both feet, and ankles  . Knee surgery Bilateral   . Rib resection    . Abdominal surgery      muscle deteriorated in abdomen  . Eus N/A 03/03/2013    Procedure: UPPER ENDOSCOPIC ULTRASOUND (EUS) LINEAR;  Surgeon: Rachael Fee, MD;  Location: [facility];  Service: Endoscopy;  Laterality: N/A;  . Laminectomy     Family History  Problem Relation Age of Onset  . Colon cancer Neg Hx   . Throat cancer Neg Hx   . Esophageal cancer Neg Hx   . Stomach cancer Neg Hx   .  Liver disease Father     PBC  . Kidney disease Neg Hx   . Diabetes Brother     twin  . Heart attack Brother    History  Substance Use Topics  . Smoking status: Never Smoker   . Smokeless tobacco: Never Used  . Alcohol Use: No   OB History   Grav Para Term Preterm Abortions TAB SAB Ect Mult Living                 Review of Systems  Constitutional: Positive for fatigue and unexpected weight change.  Respiratory: Negative for cough and shortness of breath.   Cardiovascular: Positive for leg swelling. Negative for chest pain and palpitations.  Gastrointestinal: Negative for abdominal pain.  All other systems reviewed and are negative.     Allergies  Codeine; Aliskiren fumarate; Bactrim; Eszopiclone; Iodine; Iohexol; Labetalol hcl; Penicillins; Smallpox vaccine; Tetanus toxoids; Topamax; and Vancomycin  Home Medications   Prior to Admission medications   Medication Sig Start Date End Date  Taking? Authorizing Provider  albuterol (PROAIR HFA) 108 (90 BASE) MCG/ACT inhaler Inhale 2 puffs into the lungs every 6 (six) hours as needed for wheezing.    Historical Provider, MD  carisoprodol (SOMA) 350 MG tablet Take 350 mg by mouth daily as needed for muscle spasms.    Historical Provider, MD  cholecalciferol (VITAMIN D) 1000 UNITS tablet Take 1,000 Units by mouth daily.    Historical Provider, MD  clonazePAM (KLONOPIN) 1 MG tablet Take 1 mg by mouth 2 (two) times daily.    Historical Provider, MD  cyanocobalamin 1000 MCG tablet Take 1,000 mcg by mouth daily.     Historical Provider, MD  docusate sodium (COLACE) 50 MG capsule Take 50 mg by mouth 2 (two) times daily.    Historical Provider, MD  metoprolol (LOPRESSOR) 50 MG tablet Take 50 mg by mouth 2 (two) times daily.     Historical Provider, MD  morphine (MS CONTIN) 15 MG 12 hr tablet Take 15 mg by mouth 2 (two) times daily.     Historical Provider, MD  nitrofurantoin (MACRODANTIN) 100 MG capsule Take 100 mg by mouth every evening.    Historical Provider, MD  oxycodone (OXY-IR) 5 MG capsule Take 10 mg by mouth every 4 (four) hours as needed for pain.  04/21/13   Loren Racer, MD  traZODone (DESYREL) 150 MG tablet Take 150 mg by mouth at bedtime.    Historical Provider, MD  warfarin (COUMADIN) 5 MG tablet TAKE AS DIRECTED BY ANTICOAGULATION CLINIC    Duke Salvia, MD   BP 150/98  Pulse 67  Temp(Src) 98.5 F (36.9 C) (Oral)  Resp 16  Ht 5' (1.524 m)  Wt 191 lb (86.637 kg)  BMI 37.30 kg/m2  SpO2 98% Physical Exam  Nursing note and vitals reviewed. Constitutional: She is oriented to person, place, and time. She appears well-developed and well-nourished. No distress.  HENT:  Head: Normocephalic.  Mouth/Throat: Oropharynx is clear and moist.  Eyes: Conjunctivae are normal.  Cardiovascular: Normal rate and regular rhythm.   Murmur heard. Pulmonary/Chest: Effort normal and breath sounds normal. No respiratory distress. She has  no wheezes. She has no rales.  Abdominal: Soft.  Musculoskeletal: Normal range of motion. She exhibits edema.  Mild tenderness to palpation of bilateral ankles and feet. No calf tenderness. Normal range of motion.  Neurological: She is alert and oriented to person, place, and time.  Skin: Skin is warm and dry.  Petechiae to the lower  extremities bilaterally.  Psychiatric: She has a normal mood and affect. Her behavior is normal.    ED Course  Procedures   COORDINATION OF CARE:  Nursing notes reviewed. Vital signs reviewed. Initial pt interview and examination performed.   Filed Vitals:   06/18/14 2026  BP: 150/98  Pulse: 67  Temp: 98.5 F (36.9 C)  TempSrc: Oral  Resp: 16  Height: 5' (1.524 m)  Weight: 191 lb (86.637 kg)  SpO2: 98%    8:54 PM-patient seen and evaluated. Patient resting appears well no acute distress. Denies chest pain or shortness of breath. Normal respirations and O2 sats. Lungs clear. Does have swelling of bilateral lower extremities. Signs of possible petechiae. Patient reports this is chronic although there is slightly more red spots distally.  Laboratory testing without any significant or concerning findings. BNP is elevated at 300 but appears to be near baseline for the patient in the past. Lasix given for her peripheral edema. No respiratory issues. Will plan to have her continue Lasix for the next few days and followup with PCP. She agrees.   Treatment plan initiated: Medications  furosemide (LASIX) tablet 40 mg (40 mg Oral Given 06/18/14 2206)     Results for orders placed during the hospital encounter of 06/18/14  PROTIME-INR      Result Value Ref Range   Prothrombin Time 26.9 (*) 11.6 - 15.2 seconds   INR 2.49 (*) 0.00 - 1.49  CBC WITH DIFFERENTIAL      Result Value Ref Range   WBC 8.3  4.0 - 10.5 K/uL   RBC 3.85 (*) 3.87 - 5.11 MIL/uL   Hemoglobin 12.4  12.0 - 15.0 g/dL   HCT 60.4  54.0 - 98.1 %   MCV 95.1  78.0 - 100.0 fL   MCH 32.2   26.0 - 34.0 pg   MCHC 33.9  30.0 - 36.0 g/dL   RDW 19.1  47.8 - 29.5 %   Platelets 312  150 - 400 K/uL   Neutrophils Relative % 32 (*) 43 - 77 %   Neutro Abs 2.6  1.7 - 7.7 K/uL   Lymphocytes Relative 55 (*) 12 - 46 %   Lymphs Abs 4.6 (*) 0.7 - 4.0 K/uL   Monocytes Relative 12  3 - 12 %   Monocytes Absolute 1.0  0.1 - 1.0 K/uL   Eosinophils Relative 1  0 - 5 %   Eosinophils Absolute 0.1  0.0 - 0.7 K/uL   Basophils Relative 0  0 - 1 %   Basophils Absolute 0.0  0.0 - 0.1 K/uL  PRO B NATRIURETIC PEPTIDE      Result Value Ref Range   Pro B Natriuretic peptide (BNP) 362.1 (*) 0 - 125 pg/mL  BASIC METABOLIC PANEL      Result Value Ref Range   Sodium 142  137 - 147 mEq/L   Potassium 3.7  3.7 - 5.3 mEq/L   Chloride 102  96 - 112 mEq/L   CO2 29  19 - 32 mEq/L   Glucose, Bld 88  70 - 99 mg/dL   BUN 13  6 - 23 mg/dL   Creatinine, Ser 6.21  0.50 - 1.10 mg/dL   Calcium 8.9  8.4 - 30.8 mg/dL   GFR calc non Af Amer >90  >90 mL/min   GFR calc Af Amer >90  >90 mL/min   Anion gap 11  5 - 15     MDM   Final diagnoses:  Bilateral lower extremity  edema        Angus Seller, PA-C 06/18/14 2315

## 2014-06-19 NOTE — ED Provider Notes (Signed)
  Medical screening examination/treatment/procedure(s) were performed by non-physician practitioner and as supervising physician I was immediately available for consultation/collaboration.   EKG Interpretation None         Gerhard Munch, MD 06/19/14 0020

## 2014-06-20 ENCOUNTER — Encounter: Payer: Self-pay | Admitting: Cardiology

## 2014-06-20 ENCOUNTER — Telehealth: Payer: Self-pay | Admitting: *Deleted

## 2014-06-20 ENCOUNTER — Encounter: Payer: Self-pay | Admitting: Physician Assistant

## 2014-06-20 NOTE — Telephone Encounter (Signed)
Susan Canales,  Do you see this patient regularly.  I saw her for a work in visit once when you were not in [place].  Do you want to follow up with her or do you want to have me do it since it's not really an EP problem

## 2014-06-20 NOTE — Telephone Encounter (Signed)
Please call Susan Howell regarding swelling in her feet and legs.

## 2014-06-20 NOTE — Telephone Encounter (Signed)
Patient called and stated she was admitted to the ED after having severe lower extremity swelling She stated she had a weight gain > 7 lbs She denies sob but says the swelling is up and down  She would like to be seen   Scheduled her with Eula Listen PA 06/20/14

## 2014-06-21 ENCOUNTER — Encounter: Payer: Self-pay | Admitting: Physician Assistant

## 2014-06-21 ENCOUNTER — Ambulatory Visit (INDEPENDENT_AMBULATORY_CARE_PROVIDER_SITE_OTHER): Payer: Medicaid Other | Admitting: Physician Assistant

## 2014-06-21 ENCOUNTER — Ambulatory Visit (INDEPENDENT_AMBULATORY_CARE_PROVIDER_SITE_OTHER): Payer: Medicaid Other

## 2014-06-21 VITALS — BP 130/90 | HR 57 | Ht 60.0 in | Wt 191.2 lb

## 2014-06-21 DIAGNOSIS — I2699 Other pulmonary embolism without acute cor pulmonale: Secondary | ICD-10-CM

## 2014-06-21 DIAGNOSIS — R5383 Other fatigue: Secondary | ICD-10-CM

## 2014-06-21 DIAGNOSIS — I495 Sick sinus syndrome: Secondary | ICD-10-CM

## 2014-06-21 DIAGNOSIS — Z7901 Long term (current) use of anticoagulants: Secondary | ICD-10-CM

## 2014-06-21 DIAGNOSIS — Z5181 Encounter for therapeutic drug level monitoring: Secondary | ICD-10-CM

## 2014-06-21 DIAGNOSIS — R600 Localized edema: Secondary | ICD-10-CM

## 2014-06-21 DIAGNOSIS — R5381 Other malaise: Secondary | ICD-10-CM

## 2014-06-21 DIAGNOSIS — R609 Edema, unspecified: Secondary | ICD-10-CM

## 2014-06-21 DIAGNOSIS — I158 Other secondary hypertension: Secondary | ICD-10-CM

## 2014-06-21 DIAGNOSIS — R0602 Shortness of breath: Secondary | ICD-10-CM

## 2014-06-21 LAB — POCT INR: INR: 3

## 2014-06-21 NOTE — Progress Notes (Signed)
[address] [place], [place] [address]  Date:  06/21/2014   Patient ID:  Susan, Howell April 24, 1956, MRN [id number]   PCP:  Max Fickle, MD  Cardiologist:  Dr. Graciela Husbands, MD       Patient Profile: 58 y.o. female followed by Dr. Graciela Husbands for years with history below who presents to clinic today with complaints of weight gain, worsening lower extremity edema, lower extremity pain, and increased fatigue over the past week.       Problem List:  Past Medical History  Diagnosis Date  . Anxiety   . Depression   . Obesity   . HTN (hypertension)   . Obesity   . Cystitis   . Palpitations   . Lung nodules   . History of urinary tract infection   . Pulmonary embolism     a. 2/2 gastric bypass, 1979. b. 12/12/2012  . H/O diastolic dysfunction     a. Echo 12/13/12: EF 65-70%, G2DD, pulm press . b. Echo 02/03/13: 50-55%, no WMA, lef ventricular diastolic function nl, pulm arteries w/in nl range, mild LA dilation.    . Sleep apnea     a. no cpap now uses 02 2L at night  . Pacemaker 2009    a. Dr. Ladona Ridgel. b. Medtronic, SN# [id number], est longevity 10.5 years  . Sinoatrial node dysfunction 2009  . Headache(784.0)   . Motility disorder, esophageal   . Arthritis   . Fibromyalgia   . Thyroid nodule   . Acute stress reaction     Husband passed 2/2 car falling on him that he was working on 2014   . Orthostatic hypotension     History of Present Illness: 58 y.o. female with the above problem list who presents to clinic today with complaint of weight gain and worsening LEE over the past week.   She has had complaints of LEE, leg pain, and weight gain for many years. Most recently, has had echo done 11/2012 revealing EF 65-70%, G2DD, mildly dilated LA, and PA pressure 38 mm Hg. Repeat echo 01/2013 revealed EF 50-55%, no WMA, nl LV diastolic function, mildly dilated LA, PA pressure WNL. Has had negative venous Duplex x 3, most recently 08/04/2013. She has been advised of  salt avoidance and does not adhere to it. She does not get regular exercise. She is on nighttime O2 2/2 large PE 11/2012.   Last PPM check was via remote on 05/29/2014-patient was in sinus rhythm, c/w previous measurements, no ventricular high rate episodes, estimated battery longevity 9.5 years.      Over the past 4 days she has noticed some increased swelling in her lower extremities and on 06/16/14 in the evening she noticed this swelling was slightly worse than it had been previously and was also associated with mild throbbing pain. When she woke up on 8/22 her weight was up 5 pounds from the previous day. This was unusual for her as she has recently been losing weight unexpectedly and is being worked up for this by her PCP and rheumatologist (ANA positive). She took 40 mg of Lasix, which she usually does not take on a regular basis, and weighed herself later in the evening on 8/22. She was noted to be down 4 pounds. On 8/23 she was noted to have gained 3 pounds back from the previous night, as well as increased swelling into her lower legs. She also notes increased fatigue and SOB. This prompted her to go to [facility] on  the evening of 8/23 for evaluation. While there she was found to have a ProBNP 362 (baseline 262-353), K+ 3.7, SCr0.68, WBC 8.3, INR 2.49 (on long term AC 2/2 h/o PE). She received Lasix 40 mg PO x 1 and was discharged to f/u with PCP.   Since her visit to the [facility] she has taken 40 mg Lasix x 1 on 8/24 20 mg Lasix x 1 on 8/25, and none today 2/2 she felt like her legs were back to baseline. She does note some increased SOB that has accompanied that LEE, however never with any chest pain, chest tightness, diaphoresis, nausea, vomiting, presyncope, or syncope. She has had a couple of episodes of palpitations that lasted a few seconds since her last device check on 05/29/14. She sleeps with one pillow at baseline. States she is very active at home and does not sit around. She is no longer on O2  during the day, only at night.           Past Surgical History  Procedure Laterality Date  . Pacemaker placement      x2, pacemaker leads replaced  . Appendectomy    . Cholecystectomy    . 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 Gastric bypass    . Stomach surgery      "2/3 of stomach removed"  . Abdominal hysterectomy    . Ectopic pregnancy surgery    . Foot surgery      both feet, and ankles  . Knee surgery Bilateral   . Rib resection    . Abdominal surgery      muscle deteriorated in abdomen  . Eus N/A 03/03/2013    Procedure: UPPER ENDOSCOPIC ULTRASOUND (EUS) LINEAR;  Surgeon: Milus Banister, MD;  Location: [facility];  Service: Endoscopy;  Laterality: N/A;  . Laminectomy       Allergies:  Allergies  Allergen Reactions  . Iodine Other (See Comments)    seizures  . Labetalol Hcl Hypertension  . Bactrim [Sulfamethoxazole-Trimethoprim] Other (See Comments)    R/T Coumadin  . Eszopiclone Other (See Comments)    REACTION: NIGHT MARES  . Iohexol Nausea And Vomiting and Other (See Comments)     Desc: NAUSEA,VOMITING & SZ S/P CONTRAST INJ 30 YRS AGO// OK W/ PRE MEDS, BENADRYL & SOLUMEDROL TODAY//A.C., Onset Date: [id number]   . Penicillins Nausea And Vomiting    Cannot take tablets by mouth; IV is fine.   . Smallpox Vaccine Other (See Comments)  . Tetanus Toxoids Swelling and Other (See Comments)    Fever, flushing and hardening of the injection site  . Topamax Other (See Comments)    Confusion, high blood pressure.   . Vancomycin Nausea Only    Severe chills  . Aliskiren Fumarate Other (See Comments)    Caused SYNCOPE  . Codeine Itching    Home Medications:  Prior to Admission medications   Medication Sig Start Date End Date Taking? Authorizing Provider  albuterol (PROAIR HFA) 108 (90 BASE) MCG/ACT inhaler Inhale 2 puffs into the lungs every 6 (six) hours as needed for wheezing.   Yes Historical Provider,  MD  carisoprodol (SOMA) 350 MG tablet Take 350 mg by mouth at bedtime as needed for muscle spasms.    Yes Historical Provider, MD  clonazePAM (KLONOPIN) 1 MG tablet Take 1 mg by mouth 2 (two) times daily.   Yes Historical Provider, MD  cyanocobalamin 1000 MCG tablet Take 1,000 mcg  by mouth daily.    Yes Historical Provider, MD  docusate sodium (COLACE) 50 MG capsule Take 100 mg by mouth 2 (two) times daily.    Yes Historical Provider, MD  fluticasone (FLONASE) 50 MCG/ACT nasal spray Place 2 sprays into both nostrils daily.   Yes Historical Provider, MD  furosemide (LASIX) 40 MG tablet Take 40 mg by mouth daily.   Yes Historical Provider, MD  lisinopril (PRINIVIL,ZESTRIL) 5 MG tablet Take 5 mg by mouth daily.   Yes Historical Provider, MD  metoprolol (LOPRESSOR) 50 MG tablet Take 50 mg by mouth 2 (two) times daily.    Yes Historical Provider, MD  morphine (MS CONTIN) 15 MG 12 hr tablet Take 15 mg by mouth 2 (two) times daily.    Yes Historical Provider, MD  nystatin (MYCOSTATIN) 100000 UNIT/ML suspension Take 5 mLs by mouth 4 (four) times daily.   Yes Historical Provider, MD  omeprazole (PRILOSEC) 20 MG capsule Take 20 mg by mouth daily.   Yes Historical Provider, MD  Oxycodone HCl 10 MG TABS Take 10 mg by mouth every 6 (six) hours as needed (for pain).   Yes Historical Provider, MD  polyethylene glycol (MIRALAX / GLYCOLAX) packet Take 17 g by mouth daily.   Yes Historical Provider, MD  promethazine (PHENERGAN) 25 MG tablet Take 25 mg by mouth every 8 (eight) hours as needed for nausea or vomiting.   Yes Historical Provider, MD  traZODone (DESYREL) 150 MG tablet Take 150 mg by mouth at bedtime.   Yes Historical Provider, MD  warfarin (COUMADIN) 5 MG tablet Take 5-7.5 mg by mouth daily at 6 PM. Takes 7.$RemoveBef'5mg'DMHMKqSdYp$  on Wed only Takes $RemoveBe'5mg'ZVtiORUTh$  all other days   Yes Historical Provider, MD     Family History:  The patient's family history includes Diabetes in her brother; Heart attack in her brother; Liver disease  in her father. There is no history of Colon cancer, Throat cancer, Esophageal cancer, Stomach cancer, or Kidney disease.    Social History:  The patient  reports that she has never smoked. She has never used smokeless tobacco. She reports that she does not drink alcohol or use illicit drugs.   Recent Labs: Recent Results (from the past 2160 hour(s))                                                Result Value Ref Range       MDC_IDC_ENUM_SESS_TYPE_REMOTE     Status: None   Collection Time    05/29/14  5:35 PM      Result Value Ref Range   Date Time Interrogation Session [id number]     Pulse Generator Manufacturer Medtronic     Pulse Gen Model [id number] Adapta     Pulse Gen Serial Number [id number]     RV Sense Sensitivity 4     RA Pace Amplitude 3     RV Pace PulseWidth 1     RV Pace Amplitude 2.5     RA Impedance 700     RA Amplitude 2.8     RA Pacing Amplitude 1.5     RA Pacing PulseWidth 0.4     RV IMPEDANCE 760     RV Amplitude 22.4     RV Pacing Amplitude 1.0     RV Pacing PulseWidth 0.4     Battery Status Unknown  Battery Longevity 115     Battery Voltage 2.79     Battery Impedance 332     Brady AP VP Percent 0     Brady AS VP Percent 0     Brady AP VS Percent 8     Brady AS VS Percent 92     Eval Rhythm sinus rhythm     Miscellaneous Comment       Value: Pacemaker remote check. Device function reviewed. Impedance, sensing, auto capture thresholds consistent with previous measurements. Histograms appropriate for patient and level of activity. All other diagnostic data reviewed and is appropriate and      stable for patient. Real time/magnet EGM shows appropriate sensing and capture. No ventricular high rate episodes. 2 mode switch episodes, both less than 30 seconds.  Estimated longevity 9.5 years. Plan [facility] 08-30-14              Result         PROTIME-INR     Status: Abnormal   Collection Time    06/18/14  8:56 PM      Result Value Ref Range    Prothrombin Time 26.9 (*) 11.6 - 15.2 seconds   INR 2.49 (*) 0.00 - 1.49  CBC WITH DIFFERENTIAL     Status: Abnormal   Collection Time    06/18/14  8:56 PM      Result Value Ref Range   WBC 8.3  4.0 - 10.5 K/uL   RBC 3.85 (*) 3.87 - 5.11 MIL/uL   Hemoglobin 12.4  12.0 - 15.0 g/dL   HCT 36.6  36.0 - 46.0 %   MCV 95.1  78.0 - 100.0 fL   MCH 32.2  26.0 - 34.0 pg   MCHC 33.9  30.0 - 36.0 g/dL   RDW 14.8  11.5 - 15.5 %   Platelets 312  150 - 400 K/uL   Neutrophils Relative % 32 (*) 43 - 77 %   Neutro Abs 2.6  1.7 - 7.7 K/uL   Lymphocytes Relative 55 (*) 12 - 46 %   Lymphs Abs 4.6 (*) 0.7 - 4.0 K/uL   Monocytes Relative 12  3 - 12 %   Monocytes Absolute 1.0  0.1 - 1.0 K/uL   Eosinophils Relative 1  0 - 5 %   Eosinophils Absolute 0.1  0.0 - 0.7 K/uL   Basophils Relative 0  0 - 1 %   Basophils Absolute 0.0  0.0 - 0.1 K/uL  PRO B NATRIURETIC PEPTIDE     Status: Abnormal   Collection Time    06/18/14  8:56 PM      Result Value Ref Range   Pro B Natriuretic peptide (BNP) 362.1 (*) 0 - 125 pg/mL  BASIC METABOLIC PANEL     Status: None   Collection Time    06/18/14  8:56 PM      Result Value Ref Range   Sodium 142  137 - 147 mEq/L   Potassium 3.7  3.7 - 5.3 mEq/L   Chloride 102  96 - 112 mEq/L   CO2 29  19 - 32 mEq/L   Glucose, Bld 88  70 - 99 mg/dL   BUN 13  6 - 23 mg/dL   Creatinine, Ser 0.68  0.50 - 1.10 mg/dL   Calcium 8.9  8.4 - 10.5 mg/dL   GFR calc non Af Amer >90  >90 mL/min   GFR calc Af Amer >90  >90 mL/min   Comment: (NOTE)  The eGFR has been calculated using the CKD EPI equation.     This calculation has not been validated in all clinical situations.     eGFR's persistently <90 mL/min signify possible Chronic Kidney     Disease.   Anion gap 11  5 - 15   CBC, CMP, TSH, BNP, Echo all pending  Weights: Filed Weights   06/21/14 1428  Weight: 191 lb 4 oz (86.75 kg)       ROS:  Review of Systems:  Constitutional: positive for weight loss and fatigue.  negative for fevers, chills, mylagias, and night sweats.  HEENT: see above Cardiovascular: positive for palpitations and edema negative for chest pain and orthopnea Respiratory: positive for SOB, DOE, and dyspnea. Negative for cough  Abdominal: negative for abdominal pain, nausea, vomiting, and diarrhea Dermatologic:positive for rash Neurologic: positive for headache. negative for dizziness     All other systems reviewed and negative.    PHYSICAL EXAM:  VS:  BP 130/90  Pulse 57  Ht 5' (1.524 m)  Wt 191 lb 4 oz (86.75 kg)  BMI 37.35 kg/m2 Well nourished, well developed, in no acute distress HEENT: normal Neck: no JVD Cardiac:  normal S1, S2; RRR; no murmur Lungs:  clear to auscultation bilaterally, no wheezing, rhonchi or rales Abd: obese, soft, nontender, no hepatomegaly Ext: Bilateral lower extremities without edema. Mild, improving petechial rash. No erythema, warmth, cording, or swelling bilaterally.  Skin: warm and dry Neuro:  moves all extremities spontaneously, no focal abnormalities noted  EKG:  Sinus bradycardia, 57, RBBB, first degree AV block (unchanged from previous studies-2014)     ASSESSMENT AND PLAN:  1. Lower extremity edema: -She has numerous reasons to have LEE. She had h/o diastolic dysfunction (04/8294) and may have possible acute diastolic HF, has h/o PE on AC (and possible DVT at some point), morbid obesity, inactive, h/o thyroid nodule, among other possible etiologies that are being ruled out with the below screening labs.   -Check TSH, CBC, CMP, Echo (last echo 01/2013: EF 50-55%, no WMA, mild atrial enlargement, PA pressure WNL).    -She has history of PE in the past (1979 & 11/2012), on warfarin. 8/23 INR 2.49, however she has dipped to the 1.9 range, and as low as 1.0 within the past 6 months. Exam not c/w DVT at today's date. Given waxing and waning nature of her symptoms likelihood of DVT is less.   -Last device check 05/29/14, NSR, no ventricular beats,  battery life 9.5 yrs-will have her perform another one this afternoon when she gets home -Salt avoidance-she recently restarted potato chips, having gone through half a bag just prior to the onset of her symptoms -I do not appreciate any edema on exam today. Given this will hold off on addition of medication at this time pending the above results   2. HTN: -BP has been higher than usual, states baseline runs 120s/60s-currently 130/90 -Monitor at this time, as there may be some medication adjustments in the near future   3. Sinoatrial node dysfunction: -S/p Medtronic PPM 2009, see #1 -Next device check scheduled for 08/30/14  4. 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 History of weight loss, has lost 26 pounds over the past 6-8 months: -Positive ANA per patient -Work up per PCP and rheumatologist   6.  Dispo: Follow up with Dr. Acie Fredrickson on 06/30/14 as Dr. Caryl Comes is booked until October.    7. Patient reviewed with Dr. Acie Fredrickson who agrees with the above.    Christell Faith, PA-C 06/21/2014, 4:06 PM

## 2014-06-21 NOTE — Patient Instructions (Signed)
Your physician has requested that you have an echocardiogram. Echocardiography is a painless test that uses sound waves to create images of your heart. It provides your doctor with information about the size and shape of your heart and how well your heart's chambers and valves are working. This procedure takes approximately one hour. There are no restrictions for this procedure.   Your physician recommends that you have labs today: CMP BNP TSH  CBC  Your physician recommends that you schedule a follow-up appointment in:  Dr. Elease Hashimoto 06/30/14 0830 am

## 2014-06-22 ENCOUNTER — Encounter: Payer: Self-pay | Admitting: Cardiology

## 2014-06-23 LAB — CBC WITH DIFFERENTIAL
BASOS ABS: 0 10*3/uL (ref 0.0–0.2)
Basos: 1 %
EOS ABS: 0.1 10*3/uL (ref 0.0–0.4)
Eos: 2 %
HEMATOCRIT: 35.1 % (ref 34.0–46.6)
Hemoglobin: 11.6 g/dL (ref 11.1–15.9)
Immature Grans (Abs): 0 10*3/uL (ref 0.0–0.1)
Immature Granulocytes: 0 %
LYMPHS ABS: 3.3 10*3/uL — AB (ref 0.7–3.1)
LYMPHS: 52 %
MCH: 31.6 pg (ref 26.6–33.0)
MCHC: 33 g/dL (ref 31.5–35.7)
MCV: 96 fL (ref 79–97)
MONOS ABS: 0.8 10*3/uL (ref 0.1–0.9)
Monocytes: 13 %
NEUTROS ABS: 2 10*3/uL (ref 1.4–7.0)
Neutrophils Relative %: 32 %
Platelets: 347 10*3/uL (ref 150–379)
RBC: 3.67 x10E6/uL — ABNORMAL LOW (ref 3.77–5.28)
RDW: 14.6 % (ref 12.3–15.4)
WBC: 6.3 10*3/uL (ref 3.4–10.8)

## 2014-06-23 LAB — COMPREHENSIVE METABOLIC PANEL
ALK PHOS: 60 IU/L (ref 39–117)
ALT: 13 IU/L (ref 0–32)
AST: 20 IU/L (ref 0–40)
Albumin/Globulin Ratio: 1.2 (ref 1.1–2.5)
Albumin: 3.5 g/dL (ref 3.5–5.5)
BILIRUBIN TOTAL: 0.5 mg/dL (ref 0.0–1.2)
BUN/Creatinine Ratio: 20 (ref 9–23)
BUN: 16 mg/dL (ref 6–24)
CHLORIDE: 101 mmol/L (ref 97–108)
CO2: 25 mmol/L (ref 18–29)
Calcium: 8.5 mg/dL — ABNORMAL LOW (ref 8.7–10.2)
Creatinine, Ser: 0.79 mg/dL (ref 0.57–1.00)
GFR calc Af Amer: 95 mL/min/{1.73_m2} (ref >59)
GFR calc non Af Amer: 83 mL/min/{1.73_m2} (ref >59)
Globulin, Total: 2.9 g/dL (ref 1.5–4.5)
Glucose: 100 mg/dL — ABNORMAL HIGH (ref 65–99)
POTASSIUM: 4.6 mmol/L (ref 3.5–5.2)
SODIUM: 142 mmol/L (ref 134–144)
Total Protein: 6.4 g/dL (ref 6.0–8.5)

## 2014-06-23 LAB — TSH: TSH: 1.57 u[IU]/mL (ref 0.450–4.500)

## 2014-06-23 LAB — BRAIN NATRIURETIC PEPTIDE: BNP: 111.8 pg/mL — ABNORMAL HIGH (ref 0.0–100.0)

## 2014-06-26 NOTE — Telephone Encounter (Signed)
phl thanks for gracious offer If you have availability to take that it would be great  On the other hand she has a device and ihave known her for years Thanks

## 2014-06-27 ENCOUNTER — Ambulatory Visit (INDEPENDENT_AMBULATORY_CARE_PROVIDER_SITE_OTHER): Payer: Medicaid Other | Admitting: Pulmonary Disease

## 2014-06-27 ENCOUNTER — Encounter: Payer: Self-pay | Admitting: Pulmonary Disease

## 2014-06-27 VITALS — BP 138/82 | HR 58 | Ht 60.0 in | Wt 181.0 lb

## 2014-06-27 DIAGNOSIS — I2699 Other pulmonary embolism without acute cor pulmonale: Secondary | ICD-10-CM

## 2014-06-27 DIAGNOSIS — J984 Other disorders of lung: Secondary | ICD-10-CM

## 2014-06-27 NOTE — Assessment & Plan Note (Signed)
This very small nodule has remained stable over many years. She needs no further imaging is is favored to be benign.

## 2014-06-27 NOTE — Assessment & Plan Note (Addendum)
She has had 2 pulmonary emboli in her life. She needs lifelong warfarin. I explained this rationale at length today. This can be managed by her primary care physician.

## 2014-06-27 NOTE — Patient Instructions (Signed)
Take lifelong warfarin Follow up with your primary care physician

## 2014-06-27 NOTE — Progress Notes (Signed)
Subjective:    Patient ID: Susan Howell, female    DOB: 06-21-1956, 58 y.o.   MRN: [id number]  Synopsis: Susan Howell first saw the [facility] pulmonary clinic in April 2014 for evaluation of a pulmonary embolism. She had pulmonary embolism in February 2014 which was the second she had had in her lifetime. She had evidence of RV strain after this and was treated with warfarin and Lovenox. She followed up with cardiology who repeated an echocardiogram in April of 2014 which showed resolution of the RV strain. On her initial visit with [facility] she had normal spirometry. She was treated with oxygen by her primary care physician for nocturnal hypoxemia of obesity. In April 2014 she had an episode of bronchitis and had a repeat CT scan of her chest which showed no parenchymal abnormalities other than a 6 mm right upper lobe pulmonary nodule. This also showed resolution of her previously seen pulmonary embolism.  HPI   Her PCP is Dr. Rosita Howell in [facility]  06/27/2014 ROV > Susan Howell has lost a lot of weight since the last visit.  In total she has lost about 80 lbs.  All this started when her husband died.  She notes that she was eating minimal calories (just small snack size rice-a-roni treats and apple sauce) for 3 months or so.  She has been followed with her PCP closely for a rash that started about 8 months ago.  She is supposed to see a dermatologist for this in a few weeks for a biopsy. She had URI last month which she thinks that she contracted from her grandson.  She has also noted increasing leg swelling so she went to the ED.  She took her home dose of lasix and this seemed to help. He appetite has been better lately.  She continues to take her coumadin.  The leg swelling has really improved in the last few weeks on her current dose of lasix. Her proBNP was down a lot today.  She has been more mobile lately and has been able to walk more.      Past Medical History  Diagnosis Date  .  Anxiety   . Depression   . Obesity   . HTN (hypertension)   . Obesity   . Cystitis   . Palpitations   . Lung nodules   . History of urinary tract infection   . Pulmonary embolism     a. 2/2 gastric bypass, 1979. b. 12/12/2012  . H/O diastolic dysfunction     a. Echo 12/13/12: EF 65-70%, G2DD, pulm press . b. Echo 02/03/13: 50-55%, no WMA, lef ventricular diastolic function nl, pulm arteries w/in nl range, mild LA dilation.    . Sleep apnea     a. no cpap now uses 02 2L at night  . Pacemaker 2009    a. Dr. Ladona Ridgel. b. Medtronic, SN# [id number], est longevity 10.5 years  . Sinoatrial node dysfunction 2009  . Headache(784.0)   . Motility disorder, esophageal   . Arthritis   . Fibromyalgia   . Thyroid nodule   . Acute stress reaction     Husband passed 2/2 car falling on him that he was working on 2014   . Orthostatic hypotension      Review of Systems  Constitutional: Positive for fatigue.  Respiratory: Positive for cough, shortness of breath and wheezing.   Cardiovascular: Positive for leg swelling. Negative for chest pain and palpitations.       Objective:  Physical Exam  Filed Vitals:   06/27/14 1549  BP: 138/82  Pulse: 58  Height: 5' (1.524 m)  Weight: 181 lb (82.101 kg)  SpO2: 98%    Gen: chronically ill appearing, morbidly obese HEENT: NCAT, EOMi, OP clear, neck supple without masses PULM: CTA B CV: RRR, no mgr, no JVD AB: BS+, soft, nontender, no hsm Ext: warm, some pitting edema noted, no clubbing, no cyanosis      Assessment & Plan:   PULMONARY EMBOLISM She has had 2 pulmonary emboli in her life. She needs lifelong warfarin. I explained this rationale at length today. This can be managed by her primary care physician.  LUNG NODULE This very small nodule has remained stable over many years. She needs no further imaging is is favored to be benign.  The primary reason why she is following up with me today is because current health flank has had  me listed as her primary care physician incorrectly. She was asked to followup with me regarding her leg swelling and a rash. She has a very good primary care physician at [facility] who is currently managing these problems adequately. She will continue to follow up with her. The patient can followup with me on an as needed basis.  Updated Medication List Outpatient Encounter Prescriptions as of 06/27/2014  Medication Sig  . albuterol (PROAIR HFA) 108 (90 BASE) MCG/ACT inhaler Inhale 2 puffs into the lungs every 6 (six) hours as needed for wheezing.  . carisoprodol (SOMA) 350 MG tablet Take 350 mg by mouth at bedtime as needed for muscle spasms.   . clonazePAM (KLONOPIN) 1 MG tablet Take 1 mg by mouth 2 (two) times daily.  . cyanocobalamin 1000 MCG tablet Take 1,000 mcg by mouth daily.   Marland Kitchen docusate sodium (COLACE) 50 MG capsule Take 100 mg by mouth 2 (two) times daily.   . fluticasone (FLONASE) 50 MCG/ACT nasal spray Place 2 sprays into both nostrils daily.  . furosemide (LASIX) 40 MG tablet Take 20 mg by mouth daily.   Marland Kitchen lisinopril (PRINIVIL,ZESTRIL) 5 MG tablet Take 5 mg by mouth daily.  . metoprolol (LOPRESSOR) 50 MG tablet Take 50 mg by mouth 2 (two) times daily.   Marland Kitchen morphine (MS CONTIN) 15 MG 12 hr tablet Take 15 mg by mouth 2 (two) times daily.   Marland Kitchen nystatin (MYCOSTATIN) 100000 UNIT/ML suspension Take 5 mLs by mouth 4 (four) times daily.  Marland Kitchen omeprazole (PRILOSEC) 20 MG capsule Take 20 mg by mouth daily.  . Oxycodone HCl 10 MG TABS Take 10 mg by mouth every 6 (six) hours as needed (for pain).  . polyethylene glycol (MIRALAX / GLYCOLAX) packet Take 17 g by mouth daily.  . promethazine (PHENERGAN) 25 MG tablet Take 25 mg by mouth every 8 (eight) hours as needed for nausea or vomiting.  . traZODone (DESYREL) 150 MG tablet Take 150 mg by mouth at bedtime.  Marland Kitchen warfarin (COUMADIN) 5 MG tablet Take 5-7.5 mg by mouth daily at 6 PM. Takes 7.5mg  on Wed only Takes  all other days

## 2014-06-28 ENCOUNTER — Encounter: Payer: Self-pay | Admitting: Internal Medicine

## 2014-06-30 ENCOUNTER — Ambulatory Visit: Payer: Medicaid Other | Admitting: Cardiovascular Disease

## 2014-07-04 ENCOUNTER — Other Ambulatory Visit (INDEPENDENT_AMBULATORY_CARE_PROVIDER_SITE_OTHER): Payer: Medicaid Other

## 2014-07-04 ENCOUNTER — Other Ambulatory Visit: Payer: Self-pay

## 2014-07-04 ENCOUNTER — Ambulatory Visit (INDEPENDENT_AMBULATORY_CARE_PROVIDER_SITE_OTHER): Payer: Medicaid Other | Admitting: Pharmacist

## 2014-07-04 DIAGNOSIS — R609 Edema, unspecified: Secondary | ICD-10-CM

## 2014-07-04 DIAGNOSIS — Z7901 Long term (current) use of anticoagulants: Secondary | ICD-10-CM

## 2014-07-04 DIAGNOSIS — I369 Nonrheumatic tricuspid valve disorder, unspecified: Secondary | ICD-10-CM

## 2014-07-04 DIAGNOSIS — Z5181 Encounter for therapeutic drug level monitoring: Secondary | ICD-10-CM

## 2014-07-04 DIAGNOSIS — R0602 Shortness of breath: Secondary | ICD-10-CM

## 2014-07-04 LAB — POCT INR: INR: 2

## 2014-07-10 ENCOUNTER — Ambulatory Visit: Payer: Medicaid Other | Admitting: Cardiovascular Disease

## 2014-08-06 ENCOUNTER — Other Ambulatory Visit: Payer: Self-pay | Admitting: Internal Medicine

## 2014-08-09 ENCOUNTER — Ambulatory Visit (INDEPENDENT_AMBULATORY_CARE_PROVIDER_SITE_OTHER): Payer: Medicaid Other

## 2014-08-09 DIAGNOSIS — Z7901 Long term (current) use of anticoagulants: Secondary | ICD-10-CM

## 2014-08-09 DIAGNOSIS — Z5181 Encounter for therapeutic drug level monitoring: Secondary | ICD-10-CM

## 2014-08-09 LAB — POCT INR: INR: 2.5

## 2014-08-29 ENCOUNTER — Telehealth: Payer: Self-pay

## 2014-08-29 DIAGNOSIS — K869 Disease of pancreas, unspecified: Secondary | ICD-10-CM

## 2014-08-29 NOTE — Telephone Encounter (Signed)
-----   Message from Donata DuffPatty L Lewis, [place] sent at 11/29/2013  2:07 PM EST ----- Repeat CT scan in 08/2014 for pancreatic abnormality on CT scan.

## 2014-08-30 ENCOUNTER — Encounter: Payer: Medicaid Other | Admitting: *Deleted

## 2014-08-30 ENCOUNTER — Observation Stay (HOSPITAL_COMMUNITY)
Admission: EM | Admit: 2014-08-30 | Discharge: 2014-09-01 | Disposition: A | Discharge status: Home / Self Care | Payer: Medicaid Other | Attending: Family Medicine | Admitting: Family Medicine

## 2014-08-30 ENCOUNTER — Encounter (HOSPITAL_COMMUNITY): Payer: Self-pay | Admitting: *Deleted

## 2014-08-30 ENCOUNTER — Telehealth: Payer: Self-pay | Admitting: Cardiology

## 2014-08-30 DIAGNOSIS — W2209XA Striking against other stationary object, initial encounter: Secondary | ICD-10-CM | POA: Insufficient documentation

## 2014-08-30 DIAGNOSIS — W08XXXA Fall from other furniture, initial encounter: Secondary | ICD-10-CM | POA: Insufficient documentation

## 2014-08-30 DIAGNOSIS — I10 Essential (primary) hypertension: Secondary | ICD-10-CM | POA: Diagnosis not present

## 2014-08-30 DIAGNOSIS — Z86711 Personal history of pulmonary embolism: Secondary | ICD-10-CM | POA: Insufficient documentation

## 2014-08-30 DIAGNOSIS — Z79899 Other long term (current) drug therapy: Secondary | ICD-10-CM | POA: Insufficient documentation

## 2014-08-30 DIAGNOSIS — R0781 Pleurodynia: Secondary | ICD-10-CM | POA: Insufficient documentation

## 2014-08-30 DIAGNOSIS — Y92003 Bedroom of unspecified non-institutional (private) residence as the place of occurrence of the external cause: Secondary | ICD-10-CM | POA: Insufficient documentation

## 2014-08-30 DIAGNOSIS — Z7901 Long term (current) use of anticoagulants: Secondary | ICD-10-CM | POA: Diagnosis not present

## 2014-08-30 DIAGNOSIS — Z79891 Long term (current) use of opiate analgesic: Secondary | ICD-10-CM | POA: Diagnosis not present

## 2014-08-30 DIAGNOSIS — G459 Transient cerebral ischemic attack, unspecified: Principal | ICD-10-CM | POA: Diagnosis present

## 2014-08-30 DIAGNOSIS — H8111 Benign paroxysmal vertigo, right ear: Secondary | ICD-10-CM | POA: Insufficient documentation

## 2014-08-30 DIAGNOSIS — Z6835 Body mass index (BMI) 35.0-35.9, adult: Secondary | ICD-10-CM | POA: Diagnosis not present

## 2014-08-30 DIAGNOSIS — Z7952 Long term (current) use of systemic steroids: Secondary | ICD-10-CM | POA: Insufficient documentation

## 2014-08-30 DIAGNOSIS — F319 Bipolar disorder, unspecified: Secondary | ICD-10-CM | POA: Diagnosis not present

## 2014-08-30 DIAGNOSIS — W19XXXA Unspecified fall, initial encounter: Secondary | ICD-10-CM

## 2014-08-30 DIAGNOSIS — R0902 Hypoxemia: Secondary | ICD-10-CM | POA: Diagnosis not present

## 2014-08-30 DIAGNOSIS — R471 Dysarthria and anarthria: Secondary | ICD-10-CM | POA: Diagnosis present

## 2014-08-30 DIAGNOSIS — Z950 Presence of cardiac pacemaker: Secondary | ICD-10-CM | POA: Diagnosis not present

## 2014-08-30 DIAGNOSIS — H8110 Benign paroxysmal vertigo, unspecified ear: Secondary | ICD-10-CM

## 2014-08-30 DIAGNOSIS — R410 Disorientation, unspecified: Secondary | ICD-10-CM | POA: Diagnosis present

## 2014-08-30 DIAGNOSIS — I2699 Other pulmonary embolism without acute cor pulmonale: Secondary | ICD-10-CM | POA: Diagnosis present

## 2014-08-30 DIAGNOSIS — E669 Obesity, unspecified: Secondary | ICD-10-CM | POA: Diagnosis present

## 2014-08-30 DIAGNOSIS — G4736 Sleep related hypoventilation in conditions classified elsewhere: Secondary | ICD-10-CM

## 2014-08-30 DIAGNOSIS — I5032 Chronic diastolic (congestive) heart failure: Secondary | ICD-10-CM | POA: Diagnosis not present

## 2014-08-30 LAB — I-STAT CHEM 8, ED
BUN: 18 mg/dL (ref 6–23)
CALCIUM ION: 1.15 mmol/L (ref 1.12–1.23)
CHLORIDE: 102 meq/L (ref 96–112)
Creatinine, Ser: 0.8 mg/dL (ref 0.50–1.10)
Glucose, Bld: 82 mg/dL (ref 70–99)
HCT: 41 % (ref 36.0–46.0)
Hemoglobin: 13.9 g/dL (ref 12.0–15.0)
Potassium: 4.1 mEq/L (ref 3.7–5.3)
SODIUM: 143 meq/L (ref 137–147)
TCO2: 26 mmol/L (ref 0–100)

## 2014-08-30 LAB — CBC WITH DIFFERENTIAL/PLATELET
BASOS ABS: 0 10*3/uL (ref 0.0–0.1)
Basophils Relative: 1 % (ref 0–1)
EOS PCT: 1 % (ref 0–5)
Eosinophils Absolute: 0.1 10*3/uL (ref 0.0–0.7)
HEMATOCRIT: 38.2 % (ref 36.0–46.0)
HEMOGLOBIN: 12.5 g/dL (ref 12.0–15.0)
LYMPHS PCT: 66 % — AB (ref 12–46)
Lymphs Abs: 5.1 10*3/uL — ABNORMAL HIGH (ref 0.7–4.0)
MCH: 32.5 pg (ref 26.0–34.0)
MCHC: 32.7 g/dL (ref 30.0–36.0)
MCV: 99.2 fL (ref 78.0–100.0)
MONO ABS: 0.9 10*3/uL (ref 0.1–1.0)
MONOS PCT: 12 % (ref 3–12)
NEUTROS ABS: 1.5 10*3/uL — AB (ref 1.7–7.7)
Neutrophils Relative %: 20 % — ABNORMAL LOW (ref 43–77)
Platelets: 283 10*3/uL (ref 150–400)
RBC: 3.85 MIL/uL — ABNORMAL LOW (ref 3.87–5.11)
RDW: 14.1 % (ref 11.5–15.5)
WBC: 7.6 10*3/uL (ref 4.0–10.5)

## 2014-08-30 NOTE — ED Notes (Signed)
Pt arrives via EMS from home. States that she fell on Sunday and did hit her head. States that she is unsure if she lost consciousness. States that she is on a blood thinner. C/o back pain, head pain, rib pain, dizzyness, unsteadiness, nausea and a feeling of blacking out when she stands up. States that she has a hx of stroke and bleeds. States that she was recently treated for an ear infection. Pt has pacer EMS VS 130/90 HR 61 NSR 97% RA CNG 94.

## 2014-08-30 NOTE — ED Provider Notes (Signed)
CSN: [id number]     Arrival date & time 08/30/14  2308 History   First MD Initiated Contact with Patient 08/30/14 2323     Chief Complaint  Patient presents with  . Weakness  . Fall     (Consider location/radiation/quality/duration/timing/severity/associated sxs/prior Treatment) The history is provided by the patient. No language interpreter was used.  Susan Howell is a 58 y/o F with PMHx of anxiety, GERD, hypertension, PE on Coumadin, pacemaker, sleep apnea presenting to the emergency department to be checked out regarding a fall that occurred Sunday evening. Patient reported that when she was getting into bed, she had sudden onset of dizziness resulting in her to fall backwards hitting her head on the [place] doors. Stated that she felt as if she was almost going to have loss of consciousness, but denied LOC. Reported that the next morning her fianc reported that she was having difficulty with speech, difficulty forming words. States that she's been having intermittent blurred vision. Reported that she's been having issues with memory, reporting that she is going into a room and forgetting why she entered this room. Stated that she's been having intermittent headaches. Reported that she's been having the sensation of unsteady gait with walking. Stated that she's been feeling nauseous. Patient reported that she has been easily confused. She also reported that approximate 4-5 days prior to this fall she went to grab something up high and stated that she heard a pop on her right side-stated that she's been having right rib pain ever since. Denied sudden loss of vision, vomiting, diarrhea, inability to control urine or bowel movements, NSAID use, abdominal pain. PCP Dr. Darnelle SpangleShana Ratner Neurosurgeon Dr. Wynetta Emeryram   Past Medical History  Diagnosis Date  . Anxiety   . Depression   . Obesity   . HTN (hypertension)   . Obesity   . Cystitis   . Palpitations   . Lung nodules   . History of urinary tract  infection   . Pulmonary embolism     a. 2/2 gastric bypass, 1979. b. 12/12/2012  . H/O diastolic dysfunction     a. Echo 12/13/12: EF 65-70%, G2DD, pulm press 38mmHg. b. Echo 02/03/13: 50-55%, no WMA, lef ventricular diastolic function nl, pulm arteries w/in nl range, mild LA dilation.    . Sleep apnea     a. no cpap now uses 02 2L at night  . Pacemaker 2009    a. Dr. Ladona Ridgelaylor. b. [employer], SN# [id number] H, est longevity 10.5 years  . Sinoatrial node dysfunction 2009  . Headache(784.0)   . Motility disorder, esophageal   . Arthritis   . Fibromyalgia   . Thyroid nodule   . Acute stress reaction     Husband passed 2/2 car falling on him that he was working on 2014   . Orthostatic hypotension    Past Surgical History  Procedure Laterality Date  . Pacemaker placement      x2, pacemaker leads replaced  . Appendectomy    . Cholecystectomy    . 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 Gastric bypass    . Stomach surgery      "2/3 of stomach removed"  . Abdominal hysterectomy    . Ectopic pregnancy surgery    . Foot surgery      both feet, and ankles  . Knee surgery Bilateral   . Rib resection    . Abdominal surgery  muscle deteriorated in abdomen  . Eus N/A 03/03/2013    Procedure: UPPER ENDOSCOPIC ULTRASOUND (EUS) LINEAR;  Surgeon: Rachael Fee, MD;  Location: [facility];  Service: Endoscopy;  Laterality: N/A;  . Laminectomy     Family History  Problem Relation Age of Onset  . Colon cancer Neg Hx   . Throat cancer Neg Hx   . Esophageal cancer Neg Hx   . Stomach cancer Neg Hx   . Liver disease Father     PBC  . Kidney disease Neg Hx   . Diabetes Brother     twin  . Heart attack Brother    History  Substance Use Topics  . Smoking status: Never Smoker   . Smokeless tobacco: Never Used  . Alcohol Use: No   OB History    No data available     Review of Systems  Constitutional: Negative for fever and chills.   Eyes: Positive for photophobia and visual disturbance. Negative for pain.  Respiratory: Negative for chest tightness and shortness of breath.   Cardiovascular: Negative for chest pain.  Gastrointestinal: Positive for nausea. Negative for vomiting and abdominal pain.  Musculoskeletal: Positive for arthralgias (left hip pain ) and neck pain.  Neurological: Positive for headaches. Negative for weakness and numbness.      Allergies  Iodine; Labetalol hcl; Bactrim; Eszopiclone; Iohexol; Penicillins; Smallpox vaccine; Tetanus toxoids; Topamax; Vancomycin; Aliskiren fumarate; and Codeine  Home Medications   Prior to Admission medications   Medication Sig Start Date End Date Taking? Authorizing Provider  carisoprodol (SOMA) 350 MG tablet Take 350 mg by mouth at bedtime as needed for muscle spasms.    Yes Historical Provider, MD  clonazePAM (KLONOPIN) 1 MG tablet Take 1 mg by mouth 2 (two) times daily.   Yes Historical Provider, MD  docusate sodium (COLACE) 50 MG capsule Take 100 mg by mouth 2 (two) times daily.    Yes Historical Provider, MD  fluticasone (FLONASE) 50 MCG/ACT nasal spray Place 2 sprays into both nostrils daily.   Yes Historical Provider, MD  furosemide (LASIX) 40 MG tablet Take 10 mg by mouth 2 (two) times a week.    Yes Historical Provider, MD  lisinopril (PRINIVIL,ZESTRIL) 5 MG tablet Take 2.5 mg by mouth as needed (for extremely elevated blood pressures).    Yes Historical Provider, MD  metoprolol (LOPRESSOR) 50 MG tablet Take 50 mg by mouth 2 (two) times daily.    Yes Historical Provider, MD  morphine (MS CONTIN) 15 MG 12 hr tablet Take 15 mg by mouth 2 (two) times daily.    Yes Historical Provider, MD  nystatin (MYCOSTATIN) 100000 UNIT/ML suspension Take 5 mLs by mouth 4 (four) times daily as needed (takes when taking antibiotics).    Yes Historical Provider, MD  omeprazole (PRILOSEC) 20 MG capsule Take 20 mg by mouth every Monday, Wednesday, and Friday.    Yes Historical  Provider, MD  Oxycodone HCl 10 MG TABS Take 10 mg by mouth every 6 (six) hours as needed (for pain).   Yes Historical Provider, MD  polyethylene glycol (MIRALAX / GLYCOLAX) packet Take 17 g by mouth daily.   Yes Historical Provider, MD  promethazine (PHENERGAN) 25 MG tablet Take 25 mg by mouth every 8 (eight) hours as needed for nausea or vomiting.   Yes Historical Provider, MD  traZODone (DESYREL) 150 MG tablet Take 150 mg by mouth at bedtime.   Yes Historical Provider, MD  warfarin (COUMADIN) 5 MG tablet Take 5-7.5 mg by  mouth daily. Takes 5mg  (1tablet) daily except Wednesday then takes 7.5mg  (1 and half tablets)   Yes Historical Provider, MD  albuterol (PROAIR HFA) 108 (90 BASE) MCG/ACT inhaler Inhale 2 puffs into the lungs every 6 (six) hours as needed for wheezing.    Historical Provider, MD  cyanocobalamin 1000 MCG tablet Take 1,000 mcg by mouth daily.     Historical Provider, MD  warfarin (COUMADIN) 5 MG tablet TAKE AS DIRECTED BY ANTICOAGULATION CLINIC 08/09/14   Duke Salvia, MD   BP 159/91 mmHg  Pulse 59  Temp(Src) 98 F (36.7 C) (Oral)  Resp 13  Ht 5' (1.524 m)  Wt 189 lb (85.73 kg)  BMI 36.91 kg/m2  SpO2 99% Physical Exam  Constitutional: She is oriented to person, place, and time. She appears well-developed and well-nourished. No distress.  HENT:  Head: Normocephalic and atraumatic.  Mouth/Throat: Oropharynx is clear and moist. No oropharyngeal exudate.  Negative lacerations Negative facial trauma Negative palpation of hematomas Negative palpation of crepitus depressions in the skull or scalp  Eyes: Conjunctivae and EOM are normal. Pupils are equal, round, and reactive to light. Right eye exhibits no discharge. Left eye exhibits no discharge.  Mild horizontal nystagmus Visual fields grossly intact EOMs intact-negative signs of entrapment  Neck: Normal range of motion. Neck supple. No tracheal deviation present.  Negative neck stiffness Negative pain upon palpation to  the c-spine  Cardiovascular: Normal rate, regular rhythm and normal heart sounds.  Exam reveals no friction rub.   No murmur heard. Pulses:      Radial pulses are 2+ on the right side, and 2+ on the left side.       Dorsalis pedis pulses are 2+ on the right side, and 2+ on the left side.  Cap refill less than 3 seconds  Pulmonary/Chest: Effort normal and breath sounds normal. No respiratory distress. She has no wheezes. She has no rales. She exhibits tenderness.  Patient is able to speak in full sentences without difficulty Negative use of accessory muscles Negative stridor Discomfort upon palpation to the anterior aspect of the right ribs, midclavicular region. Negative crepitus upon palpation. Negative ecchymosis. Negative tenting of the skin.  Musculoskeletal: Normal range of motion. She exhibits tenderness.  Negative swelling, deformities identified to the spine. Discomfort on palpation to the mid thoracic and lumbosacral spine, paravertebral regions bilaterally. Full range of motion to the lower extremities bilaterally without difficulty or ataxia. Ecchymosis identified to the acetabulum region of the left [place] discomfort upon palpation. Negative crepitus. Full range of motion without difficulty or ataxia.    Lymphadenopathy:    She has no cervical adenopathy.  Neurological: She is alert and oriented to person, place, and time. No cranial nerve deficit. She exhibits normal muscle tone. Coordination normal.  Cranial nerves III-XII grossly intact Strength 5+/5+ to upper and lower extremities bilaterally with resistance applied, equal distribution noted Equal grip strength bilaterally  Negative facial droop Negative slurred speech Negative for aphasia Sensation intact to differentiation to sharp and dull touch 2 point discrimination identified to the hands and feet bilaterally Negative arm drift Patient is able to bring finger to nose bilaterally without difficulty or ataxia Heel  to knee down shin normal bilaterally Patient follows commands well  Patient responds to questions appropriately   Skin: Skin is warm and dry. No rash noted. She is not diaphoretic. No erythema.  Psychiatric: She has a normal mood and affect. Her behavior is normal. Thought content normal.  Nursing note and vitals  reviewed.   ED Course  Procedures (including critical care time)  Results for orders placed or performed during the hospital encounter of 08/30/14  Troponin I  Result Value Ref Range   Troponin I <0.30 <0.30 ng/mL  CBC with Differential  Result Value Ref Range   WBC 7.6 4.0 - 10.5 K/uL   RBC 3.85 (L) 3.87 - 5.11 MIL/uL   Hemoglobin 12.5 12.0 - 15.0 g/dL   HCT 16.138.2 09.636.0 - 04.546.0 %   MCV 99.2 78.0 - 100.0 fL   MCH 32.5 26.0 - 34.0 pg   MCHC 32.7 30.0 - 36.0 g/dL   RDW 40.914.1 81.111.5 - 91.415.5 %   Platelets 283 150 - 400 K/uL   Neutrophils Relative % 20 (L) 43 - 77 %   Neutro Abs 1.5 (L) 1.7 - 7.7 K/uL   Lymphocytes Relative 66 (H) 12 - 46 %   Lymphs Abs 5.1 (H) 0.7 - 4.0 K/uL   Monocytes Relative 12 3 - 12 %   Monocytes Absolute 0.9 0.1 - 1.0 K/uL   Eosinophils Relative 1 0 - 5 %   Eosinophils Absolute 0.1 0.0 - 0.7 K/uL   Basophils Relative 1 0 - 1 %   Basophils Absolute 0.0 0.0 - 0.1 K/uL  Comprehensive metabolic panel  Result Value Ref Range   Sodium 143 137 - 147 mEq/L   Potassium 4.3 3.7 - 5.3 mEq/L   Chloride 104 96 - 112 mEq/L   CO2 28 19 - 32 mEq/L   Glucose, Bld 82 70 - 99 mg/dL   BUN 17 6 - 23 mg/dL   Creatinine, Ser 7.820.74 0.50 - 1.10 mg/dL   Calcium 8.9 8.4 - 95.610.5 mg/dL   Total Protein 7.2 6.0 - 8.3 g/dL   Albumin 3.1 (L) 3.5 - 5.2 g/dL   AST 19 0 - 37 U/L   ALT 14 0 - 35 U/L   Alkaline Phosphatase 74 39 - 117 U/L   Total Bilirubin 0.3 0.3 - 1.2 mg/dL   GFR calc non Af Amer >90 >90 mL/min   GFR calc Af Amer >90 >90 mL/min   Anion gap 11 5 - 15  Protime-INR  Result Value Ref Range   Prothrombin Time 23.3 (H) 11.6 - 15.2 seconds   INR 2.05 (H) 0.00 -  1.49  I-Stat Chem 8, ED  Result Value Ref Range   Sodium 143 137 - 147 mEq/L   Potassium 4.1 3.7 - 5.3 mEq/L   Chloride 102 96 - 112 mEq/L   BUN 18 6 - 23 mg/dL   Creatinine, Ser 2.130.80 0.50 - 1.10 mg/dL   Glucose, Bld 82 70 - 99 mg/dL   Calcium, Ion 0.861.15 5.781.12 - 1.23 mmol/L   TCO2 26 0 - 100 mmol/L   Hemoglobin 13.9 12.0 - 15.0 g/dL   HCT 46.941.0 62.936.0 - 52.846.0 %    Labs Review Labs Reviewed  CBC WITH DIFFERENTIAL - Abnormal; Notable for the following:    RBC 3.85 (*)    Neutrophils Relative % 20 (*)    Neutro Abs 1.5 (*)    Lymphocytes Relative 66 (*)    Lymphs Abs 5.1 (*)    All other components within normal limits  COMPREHENSIVE METABOLIC PANEL - Abnormal; Notable for the following:    Albumin 3.1 (*)    All other components within normal limits  PROTIME-INR - Abnormal; Notable for the following:    Prothrombin Time 23.3 (*)    INR 2.05 (*)    All  other components within normal limits  TROPONIN I  URINALYSIS, ROUTINE W REFLEX MICROSCOPIC  URINE RAPID DRUG SCREEN (HOSP PERFORMED)  I-STAT CHEM 8, ED    Imaging Review Dg Ribs Unilateral W/chest Right  08/31/2014   CLINICAL DATA:  Fall, back pain and RIGHT rib pain. Syncope and fall on Sunday, possible loss of consciousness. On blood thinner. History of stroke.  EXAM: RIGHT RIBS AND CHEST - 3+ VIEW  COMPARISON:  Chest radiograph December 30, 2013  FINDINGS: Cardiac silhouette is upper limits of normal in size. Mediastinal silhouette is unremarkable. Dual lead LEFT cardiac pacemaker in situ. Mildly elevated LEFT hemidiaphragm. RIGHT hepatic eventration. Mild central pulmonary vascular congestion without pleural effusions or focal consolidations. No pneumothorax.  No rib fracture deformity. LEFT thoracotomy. Thoracolumbar posterior instrumentation. Surgical clips in the included right abdomen likely reflect cholecystectomy.  IMPRESSION: Borderline cardiomegaly and central pulmonary vascular congestion.  No rib fracture deformity.    Electronically Signed   By: Awilda Metro   On: 08/31/2014 01:21   Dg Thoracic Spine W/swimmers  08/31/2014   CLINICAL DATA:  Fall, back pain. Syncope and fall on Sunday, possible loss of consciousness. On blood thinner. History of stroke.  EXAM: THORACIC SPINE - 2 VIEW + SWIMMERS  COMPARISON:  CT of the chest December 30, 2013  FINDINGS: Nondiagnostic swimmer's view. There is no evidence of thoracic spine fracture though, patient is osteopenic which decreases sensitivity. Alignment is normal. Thoracolumbar posterior instrumentation transfixing proximate L2 burst fracture. No other significant bone abnormalities are identified. Cardiac pacemaker in situ, which may be a contraindication to MRI.  IMPRESSION: No acute fracture deformity or malalignment. Patient is osteopenic which decreases sensitivity for acute nondisplaced fractures.   Electronically Signed   By: Awilda Metro   On: 08/31/2014 01:23   Dg Lumbar Spine Complete  08/31/2014   CLINICAL DATA:  Low back pain and upper back pain after a fall. Pain anterior right ribs.  EXAM: LUMBAR SPINE - COMPLETE 4+ VIEW  COMPARISON:  03/28/2014  FINDINGS: There is an old compression fracture of the L1 vertebra with posterior rod and screw fixation from T11 through L3. No significant change in hardware position. Lumbar kyphosis centered at L1 without significant change. Degenerative changes throughout the lumbar spine with narrowed lumbar interspaces and associated endplate hypertrophic changes. Diffuse bone demineralization. No acute fracture deformities are demonstrated. Surgical clips in the upper abdomen.  IMPRESSION: Old fracture of L1 with posterior rod and screw fixation from T11 through L3. Diffuse bone demineralization and degenerative changes. No acute displaced fractures appreciated.   Electronically Signed   By: Burman Nieves M.D.   On: 08/31/2014 01:13   Dg Hip Complete Left  08/31/2014   CLINICAL DATA:  Fall, back pain. Syncope and fall on  Sunday, possible loss of consciousness. On blood thinner. History of stroke.  EXAM: LEFT HIP - COMPLETE 2+ VIEW  COMPARISON:  None.  FINDINGS: There is no evidence of hip fracture or dislocation. Phleboliths in the pelvis. There is no evidence of arthropathy or other focal bone abnormality. Partially imaged lumbar instrumentation.  IMPRESSION: Negative.   Electronically Signed   By: Awilda Metro   On: 08/31/2014 01:19   Ct Head Wo Contrast  08/31/2014   CLINICAL DATA:  Recent fall and complains of back and head pain.  EXAM: CT HEAD WITHOUT CONTRAST  CT CERVICAL SPINE WITHOUT CONTRAST  TECHNIQUE: Multidetector CT imaging of the head and cervical spine was performed following the standard protocol without intravenous contrast.  Multiplanar CT image reconstructions of the cervical spine were also generated.  COMPARISON:  11/20/2009  FINDINGS: CT HEAD FINDINGS  No evidence for acute hemorrhage, mass lesion, midline shift, hydrocephalus or large infarct. Paranasal sinuses are clear. No acute bone abnormality. The entire vertex was not imaged.  CT CERVICAL SPINE FINDINGS  Negative for a fracture or dislocation. No evidence for soft tissue swelling in the neck. Lung apices are clear. The facets are located. Alignment of the cervical spine is normal. The vertebral body heights are maintained.  IMPRESSION: No acute intracranial abnormality.  No acute bone abnormality in the cervical spine.   Electronically Signed   By: Richarda Overlie M.D.   On: 08/31/2014 01:11   Ct Cervical Spine Wo Contrast  08/31/2014   CLINICAL DATA:  Recent fall and complains of back and head pain.  EXAM: CT HEAD WITHOUT CONTRAST  CT CERVICAL SPINE WITHOUT CONTRAST  TECHNIQUE: Multidetector CT imaging of the head and cervical spine was performed following the standard protocol without intravenous contrast. Multiplanar CT image reconstructions of the cervical spine were also generated.  COMPARISON:  11/20/2009  FINDINGS: CT HEAD FINDINGS  No  evidence for acute hemorrhage, mass lesion, midline shift, hydrocephalus or large infarct. Paranasal sinuses are clear. No acute bone abnormality. The entire vertex was not imaged.  CT CERVICAL SPINE FINDINGS  Negative for a fracture or dislocation. No evidence for soft tissue swelling in the neck. Lung apices are clear. The facets are located. Alignment of the cervical spine is normal. The vertebral body heights are maintained.  IMPRESSION: No acute intracranial abnormality.  No acute bone abnormality in the cervical spine.   Electronically Signed   By: Richarda Overlie M.D.   On: 08/31/2014 01:11     EKG Interpretation   Date/Time:  Wednesday August 30 2014 23:24:02 EST Ventricular Rate:  76 PR Interval:    QRS Duration: 149 QT Interval:  463 QTC Calculation: 521 R Axis:   73 Text Interpretation:  Normal sinus rhythm Right bundle branch block P  waves less prominent, but regular rhythm Confirmed by Rhunette Croft, MD, Janey Genta  [phone]) on 08/31/2014 2:01:09 AM       2:18 Am This provider spoke with Dr. Allena Katz, [facility] - discussed case, labs, imaging, vitals in great detail. Patient to be admitted to Telemetry for further work up.   MDM   Final diagnoses:  Fall  Transient cerebral ischemia, unspecified transient cerebral ischemia type    Medications  sodium chloride 0.9 % bolus 1,000 mL (1,000 mLs Intravenous New Bag/Given 08/31/14 0024)   Filed Vitals:   08/31/14 0125 08/31/14 0130 08/31/14 0131 08/31/14 0145  BP: 109/96 136/82 159/91   Pulse: 61 67 60 59  Temp:      TempSrc:      Resp: 14   13  Height:      Weight:      SpO2:    99%   EKG noted normal sinus rhythm with a right bundle branch block with a heart rate of 76 bpm. Troponin negative elevation. PT 23.3, INR 2.05-elevated secondary to patient being on Coumadin. CBC unremarkable. CMP unremarkable.plain film of lumbar spine noted old fracture of L1 with posterior rod and screw fixation from T11-L3 disc with diffuse bone  demineralization and degenerative changes-negative for acute osseous abnormalities. Left hip plain film unremarkable. Plain film of right chest negative for acute bony abnormalities. Plain film of thoracic spine no acute abnormalities identified.CT head without contrast negative for acute intercranial abnormalities.  CT cervical spine unremarkable for acute osseous injury. Patient presenting to the ED with a fall that occurred Sunday resulting in confusion, speech difficulty, headaches. Unremarkable exam. Labs and imaging re-assuring. Based on patient history, patient to be admitted to the hospital for further TIA/stroke work-up. Patient to be admitted under the care of [facility] - Telemetry floor. Discussed plan for admission with patient who agrees to plan of care. Patient stable for transfer.   Raymon Mutton, PA-C 08/31/14 8119  Derwood Kaplan, MD 08/31/14 [phone]

## 2014-08-30 NOTE — Telephone Encounter (Signed)
Spoke with pt and reminded pt of remote transmission that is due today. Pt verbalized understanding.  Pt stated that she has not been feeling well she is going to call [facility] Kathalene FramesShannan [facility]r [facility] [place] to get checked from a recent fall. Pt has coumadin appt on 11-11 at the [place] office. I informed pt to call [place] office to see if she can get INR checked tomorrow. She stated that if she goes to Dr. Ella Jubileeatenr she will probably check that today. I still strongly [facility] that [facility]t.

## 2014-08-31 ENCOUNTER — Emergency Department (HOSPITAL_COMMUNITY): Payer: Medicaid Other

## 2014-08-31 DIAGNOSIS — G459 Transient cerebral ischemic attack, unspecified: Secondary | ICD-10-CM

## 2014-08-31 DIAGNOSIS — Z950 Presence of cardiac pacemaker: Secondary | ICD-10-CM

## 2014-08-31 DIAGNOSIS — F39 Unspecified mood [affective] disorder: Secondary | ICD-10-CM

## 2014-08-31 DIAGNOSIS — I519 Heart disease, unspecified: Secondary | ICD-10-CM | POA: Diagnosis not present

## 2014-08-31 DIAGNOSIS — R2681 Unsteadiness on feet: Secondary | ICD-10-CM

## 2014-08-31 DIAGNOSIS — R471 Dysarthria and anarthria: Secondary | ICD-10-CM

## 2014-08-31 DIAGNOSIS — E669 Obesity, unspecified: Secondary | ICD-10-CM

## 2014-08-31 DIAGNOSIS — G4736 Sleep related hypoventilation in conditions classified elsewhere: Secondary | ICD-10-CM

## 2014-08-31 DIAGNOSIS — W19XXXA Unspecified fall, initial encounter: Secondary | ICD-10-CM

## 2014-08-31 DIAGNOSIS — R42 Dizziness and giddiness: Secondary | ICD-10-CM

## 2014-08-31 DIAGNOSIS — I2699 Other pulmonary embolism without acute cor pulmonale: Secondary | ICD-10-CM | POA: Diagnosis present

## 2014-08-31 LAB — COMPREHENSIVE METABOLIC PANEL
ALBUMIN: 2.9 g/dL — AB (ref 3.5–5.2)
ALT: 14 U/L (ref 0–35)
ALT: 12 U/L (ref 0–35)
ANION GAP: 11 (ref 5–15)
AST: 19 U/L (ref 0–37)
AST: 18 U/L (ref 0–37)
Albumin: 3.1 g/dL — ABNORMAL LOW (ref 3.5–5.2)
Alkaline Phosphatase: 74 U/L (ref 39–117)
Alkaline Phosphatase: 62 U/L (ref 39–117)
Anion gap: 10 (ref 5–15)
BILIRUBIN TOTAL: 0.3 mg/dL (ref 0.3–1.2)
BILIRUBIN TOTAL: 0.5 mg/dL (ref 0.3–1.2)
BUN: 17 mg/dL (ref 6–23)
BUN: 13 mg/dL (ref 6–23)
CHLORIDE: 104 meq/L (ref 96–112)
CHLORIDE: 106 meq/L (ref 96–112)
CO2: 28 meq/L (ref 19–32)
CO2: 27 meq/L (ref 19–32)
Calcium: 8.9 mg/dL (ref 8.4–10.5)
Calcium: 8.4 mg/dL (ref 8.4–10.5)
Creatinine, Ser: 0.74 mg/dL (ref 0.50–1.10)
Creatinine, Ser: 0.67 mg/dL (ref 0.50–1.10)
GFR calc Af Amer: >90 mL/min (ref >90)
GFR calc non Af Amer: >90 mL/min (ref >90)
GLUCOSE: 82 mg/dL (ref 70–99)
Glucose, Bld: 99 mg/dL (ref 70–99)
Potassium: 4.3 mEq/L (ref 3.7–5.3)
Potassium: 3.8 mEq/L (ref 3.7–5.3)
SODIUM: 143 meq/L (ref 137–147)
Sodium: 143 mEq/L (ref 137–147)
Total Protein: 7.2 g/dL (ref 6.0–8.3)
Total Protein: 6.5 g/dL (ref 6.0–8.3)

## 2014-08-31 LAB — CBC WITH DIFFERENTIAL/PLATELET
Basophils Absolute: 0.1 10*3/uL (ref 0.0–0.1)
Basophils Relative: 1 % (ref 0–1)
Eosinophils Absolute: 0.1 10*3/uL (ref 0.0–0.7)
Eosinophils Relative: 1 % (ref 0–5)
HCT: 35.6 % — ABNORMAL LOW (ref 36.0–46.0)
Hemoglobin: 11.9 g/dL — ABNORMAL LOW (ref 12.0–15.0)
Lymphocytes Relative: 60 % — ABNORMAL HIGH (ref 12–46)
Lymphs Abs: 5.2 10*3/uL — ABNORMAL HIGH (ref 0.7–4.0)
MCH: 32.3 pg (ref 26.0–34.0)
MCHC: 33.4 g/dL (ref 30.0–36.0)
MCV: 96.7 fL (ref 78.0–100.0)
MONOS PCT: 16 % — AB (ref 3–12)
Monocytes Absolute: 1.4 10*3/uL — ABNORMAL HIGH (ref 0.1–1.0)
NEUTROS PCT: 22 % — AB (ref 43–77)
Neutro Abs: 1.9 10*3/uL (ref 1.7–7.7)
PLATELETS: 285 10*3/uL (ref 150–400)
RBC: 3.68 MIL/uL — AB (ref 3.87–5.11)
RDW: 13.8 % (ref 11.5–15.5)
WBC: 8.7 10*3/uL (ref 4.0–10.5)

## 2014-08-31 LAB — RAPID URINE DRUG SCREEN, HOSP PERFORMED
AMPHETAMINES: NOT DETECTED
BARBITURATES: NOT DETECTED
BENZODIAZEPINES: POSITIVE — AB
Cocaine: NOT DETECTED
Opiates: POSITIVE — AB
TETRAHYDROCANNABINOL: NOT DETECTED

## 2014-08-31 LAB — URINALYSIS, ROUTINE W REFLEX MICROSCOPIC
Bilirubin Urine: NEGATIVE
GLUCOSE, UA: 100 mg/dL — AB
Hgb urine dipstick: NEGATIVE
KETONES UR: 15 mg/dL — AB
NITRITE: NEGATIVE
PROTEIN: NEGATIVE mg/dL
Specific Gravity, Urine: 1.028 (ref 1.005–1.030)
UROBILINOGEN UA: 1 mg/dL (ref 0.0–1.0)
pH: 6 (ref 5.0–8.0)

## 2014-08-31 LAB — VITAMIN B12: VITAMIN B 12: 442 pg/mL (ref 211–911)

## 2014-08-31 LAB — CBG MONITORING, ED: GLUCOSE-CAPILLARY: 94 mg/dL (ref 70–99)

## 2014-08-31 LAB — HEMOGLOBIN A1C
Hgb A1c MFr Bld: 5.6 % (ref <5.7)
MEAN PLASMA GLUCOSE: 114 mg/dL (ref <117)

## 2014-08-31 LAB — FOLATE

## 2014-08-31 LAB — PROTIME-INR
INR: 2.05 — ABNORMAL HIGH (ref 0.00–1.49)
INR: 2.13 — ABNORMAL HIGH (ref 0.00–1.49)
PROTHROMBIN TIME: 23.3 s — AB (ref 11.6–15.2)
Prothrombin Time: 24 seconds — ABNORMAL HIGH (ref 11.6–15.2)

## 2014-08-31 LAB — URINE MICROSCOPIC-ADD ON

## 2014-08-31 LAB — GLUCOSE, CAPILLARY
GLUCOSE-CAPILLARY: 108 mg/dL — AB (ref 70–99)
Glucose-Capillary: 100 mg/dL — ABNORMAL HIGH (ref 70–99)

## 2014-08-31 LAB — LIPID PANEL
CHOL/HDL RATIO: 2 ratio
Cholesterol: 136 mg/dL (ref 0–200)
HDL: 67 mg/dL (ref >39)
LDL CALC: 51 mg/dL (ref 0–99)
Triglycerides: 91 mg/dL (ref <150)
VLDL: 18 mg/dL (ref 0–40)

## 2014-08-31 LAB — PRO B NATRIURETIC PEPTIDE: Pro B Natriuretic peptide (BNP): 713.4 pg/mL — ABNORMAL HIGH (ref 0–125)

## 2014-08-31 LAB — TSH: TSH: 3.17 u[IU]/mL (ref 0.350–4.500)

## 2014-08-31 LAB — TROPONIN I: Troponin I: <0.3 ng/mL (ref <0.30)

## 2014-08-31 MED ORDER — SODIUM CHLORIDE 0.9 % IV BOLUS (SEPSIS)
1000.0000 mL | Freq: Once | INTRAVENOUS | Status: AC
  Administered 2014-08-31: 1000 mL via INTRAVENOUS

## 2014-08-31 MED ORDER — ALBUTEROL SULFATE HFA 108 (90 BASE) MCG/ACT IN AERS: 2.0000 | INHALATION_SPRAY | Freq: Four times a day (QID) | RESPIRATORY_TRACT | Status: DC | PRN

## 2014-08-31 MED ORDER — WARFARIN - PHARMACIST DOSING INPATIENT: Freq: Every day | Status: DC

## 2014-08-31 MED ORDER — MORPHINE SULFATE ER 15 MG PO TBCR
15.0000 mg | EXTENDED_RELEASE_TABLET | Freq: Two times a day (BID) | ORAL | Status: DC
  Administered 2014-08-31 – 2014-09-01 (×3): 15 mg via ORAL
  Filled 2014-08-31 (×3): qty 1

## 2014-08-31 MED ORDER — OXYCODONE HCL 5 MG PO TABS
10.0000 mg | ORAL_TABLET | Freq: Four times a day (QID) | ORAL | Status: DC | PRN
  Administered 2014-08-31 – 2014-09-01 (×3): 10 mg via ORAL
  Filled 2014-08-31 (×3): qty 2

## 2014-08-31 MED ORDER — STROKE: EARLY STAGES OF RECOVERY BOOK
Freq: Once | Status: DC
  Filled 2014-08-31: qty 1

## 2014-08-31 MED ORDER — FUROSEMIDE 20 MG PO TABS
10.0000 mg | ORAL_TABLET | ORAL | Status: DC
  Administered 2014-08-31: 10 mg via ORAL
  Filled 2014-08-31 (×2): qty 1

## 2014-08-31 MED ORDER — TRAZODONE HCL 50 MG PO TABS
150.0000 mg | ORAL_TABLET | Freq: Every day | ORAL | Status: DC
  Administered 2014-08-31: 150 mg via ORAL
  Filled 2014-08-31 (×2): qty 1

## 2014-08-31 MED ORDER — DOCUSATE SODIUM 100 MG PO CAPS
100.0000 mg | ORAL_CAPSULE | Freq: Two times a day (BID) | ORAL | Status: DC
  Administered 2014-08-31 – 2014-09-01 (×3): 100 mg via ORAL
  Filled 2014-08-31 (×3): qty 1

## 2014-08-31 MED ORDER — FLUTICASONE PROPIONATE 50 MCG/ACT NA SUSP
2.0000 | Freq: Every day | NASAL | Status: DC
  Filled 2014-08-31: qty 16

## 2014-08-31 MED ORDER — WARFARIN SODIUM 7.5 MG PO TABS: 7.5000 mg | ORAL_TABLET | ORAL | Status: DC

## 2014-08-31 MED ORDER — PANTOPRAZOLE SODIUM 40 MG PO TBEC
40.0000 mg | DELAYED_RELEASE_TABLET | Freq: Every day | ORAL | Status: DC
  Administered 2014-08-31 – 2014-09-01 (×2): 40 mg via ORAL
  Filled 2014-08-31 (×2): qty 1

## 2014-08-31 MED ORDER — CLONAZEPAM 1 MG PO TABS
1.0000 mg | ORAL_TABLET | Freq: Two times a day (BID) | ORAL | Status: DC
  Administered 2014-08-31 – 2014-09-01 (×3): 1 mg via ORAL
  Filled 2014-08-31: qty 2
  Filled 2014-08-31 (×2): qty 1

## 2014-08-31 MED ORDER — WARFARIN SODIUM 5 MG PO TABS
5.0000 mg | ORAL_TABLET | ORAL | Status: DC
  Administered 2014-08-31: 5 mg via ORAL
  Filled 2014-08-31: qty 1

## 2014-08-31 MED ORDER — POLYETHYLENE GLYCOL 3350 17 G PO PACK
17.0000 g | PACK | Freq: Every day | ORAL | Status: DC
  Filled 2014-08-31 (×2): qty 1

## 2014-08-31 MED ORDER — MORPHINE SULFATE 4 MG/ML IJ SOLN
4.0000 mg | Freq: Once | INTRAMUSCULAR | Status: AC
  Administered 2014-08-31: 4 mg via INTRAVENOUS
  Filled 2014-08-31: qty 1

## 2014-08-31 MED ORDER — ALBUTEROL SULFATE (2.5 MG/3ML) 0.083% IN NEBU: 2.5000 mg | INHALATION_SOLUTION | Freq: Four times a day (QID) | RESPIRATORY_TRACT | Status: DC | PRN

## 2014-08-31 MED ORDER — METOPROLOL TARTRATE 50 MG PO TABS
50.0000 mg | ORAL_TABLET | Freq: Two times a day (BID) | ORAL | Status: DC
  Administered 2014-08-31 (×2): 50 mg via ORAL
  Filled 2014-08-31: qty 1
  Filled 2014-08-31: qty 2

## 2014-08-31 NOTE — ED Notes (Signed)
Patient transported to CT SCAN . 

## 2014-08-31 NOTE — ED Notes (Signed)
Spoke with Company secretarylow Manager about bed status states waiting for discharges on floor.

## 2014-08-31 NOTE — Progress Notes (Signed)
UR completed 

## 2014-08-31 NOTE — ED Notes (Signed)
Attemped report to floor. Notified that bed was not ready.

## 2014-08-31 NOTE — Progress Notes (Signed)
Subjective: Patient seen and examined, admitted for TIA this morning. She has intermittent weakness on right side. No MRI could be obtained as she has  Filed Vitals:   08/31/14 1739  BP: 127/73  Pulse: 91  Temp: 98 F (36.7 C)  Resp: 20    Chest: Clear Bilaterally Heart : S1S2 RRR Abdomen: Soft, nontender Ext : No edema Neuro: Alert, oriented x 3  A/P  TIA Will get Neuro consult  Continue Warfarin   Meredeth IdeGagan S Rai Severns [facility] [phone]- [phone]

## 2014-08-31 NOTE — Progress Notes (Signed)
*  PRELIMINARY RESULTS* Echocardiogram 2D Echocardiogram has been performed.  Susan Howell, Susan Howell 08/31/2014, 9:26 AM

## 2014-08-31 NOTE — H&P (Signed)
[facility] History and Physical  Patient: Susan Howell  ZOX:[id number]  DOB: [birth year]  DOS: the patient was seen and examined on 08/31/2014 PCP: Pcp Not In System  Chief Complaint: speech difficulty as well as memory issues  HPI: Susan Howell is a 58 y.o. female with Past medical history of anxiety, depression, fibromyalgia, lung nodule, pulmonary embolism, diastolic dysfunction, hypertension, pacemaker implant, esophageal motility disorder, chronically on narcotics. The patient presents with complaints of speech difficulty. She mentions that 3 days ago while she was walking she lost her balance and fell backwards. She felt as if she was on the loss consciousness but did not. She has chronic dizziness but since that fall she has been having progressively worsening dizziness along with vertigo whenever she is trying to change her position. She mentions that her fianc has found that she has been having memory issues with forgetting what she has been doing after initiating something. She also was found by the fianc to have slurred speech on and off. She complains of some headache on and off. Denies any diarrhea, burning urination, nausea, vomiting, After the fall she feels as if she has a broken rib on the right side and has been having pain there since.  The patient is coming from home. And at her baseline independent for most of her ADL.  Review of Systems: as mentioned in the history of present illness.  A Comprehensive review of the other systems is negative.  Past Medical History  Diagnosis Date  . Anxiety   . Depression   . Obesity   . HTN (hypertension)   . Obesity   . Cystitis   . Palpitations   . Lung nodules   . History of urinary tract infection   . Pulmonary embolism     a. 2/2 gastric bypass, 1979. b. 12/12/2012  . H/O diastolic dysfunction     a. Echo 12/13/12: EF 65-70%, G2DD, pulm press . b. Echo 02/03/13: 50-55%, no WMA, lef ventricular diastolic  function nl, pulm arteries w/in nl range, mild LA dilation.    . Sleep apnea     a. no cpap now uses 02 2L at night  . Pacemaker 2009    a. Dr. Ladona Ridgel. b. [employer]tr[employer], SN# [id number], est longevity 10.5 years  . Sinoatrial node dysfunction 2009  . Headache(784.0)   . Motility disorder, esophageal   . Arthritis   . Fibromyalgia   . Thyroid nodule   . Acute stress reaction     Husband passed 2/2 car falling on him that he was working on 2014   . Orthostatic hypotension    Past Surgical History  Procedure Laterality Date  . Pacemaker placement      x2, pacemaker leads replaced  . Appendectomy    . Cholecystectomy    . 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 Gastric bypass    . Stomach surgery      "2/3 of stomach removed"  . Abdominal hysterectomy    . Ectopic pregnancy surgery    . Foot surgery      both feet, and ankles  . Knee surgery Bilateral   . Rib resection    . Abdominal surgery      muscle deteriorated in abdomen  . Eus N/A 03/03/2013    Procedure: UPPER ENDOSCOPIC ULTRASOUND (EUS) LINEAR;  Surgeon: Rachael Fee, MD;  Location: [facility];  Service: Endoscopy;  Laterality: N/A;  . Laminectomy  Social History:  reports that she has never smoked. She has never used smokeless tobacco. She reports that she does not drink alcohol or use illicit drugs.  Allergies  Allergen Reactions  . Iodine Other (See Comments)    seizures  . Labetalol Hcl Hypertension  . Bactrim [Sulfamethoxazole-Trimethoprim] Other (See Comments)    R/T Coumadin  . Eszopiclone Other (See Comments)    REACTION: NIGHT MARES  . Iohexol Nausea And Vomiting and Other (See Comments)     Desc: NAUSEA,VOMITING & SZ S/P CONTRAST INJ 30 YRS AGO// OK W/ PRE MEDS, BENADRYL & SOLUMEDROL TODAY//A.C., Onset Date: [id number]   . Penicillins Nausea And Vomiting    Cannot take tablets by mouth; IV is fine.   . Smallpox Vaccine Other (See Comments)     Swelling at injection site, fever  . Tetanus Toxoids Swelling and Other (See Comments)    Fever, flushing and hardening of the injection site  . Topamax Other (See Comments)    Confusion, high blood pressure.   . Vancomycin Nausea Only    Severe chills  . Aliskiren Fumarate Other (See Comments)    Caused SYNCOPE  . Codeine Itching    Family History  Problem Relation Age of Onset  . Colon cancer Neg Hx   . Throat cancer Neg Hx   . Esophageal cancer Neg Hx   . Stomach cancer Neg Hx   . Liver disease Father     PBC  . Kidney disease Neg Hx   . Diabetes Brother     twin  . Heart attack Brother     Prior to Admission medications   Medication Sig Start Date End Date Taking? Authorizing Provider  carisoprodol (SOMA) 350 MG tablet Take 350 mg by mouth at bedtime as needed for muscle spasms.    Yes Historical Provider, MD  clonazePAM (KLONOPIN) 1 MG tablet Take 1 mg by mouth 2 (two) times daily.   Yes Historical Provider, MD  docusate sodium (COLACE) 50 MG capsule Take 100 mg by mouth 2 (two) times daily.    Yes Historical Provider, MD  fluticasone (FLONASE) 50 MCG/ACT nasal spray Place 2 sprays into both nostrils daily.   Yes Historical Provider, MD  furosemide (LASIX) 40 MG tablet Take 10 mg by mouth 2 (two) times a week.    Yes Historical Provider, MD  lisinopril (PRINIVIL,ZESTRIL) 5 MG tablet Take 2.5 mg by mouth as needed (for extremely elevated blood pressures).    Yes Historical Provider, MD  metoprolol (LOPRESSOR) 50 MG tablet Take 50 mg by mouth 2 (two) times daily.    Yes Historical Provider, MD  morphine (MS CONTIN) 15 MG 12 hr tablet Take 15 mg by mouth 2 (two) times daily.    Yes Historical Provider, MD  nystatin (MYCOSTATIN) 100000 UNIT/ML suspension Take 5 mLs by mouth 4 (four) times daily as needed (takes when taking antibiotics).    Yes Historical Provider, MD  omeprazole (PRILOSEC) 20 MG capsule Take 20 mg by mouth every Monday, Wednesday, and Friday.    Yes Historical  Provider, MD  Oxycodone HCl 10 MG TABS Take 10 mg by mouth every 6 (six) hours as needed (for pain).   Yes Historical Provider, MD  polyethylene glycol (MIRALAX / GLYCOLAX) packet Take 17 g by mouth daily.   Yes Historical Provider, MD  promethazine (PHENERGAN) 25 MG tablet Take 25 mg by mouth every 8 (eight) hours as needed for nausea or vomiting.   Yes Historical Provider, MD  traZODone (DESYREL)  150 MG tablet Take 150 mg by mouth at bedtime.   Yes Historical Provider, MD  warfarin (COUMADIN) 5 MG tablet Take 5-7.5 mg by mouth daily. Takes 5mg  (1tablet) daily except Wednesday then takes 7.5mg  (1 and half tablets)   Yes Historical Provider, MD  albuterol (PROAIR HFA) 108 (90 BASE) MCG/ACT inhaler Inhale 2 puffs into the lungs every 6 (six) hours as needed for wheezing.    Historical Provider, MD  cyanocobalamin 1000 MCG tablet Take 1,000 mcg by mouth daily.     Historical Provider, MD  warfarin (COUMADIN) 5 MG tablet TAKE AS DIRECTED BY ANTICOAGULATION CLINIC 08/09/14   Duke Salvia, MD    Physical Exam: Filed Vitals:   08/31/14 0327 08/31/14 0330 08/31/14 0430 08/31/14 0530  BP:  130/77 144/64 125/67  Pulse:  64 65 59  Temp: 97.4 F (36.3 C)     TempSrc:      Resp:  20 22 15   Height:      Weight:      SpO2:  98% 96% 95%    General: Alert, Awake and Oriented to Time, Place and Person. Appear in mild distress Eyes: PERRL ENT: Oral Mucosa clear moist. Neck: no JVD Cardiovascular: S1 and S2 Present, no Murmur, Peripheral Pulses Present Respiratory: Bilateral Air entry equal and Decreased, Clear to Auscultation, noCrackles, no wheezes Abdomen: Bowel Sound present, Soft and non tender Skin: no Rash Extremities: no Pedal edema, no calf tenderness Neurologic: Grossly no focal neuro deficit.  Labs on Admission:  CBC:  Recent Labs Lab 08/30/14 2329 08/30/14 2343  WBC 7.6  --   NEUTROABS 1.5*  --   HGB 12.5 13.9  HCT 38.2 41.0  MCV 99.2  --   PLT 283  --     CMP      Component Value Date/Time   NA 143 08/30/2014 2343   NA 142 06/21/2014 1513   K 4.1 08/30/2014 2343   CL 102 08/30/2014 2343   CO2 28 08/30/2014 2329   GLUCOSE 82 08/30/2014 2343   GLUCOSE 100* 06/21/2014 1513   BUN 18 08/30/2014 2343   BUN 16 06/21/2014 1513   CREATININE 0.80 08/30/2014 2343   CALCIUM 8.9 08/30/2014 2329   PROT 7.2 08/30/2014 2329   PROT 6.4 06/21/2014 1513   ALBUMIN 3.1* 08/30/2014 2329   AST 19 08/30/2014 2329   ALT 14 08/30/2014 2329   ALKPHOS 74 08/30/2014 2329   BILITOT 0.3 08/30/2014 2329   GFRNONAA >90 08/30/2014 2329   GFRAA >90 08/30/2014 2329    No results for input(s): LIPASE, AMYLASE in the last 168 hours. No results for input(s): AMMONIA in the last 168 hours.   Recent Labs Lab 08/30/14 2329  TROPONINI <0.30   BNP (last 3 results)  Recent Labs  06/18/14 2056  PROBNP 362.1*    Radiological Exams on Admission: Dg Ribs Unilateral W/chest Right  08/31/2014   CLINICAL DATA:  Fall, back pain and RIGHT rib pain. Syncope and fall on Sunday, possible loss of consciousness. On blood thinner. History of stroke.  EXAM: RIGHT RIBS AND CHEST - 3+ VIEW  COMPARISON:  Chest radiograph December 30, 2013  FINDINGS: Cardiac silhouette is upper limits of normal in size. Mediastinal silhouette is unremarkable. Dual lead LEFT cardiac pacemaker in situ. Mildly elevated LEFT hemidiaphragm. RIGHT hepatic eventration. Mild central pulmonary vascular congestion without pleural effusions or focal consolidations. No pneumothorax.  No rib fracture deformity. LEFT thoracotomy. Thoracolumbar posterior instrumentation. Surgical clips in the included right abdomen likely  reflect cholecystectomy.  IMPRESSION: Borderline cardiomegaly and central pulmonary vascular congestion.  No rib fracture deformity.   Electronically Signed   By: Awilda Metro   On: 08/31/2014 01:21   Dg Thoracic Spine W/swimmers  08/31/2014   CLINICAL DATA:  Fall, back pain. Syncope and fall on Sunday,  possible loss of consciousness. On blood thinner. History of stroke.  EXAM: THORACIC SPINE - 2 VIEW + SWIMMERS  COMPARISON:  CT of the chest December 30, 2013  FINDINGS: Nondiagnostic swimmer's view. There is no evidence of thoracic spine fracture though, patient is osteopenic which decreases sensitivity. Alignment is normal. Thoracolumbar posterior instrumentation transfixing proximate L2 burst fracture. No other significant bone abnormalities are identified. Cardiac pacemaker in situ, which may be a contraindication to MRI.  IMPRESSION: No acute fracture deformity or malalignment. Patient is osteopenic which decreases sensitivity for acute nondisplaced fractures.   Electronically Signed   By: Awilda Metro   On: 08/31/2014 01:23   Dg Lumbar Spine Complete  08/31/2014   CLINICAL DATA:  Low back pain and upper back pain after a fall. Pain anterior right ribs.  EXAM: LUMBAR SPINE - COMPLETE 4+ VIEW  COMPARISON:  03/28/2014  FINDINGS: There is an old compression fracture of the L1 vertebra with posterior rod and screw fixation from T11 through L3. No significant change in hardware position. Lumbar kyphosis centered at L1 without significant change. Degenerative changes throughout the lumbar spine with narrowed lumbar interspaces and associated endplate hypertrophic changes. Diffuse bone demineralization. No acute fracture deformities are demonstrated. Surgical clips in the upper abdomen.  IMPRESSION: Old fracture of L1 with posterior rod and screw fixation from T11 through L3. Diffuse bone demineralization and degenerative changes. No acute displaced fractures appreciated.   Electronically Signed   By: Burman Nieves M.D.   On: 08/31/2014 01:13   Dg Hip Complete Left  08/31/2014   CLINICAL DATA:  Fall, back pain. Syncope and fall on Sunday, possible loss of consciousness. On blood thinner. History of stroke.  EXAM: LEFT HIP - COMPLETE 2+ VIEW  COMPARISON:  None.  FINDINGS: There is no evidence of hip fracture  or dislocation. Phleboliths in the pelvis. There is no evidence of arthropathy or other focal bone abnormality. Partially imaged lumbar instrumentation.  IMPRESSION: Negative.   Electronically Signed   By: Awilda Metro   On: 08/31/2014 01:19   Ct Head Wo Contrast  08/31/2014   CLINICAL DATA:  Recent fall and complains of back and head pain.  EXAM: CT HEAD WITHOUT CONTRAST  CT CERVICAL SPINE WITHOUT CONTRAST  TECHNIQUE: Multidetector CT imaging of the head and cervical spine was performed following the standard protocol without intravenous contrast. Multiplanar CT image reconstructions of the cervical spine were also generated.  COMPARISON:  11/20/2009  FINDINGS: CT HEAD FINDINGS  No evidence for acute hemorrhage, mass lesion, midline shift, hydrocephalus or large infarct. Paranasal sinuses are clear. No acute bone abnormality. The entire vertex was not imaged.  CT CERVICAL SPINE FINDINGS  Negative for a fracture or dislocation. No evidence for soft tissue swelling in the neck. Lung apices are clear. The facets are located. Alignment of the cervical spine is normal. The vertebral body heights are maintained.  IMPRESSION: No acute intracranial abnormality.  No acute bone abnormality in the cervical spine.   Electronically Signed   By: Richarda Overlie M.D.   On: 08/31/2014 01:11   Ct Cervical Spine Wo Contrast  08/31/2014   CLINICAL DATA:  Recent fall and complains of back and  head pain.  EXAM: CT HEAD WITHOUT CONTRAST  CT CERVICAL SPINE WITHOUT CONTRAST  TECHNIQUE: Multidetector CT imaging of the head and cervical spine was performed following the standard protocol without intravenous contrast. Multiplanar CT image reconstructions of the cervical spine were also generated.  COMPARISON:  11/20/2009  FINDINGS: CT HEAD FINDINGS  No evidence for acute hemorrhage, mass lesion, midline shift, hydrocephalus or large infarct. Paranasal sinuses are clear. No acute bone abnormality. The entire vertex was not imaged.  CT  CERVICAL SPINE FINDINGS  Negative for a fracture or dislocation. No evidence for soft tissue swelling in the neck. Lung apices are clear. The facets are located. Alignment of the cervical spine is normal. The vertebral body heights are maintained.  IMPRESSION: No acute intracranial abnormality.  No acute bone abnormality in the cervical spine.   Electronically Signed   By: Richarda OverlieAdam  Henn M.D.   On: 08/31/2014 01:11    EKG: Independently reviewed. normal sinus rhythm, nonspecific ST and T waves changes.  Assessment/Plan Principal Problem:   TIA (transient ischemic attack) Active Problems:   Bipolar disorder   Pacemaker-[employer]tr[employer]   Nocturnal hypoxemia due to obesity   Dysarthria   Pulmonary embolism   1. TIA (transient ischemic attack) The patient is presenting with complaints of speech difficulty.she also had recurrent falls and dizziness as well as vertigo. On examination she does not appear to have any significant focal deficit. ED evaluation including lab work, CT head CT C-spine are negative for any acute abnormality. With this the patient will be admitted in the hospital to rule out TIA. Due to her pacemaker we cannot obtain MRI. Neurology will be formally consulted in the morning. PTOT consult and serial neuro checks will be done.echocardiogram as well as carotid Doppler  2.Recurrent fall Rib pain Extensive evaluation in the ER does not show any acute abnormality. Continue to closely monitor as well as working PTOT.  3.history of PE Continue warfarin.  4.movement disorder Chronic pain disorder Continue home medication.  5. Diastolic dysfunction. At present stable. Continue Lasix at home doses. Continue metoprolol.  Advance goals of care discussion: full code   Consults: none  DVT Prophylaxis: on chronic anticoagulation Nutrition: nothing by mouth until stroke evaluation, cardiac diet after that  Disposition: Admitted to observation in telemetry unit.  Author: Lynden OxfordPranav  Adyn Hoes, MD [facility] Pager: [phone] 08/31/2014, 6:39 AM    If 7PM-7AM, please contact night-coverage [email or web address] Password [email or web address]

## 2014-08-31 NOTE — Consult Note (Signed)
Neurology Consultation Reason for Consult: unsteadiness Referring Physician: Drusilla Howell, G  CC: unsteadiness  History is obtained from:patient  HPI: Susan Howell is a 58 y.o. female with a history of multiple medical problems who presents with confusion, blurred vision, mild unsteadiness since a fall several days ago.  She states that at baseline, if she is laying in bed and looks over quickly she gets a sense of room spinning. She also notes that if she looks up quickly, she gets unsteady for a few moments before it passes. She states that she did this and became unsteady on Sunday and because of this fell backwards hitting her head.  She states that she saw "stars" and felt confused. Since then, she has had mild confusion, short-term memory loss, mild blurred vision, unsteadiness.   She also notes a longstanding issue of getting unsteady when walking from a lighted room to a dark room.    ROS: A 14 point ROS was performed and is negative except as noted in the HPI.   Past Medical History  Diagnosis Date  . Anxiety   . Depression   . Obesity   . HTN (hypertension)   . Obesity   . Cystitis   . Palpitations   . Lung nodules   . History of urinary tract infection   . Pulmonary embolism     a. 2/2 gastric bypass, 1979. b. 12/12/2012  . H/O diastolic dysfunction     a. Echo 12/13/12: EF 65-70%, G2DD, pulm press 38mmHg. b. Echo 02/03/13: 50-55%, no WMA, lef ventricular diastolic function nl, pulm arteries w/in nl range, mild LA dilation.    . Sleep apnea     a. no cpap now uses 02 2L at night  . Pacemaker 2009    a. Dr. Ladona Howell. b. [employer]tr[employer], SN# [id number] H, est longevity 10.5 years  . Sinoatrial node dysfunction 2009  . Headache(784.0)   . Motility disorder, esophageal   . Arthritis   . Fibromyalgia   . Thyroid nodule   . Acute stress reaction     Husband passed 2/2 car falling on him that he was working on 2014   . Orthostatic hypotension     Family History: Brother -  DM  Social History: Tob: denies  Exam: Current vital signs: BP 127/73 mmHg  Pulse 91  Temp(Src) 98 F (36.7 C) (Oral)  Resp 20  Ht 5' (1.524 m)  Wt 81.8 kg (180 lb 5.4 oz)  BMI 35.22 kg/m2  SpO2 98% Vital signs in last 24 hours: Temp:  [97.4 F (36.3 C)-98.3 F (36.8 C)] 98 F (36.7 C) (11/05 1739) Pulse Rate:  [56-91] 91 (11/05 1739) Resp:  [13-22] 20 (11/05 1739) BP: (109-159)/(64-97) 127/73 mmHg (11/05 1739) SpO2:  [95 %-100 %] 98 % (11/05 1739) Weight:  [81.8 kg (180 lb 5.4 oz)-85.73 kg (189 lb)] 81.8 kg (180 lb 5.4 oz) (11/05 1549)  General: in bed, NAD CV: RRR Mental Status: Patient is awake, alert, oriented to person, place, month, year, and situation. Immediate and remote memory are intact. Patient is able to give a clear and coherent history. No signs of aphasia or neglect Cranial Nerves: II: Visual Fields are full. Pupils are equal, round, and reactive to light.  Discs are difficult to visualize. III,IV, VI: EOMI without ptosis or diploplia.  V: Facial sensation is symmetric to temperature VII: Facial movement is symmetric.  VIII: hearing is intact to voice X: Uvula elevates symmetrically XI: Shoulder shrug is symmetric. XII: tongue is midline without atrophy  or fasciculations.  Motor: Tone is normal. Bulk is normal. 5/5 strength was present in all four extremities with the exception of the distal left hand(old per patient).  Sensory: Sensation is diminished to vibration markedly distally.  Deep Tendon Reflexes: 2+ and symmetric in the biceps, 3+ in the patellae and 1+ in the ankles.  Cerebellar: FNF  intact bilaterally Gait: Not tested 2/2 pt safety concerns.         I have reviewed labs in epic and the results pertinent to this consultation are: cmp - unremarkable.  B12 442 A1C 5.6  I have reviewed the images obtained: CT head - negative.   Impression: 58 yo F with symptoms consistent with benign paroxysmal vertigo and concussion. I  suspect that the fall was caused by her BPPV and the symptoms she is complaining of since that time are due to post-concussive syndrome.   Treatment would be supportive at this time, and physical therapy may be helpful.   Her sensory loss is concerning for neuropathy. She has seen Dr. Marjory Howell for this in the past.   Recommendations: 1) Physical therapy.  2) RPR, serum copper 3) f/u as outpatient for EMG.  4) No further recommendations at this time, please call with any further questions or concerns.    Susan Howell Susan Kozub, MD [facility] [phone]  If 7pm- 7am, please page neurology on call as listed in AMION.

## 2014-08-31 NOTE — Progress Notes (Signed)
ANTICOAGULATION CONSULT NOTE - Initial Consult  Pharmacy Consult for Coumadin Indication: pulmonary embolus  Allergies  Allergen Reactions  . Iodine Other (See Comments)    seizures  . Labetalol Hcl Hypertension  . Bactrim [Sulfamethoxazole-Trimethoprim] Other (See Comments)    R/T Coumadin  . Eszopiclone Other (See Comments)    REACTION: NIGHT MARES  . Iohexol Nausea And Vomiting and Other (See Comments)     Desc: NAUSEA,VOMITING & SZ S/P CONTRAST INJ 30 YRS AGO// OK W/ PRE MEDS, BENADRYL & SOLUMEDROL TODAY//A.C., Onset Date: [id number]   . Penicillins Nausea And Vomiting    Cannot take tablets by mouth; IV is fine.   . Smallpox Vaccine Other (See Comments)    Swelling at injection site, fever  . Tetanus Toxoids Swelling and Other (See Comments)    Fever, flushing and hardening of the injection site  . Topamax Other (See Comments)    Confusion, high blood pressure.   . Vancomycin Nausea Only    Severe chills  . Aliskiren Fumarate Other (See Comments)    Caused SYNCOPE  . Codeine Itching    Patient Measurements: Height: 5' (152.4 cm) Weight: 189 lb (85.73 kg) IBW/kg (Calculated) : 45.5  Vital Signs: Temp: 97.4 F (36.3 C) (11/05 0327) Temp Source: Oral (11/04 2317) BP: 125/67 mmHg (11/05 0530) Pulse Rate: 59 (11/05 0530)  Labs:  Recent Labs  08/30/14 2329 08/30/14 2343 08/30/14 2355  HGB 12.5 13.9  --   HCT 38.2 41.0  --   PLT 283  --   --   LABPROT  --   --  23.3*  INR  --   --  2.05*  CREATININE 0.74 0.80  --   TROPONINI <0.30  --   --     Estimated Creatinine Clearance: 73.6 mL/min (by C-G formula based on Cr of 0.8).   Medical History: Past Medical History  Diagnosis Date  . Anxiety   . Depression   . Obesity   . HTN (hypertension)   . Obesity   . Cystitis   . Palpitations   . Lung nodules   . History of urinary tract infection   . Pulmonary embolism     a. 2/2 gastric bypass, 1979. b. 12/12/2012  . H/O diastolic dysfunction     a. Echo  12/13/12: EF 65-70%, G2DD, pulm press 38mmHg. b. Echo 02/03/13: 50-55%, no WMA, lef ventricular diastolic function nl, pulm arteries w/in nl range, mild LA dilation.    . Sleep apnea     a. no cpap now uses 02 2L at night  . Pacemaker 2009    a. Dr. Ladona Ridgelaylor. 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 Sinoatrial node dysfunction 2009  . Headache(784.0)   . Motility disorder, esophageal   . Arthritis   . Fibromyalgia   . Thyroid nodule   . Acute stress reaction     Husband passed 2/2 car falling on him that he was working on 2014   . Orthostatic hypotension     Medications:  Albuterol  Soma  Klonopin  Vit B12  Colace  Flonase  Lasix  Zestril  Lopressor  MSContin  Nystatin  Prilosec  OxyIR  Miralax  Phenergan  Trazodone   Coumadin 5 mg daily except 7.5 mg Wed  Assessment: 58 yo female admitted with possible CVA, h/o PE, to continue Coumadin  Goal of Therapy:  INR 2-3 Monitor platelets by anticoagulation protocol: Yes   Plan:  Continue home regimen Daily INR  Braya Habermehl, Earl LitesGregory  [place] 08/31/2014,6:00 AM

## 2014-08-31 NOTE — Progress Notes (Signed)
VASCULAR LAB PRELIMINARY  PRELIMINARY  PRELIMINARY  PRELIMINARY  Carotid duplex completed.    Preliminary report: Bilateral:  1-39% ICA stenosis.  Vertebral artery flow is antegrade.     Saphyre Cillo, RVS 08/31/2014, 3:12 PM

## 2014-08-31 NOTE — Plan of Care (Signed)
Problem: Acute Treatment Outcomes Goal: Neuro exam at baseline or improved Outcome: Progressing Goal: BP within ordered parameters Outcome: Progressing Goal: Airway maintained/protected Outcome: Completed/Met Date Met:  08/31/14

## 2014-09-01 ENCOUNTER — Encounter: Payer: Self-pay | Admitting: Cardiology

## 2014-09-01 DIAGNOSIS — H8110 Benign paroxysmal vertigo, unspecified ear: Secondary | ICD-10-CM

## 2014-09-01 DIAGNOSIS — I509 Heart failure, unspecified: Secondary | ICD-10-CM

## 2014-09-01 DIAGNOSIS — I5032 Chronic diastolic (congestive) heart failure: Secondary | ICD-10-CM

## 2014-09-01 DIAGNOSIS — H8111 Benign paroxysmal vertigo, right ear: Secondary | ICD-10-CM

## 2014-09-01 LAB — PROTIME-INR
INR: 2.23 — AB (ref 0.00–1.49)
Prothrombin Time: 24.9 seconds — ABNORMAL HIGH (ref 11.6–15.2)

## 2014-09-01 LAB — GLUCOSE, CAPILLARY: Glucose-Capillary: 81 mg/dL (ref 70–99)

## 2014-09-01 LAB — RPR

## 2014-09-01 MED ORDER — MECLIZINE HCL 25 MG PO TABS: 25.0000 mg | ORAL_TABLET | Freq: Three times a day (TID) | ORAL | Status: DC | PRN

## 2014-09-01 MED ORDER — MECLIZINE HCL 12.5 MG PO TABS: 25.0000 mg | ORAL_TABLET | Freq: Three times a day (TID) | ORAL | Status: DC | PRN

## 2014-09-01 NOTE — Progress Notes (Signed)
Occupational Therapy Evaluation and Discharge Patient Details Name: Susan Howell MRN: [id number] DOB: 01/31/1956 Today's Date: 09/01/2014    History of Present Illness Adm 08/31/14 with recent fall backwards, hit her head, vertigo, difficulty concentrating, and ? slurred speech. Head CT negative. 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 Pt currently at mod I level with ADLs and educated on ways to improve safety with ADLs/IADLs including check lists, setting timers, and reminders. No further acute OT needs.     Follow Up Recommendations  No OT follow up;Supervision - Intermittent    Equipment Recommendations  None recommended by OT    Recommendations for Other Services       Precautions / Restrictions Precautions Precautions: Fall Precaution Comments: pt reports h/o dizziness and falling backwards when looking up      Mobility Bed Mobility       General bed mobility comments: Pt sitting up in recliner when OT arrived.   Transfers Overall transfer level: Modified independent                 ADL Overall ADL's : Modified independent                                       General ADL Comments: Pt overall at mod I level with ADLs and discussed ways to make ADLs safer, including sitting down for bathing and dressing and having a checklist for pt to remember tasks to complete.      Vision                 Additional Comments: "It's better since she (PT) fixed my dizziness"   Perception Perception Perception Tested?: No   Praxis Praxis Praxis tested?: Within functional limits    Pertinent Vitals/Pain Pain Assessment: No/denies pain Faces Pain Scale:  Hurts even more Pain Location: back (with BPPV testing) Pain Intervention(s): Limited activity within patient's tolerance;Monitored during session;Repositioned     Hand Dominance Right   Extremity/Trunk Assessment Upper Extremity Assessment Upper Extremity Assessment: Generalized weakness   Lower Extremity Assessment Lower Extremity Assessment: Defer to PT evaluation   Cervical / Trunk Assessment Cervical / Trunk Assessment: Kyphotic   Communication Communication Communication: No difficulties   Cognition Arousal/Alertness: Awake/alert Behavior During Therapy: WFL for tasks assessed/performed Overall Cognitive Status: Within Functional Limits for tasks assessed Area of Impairment: Memory     Memory: Decreased short-term memory         General Comments: Tested cognition with MoCA and pt presented with difficulty on tasks relating to memory and attention. Discussed ways to be safer at home given pt's admitted lapses in memory such as using timers when cooking, setting reminders on smart phone, and completing one task at a time.    General Comments    Pt with +BPPV with PT and successful treatment completed.             Home Living Family/patient expects to be discharged to:: Private residence Living Arrangements: Alone Available Help at Discharge: Friend(s);Available PRN/intermittently Type of Home: House             Bathroom Shower/Tub: Tub/shower unit  Home Equipment: Cane - single point          Prior Functioning/Environment Level of Independence: Independent        Comments: stopped using cane 5 weeks ago    OT Diagnosis: Cognitive deficits;Disturbance of vision                          End of Session Nurse Communication: Other (comment) (pt is eager for d/c)  Activity Tolerance: Patient tolerated treatment well Patient left: in chair;with call bell/phone within reach   Time: 1238-1308 OT Time Calculation (min): 30  min Charges:  OT General Charges $OT Visit: 1 Procedure OT Evaluation $Initial OT Evaluation Tier I: 1 Procedure OT Treatments $Self Care/Home Management : 8-22 mins G-Codes: OT G-codes **NOT FOR INPATIENT CLASS** Functional Assessment Tool Used: clinical judgement Functional Limitation: Self care Self Care Current Status (Z6109(G8987): At least 1 percent but less than 20 percent impaired, limited or restricted Self Care Goal Status (U0454(G8988): At least 1 percent but less than 20 percent impaired, limited or restricted Self Care Discharge Status [phone]([id number]): At least 1 percent but less than 20 percent impaired, limited or restricted  Rae LipsMiller, Susan Howell 09/01/2014, 1:38 PM   Carney LivingLeeAnn Marie [name], OTR/L Occupational Therapist [phone] (pager)

## 2014-09-01 NOTE — Progress Notes (Signed)
Talked to patient about outpatient PT, patient requested [facility]/ Vestibular training; orders/ clinical information faxed as requested; Alexis GoodellB Jerone Cudmore RN,BSN,MHA [phone]

## 2014-09-01 NOTE — Evaluation (Addendum)
Physical Therapy Evaluation Patient Details Name: Susan Howell MRN: [id number] DOB: 01/31/1956 Today's Date: 09/01/2014   History of Present Illness  Adm 08/31/14 with recent fall backwards, hit her head, vertigo, difficulty concentrating, and ? slurred speech. Head CT negative. 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 Pt with + Rt posterior canal BPPV and treated successfully for this. Of note, pt has had vertigo only since her recent fall 3 days PTA, however she has been having dizziness and falls when she looks upward for several months. Etiology of these symptoms remains unclear and may require further medical testing.  All BPPV education has been completed and the patient has no further questions. See below for any follow-up Physial Therapy or equipment needs. PT is signing off. Thank you for this referral.     Follow Up Recommendations Outpatient PT (Vestibular rehab)    Equipment Recommendations  None recommended by PT    Recommendations for Other Services       Precautions / Restrictions Precautions Precautions: Fall Precaution Comments: pt reports h/o dizziness and falling backwards when looking up      Mobility  Bed Mobility Overal bed mobility: Needs Assistance Bed Mobility: Rolling;Sidelying to Sit Rolling: Modified independent (Device/Increase time) Sidelying to sit: Min assist       General bed mobility comments: assist while performing cannalith repositioning  Transfers Overall transfer level: Needs assistance Equipment used: None Transfers: Sit to/from Stand Sit to Stand: Supervision         General transfer comment: supervision due to recent fall and recent dizziness; denied dizziness and no imbalance  noted  Ambulation/Gait Ambulation/Gait assistance: Min guard;Supervision Ambulation Distance (Feet): 100 Feet Assistive device: 1 person hand held assist;None Gait Pattern/deviations: Step-through pattern (slow, guarded)     General Gait Details: able to turn her head (although slowly) side to side, perform pivot turns with no dizziness or vertigo or unsteadiness  Stairs            Wheelchair Mobility    Modified Rankin (Stroke Patients Only) Modified Rankin (Stroke Patients Only) Pre-Morbid Rankin Score: No significant disability Modified Rankin: Slight disability     Balance Overall balance assessment: Needs assistance;History of Falls Sitting-balance support: No upper extremity supported;Feet unsupported Sitting balance-Leahy Scale: Good     Standing balance support: No upper extremity supported Standing balance-Leahy Scale: Good                               Pertinent Vitals/Pain Pain Assessment: Faces Faces Pain Scale: Hurts even more Pain Location: back (with BPPV testing) Pain Intervention(s): Limited activity within patient's tolerance;Monitored during session;Repositioned    Home Living Family/patient expects to be discharged to:: Private residence Living Arrangements: Alone Available Help at Discharge: Friend(s);Available PRN/intermittently           Home Equipment: Kasandra Knudsen - single point      Prior Function Level of Independence: Independent         Comments: stopped using cane 5 weeks ago     Hand Dominance        Extremity/Trunk Assessment   Upper Extremity Assessment: Generalized weakness           Lower Extremity Assessment: Overall WFL for tasks assessed      Cervical /  Trunk Assessment: Kyphotic  Communication   Communication: No difficulties  Cognition Arousal/Alertness: Awake/alert Behavior During Therapy: WFL for tasks assessed/performed Overall Cognitive Status: Within Functional Limits for tasks  assessed                      General Comments General comments (skin integrity, edema, etc.): Pt describes onset of vertigo with position changes after she fell and hit her head. Reports episodes last <45 seconds. Tested for horizontal canal BPPV with supine head roll Rt and Lt (negative). +Rt posterior BPPV noted with Hallpike-Dix test. Treated with Epley maneuver for Rt BPPV. Negative Lt Hallpike-Dix. Pt reports having done OPPT for her balance previously and was told that she is "visually dependent."    Exercises        Assessment/Plan    PT Assessment All further PT needs can be met in the next venue of care  PT Diagnosis Difficulty walking;Other (comment) (BPPV)   PT Problem List Decreased balance  PT Treatment Interventions     PT Goals (Current goals can be found in the Care Plan section) Acute Rehab PT Goals PT Goal Formulation: All assessment and education complete, DC therapy    Frequency     Barriers to discharge        Co-evaluation               End of Session Equipment Utilized During Treatment: Gait belt Activity Tolerance: Patient tolerated treatment well Patient left: in chair;with call bell/phone within reach Nurse Communication: Mobility status    Functional Assessment Tool Used: clinical observation/judgement Functional Limitation: Mobility: Walking and moving around Mobility: Walking and Moving Around Current Status (D5947): At least 1 percent but less than 20 percent impaired, limited or restricted Mobility: Walking and Moving Around Goal Status [phone]): At least 1 percent but less than 20 percent impaired, limited or restricted Mobility: Walking and Moving Around Discharge Status [phone]): At least 1 percent but less than 20 percent impaired, limited or restricted    Time: 0908-0945 PT Time Calculation (min): 37 min   Charges:   PT Evaluation $Initial PT Evaluation Tier I: 1 Procedure PT Treatments $Gait Training: 8-22  mins $Canalith Rep Proc: 8-22 mins   PT G Codes:   Functional Assessment Tool Used: clinical observation/judgement Functional Limitation: Mobility: Walking and moving around    Rasaan Brotherton 09/01/2014, 10:23 AM Pager [phone]

## 2014-09-01 NOTE — Plan of Care (Signed)
Problem: Discharge/Transitional Outcomes Goal: Independent mobility/functioning independent or with min Independent mobility/functioning independently or with minimal assistance  Outcome: Completed/Met Date Met:  09/01/14
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
## 2014-09-01 NOTE — Progress Notes (Signed)
D/C orders received. Pt educated on d/c instructions. Verbalized understanding. Pt handed d/c packet. IV and tele removed. Pt taken downstairs by staff via wheelchair.
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
## 2014-09-05 LAB — COPPER, SERUM
Copper: 121 ug/dL (ref 70–175)
Copper: 127 ug/dL (ref 70–175)

## 2014-09-06 ENCOUNTER — Ambulatory Visit (INDEPENDENT_AMBULATORY_CARE_PROVIDER_SITE_OTHER): Payer: Medicaid Other | Admitting: Pharmacist

## 2014-09-06 DIAGNOSIS — Z7901 Long term (current) use of anticoagulants: Secondary | ICD-10-CM

## 2014-09-06 DIAGNOSIS — Z5181 Encounter for therapeutic drug level monitoring: Secondary | ICD-10-CM

## 2014-09-06 LAB — POCT INR: INR: 2.2

## 2014-09-11 NOTE — Telephone Encounter (Signed)
Dr Christella HartiganJacobs the pt was just released from the hospital due to a concussion and states she had a lot of test done, does she need to have this CT?  Please advise

## 2014-09-11 NOTE — Telephone Encounter (Signed)
Yes, she still needs the CT abdomen.

## 2014-09-13 NOTE — Telephone Encounter (Signed)
I spoke to the pt and she [facility], I called to set that up and they require the order faxed and pre certed before it can be scheduled.  I gave Karolee StampsJanelle the order and she will pre cert and I will [facility] and get scheduled.

## 2014-09-13 NOTE — Telephone Encounter (Signed)
The pt returned call and states her PCP called and they are setting her up for a CT on 10/03/14 of a mass that was found and will do the CT abd at the same time.  They will fax results to us after resulted.

## 2014-09-15 ENCOUNTER — Ambulatory Visit: Payer: Self-pay | Admitting: Diagnostic Neuroimaging

## 2014-09-20 ENCOUNTER — Encounter: Payer: Self-pay | Admitting: Diagnostic Neuroimaging

## 2014-09-20 ENCOUNTER — Ambulatory Visit: Payer: Self-pay | Admitting: Diagnostic Neuroimaging

## 2014-09-20 ENCOUNTER — Ambulatory Visit (INDEPENDENT_AMBULATORY_CARE_PROVIDER_SITE_OTHER): Payer: Medicaid Other | Admitting: Diagnostic Neuroimaging

## 2014-09-20 VITALS — BP 121/75 | HR 54 | Ht 60.0 in | Wt 190.2 lb

## 2014-09-20 DIAGNOSIS — G459 Transient cerebral ischemic attack, unspecified: Secondary | ICD-10-CM

## 2014-09-20 DIAGNOSIS — M5416 Radiculopathy, lumbar region: Secondary | ICD-10-CM

## 2014-09-20 DIAGNOSIS — G609 Hereditary and idiopathic neuropathy, unspecified: Secondary | ICD-10-CM

## 2014-09-20 NOTE — Patient Instructions (Signed)
Secondary to mechanical fall although patient does have a history of osteoporosis.  - Neurosurgery following; appreciate their recommendations - Bedrest until cleared for ambulation 
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
## 2014-10-03 ENCOUNTER — Other Ambulatory Visit: Payer: Self-pay | Admitting: Internal Medicine

## 2014-10-04 ENCOUNTER — Ambulatory Visit (INDEPENDENT_AMBULATORY_CARE_PROVIDER_SITE_OTHER): Payer: Medicaid Other

## 2014-10-04 DIAGNOSIS — Z7901 Long term (current) use of anticoagulants: Secondary | ICD-10-CM

## 2014-10-04 DIAGNOSIS — Z5181 Encounter for therapeutic drug level monitoring: Secondary | ICD-10-CM

## 2014-10-04 LAB — POCT INR: INR: 1.2

## 2014-10-11 ENCOUNTER — Ambulatory Visit (INDEPENDENT_AMBULATORY_CARE_PROVIDER_SITE_OTHER): Payer: Medicaid Other

## 2014-10-11 DIAGNOSIS — Z5181 Encounter for therapeutic drug level monitoring: Secondary | ICD-10-CM

## 2014-10-11 DIAGNOSIS — Z7901 Long term (current) use of anticoagulants: Secondary | ICD-10-CM

## 2014-10-11 LAB — POCT INR: INR: 1.9

## 2014-11-08 ENCOUNTER — Ambulatory Visit (INDEPENDENT_AMBULATORY_CARE_PROVIDER_SITE_OTHER): Payer: Medicaid Other

## 2014-11-08 DIAGNOSIS — Z5181 Encounter for therapeutic drug level monitoring: Secondary | ICD-10-CM

## 2014-11-08 DIAGNOSIS — Z7901 Long term (current) use of anticoagulants: Secondary | ICD-10-CM

## 2014-11-08 LAB — POCT INR: INR: 1.7

## 2014-11-29 ENCOUNTER — Ambulatory Visit (INDEPENDENT_AMBULATORY_CARE_PROVIDER_SITE_OTHER): Payer: Medicaid Other

## 2014-11-29 DIAGNOSIS — Z5181 Encounter for therapeutic drug level monitoring: Secondary | ICD-10-CM

## 2014-11-29 DIAGNOSIS — Z7901 Long term (current) use of anticoagulants: Secondary | ICD-10-CM

## 2014-11-29 LAB — POCT INR
INR: 3.1
INR: 3.1

## 2014-11-30 ENCOUNTER — Encounter: Payer: Self-pay | Admitting: *Deleted

## 2014-12-02 ENCOUNTER — Other Ambulatory Visit: Payer: Self-pay | Admitting: Internal Medicine

## 2014-12-21 ENCOUNTER — Encounter: Payer: Self-pay | Admitting: *Deleted

## 2014-12-30 ENCOUNTER — Emergency Department (HOSPITAL_COMMUNITY)
Admission: EM | Admit: 2014-12-30 | Discharge: 2014-12-31 | Disposition: A | Discharge status: Home / Self Care | Payer: Medicaid Other | Attending: Emergency Medicine | Admitting: Emergency Medicine

## 2014-12-30 ENCOUNTER — Emergency Department (HOSPITAL_COMMUNITY): Payer: Medicaid Other

## 2014-12-30 ENCOUNTER — Encounter (HOSPITAL_COMMUNITY): Payer: Self-pay | Admitting: Physical Medicine and Rehabilitation

## 2014-12-30 DIAGNOSIS — Z7951 Long term (current) use of inhaled steroids: Secondary | ICD-10-CM | POA: Insufficient documentation

## 2014-12-30 DIAGNOSIS — Z8669 Personal history of other diseases of the nervous system and sense organs: Secondary | ICD-10-CM | POA: Insufficient documentation

## 2014-12-30 DIAGNOSIS — R0789 Other chest pain: Secondary | ICD-10-CM | POA: Diagnosis not present

## 2014-12-30 DIAGNOSIS — Z8739 Personal history of other diseases of the musculoskeletal system and connective tissue: Secondary | ICD-10-CM | POA: Insufficient documentation

## 2014-12-30 DIAGNOSIS — Z950 Presence of cardiac pacemaker: Secondary | ICD-10-CM | POA: Diagnosis not present

## 2014-12-30 DIAGNOSIS — Z7901 Long term (current) use of anticoagulants: Secondary | ICD-10-CM | POA: Diagnosis not present

## 2014-12-30 DIAGNOSIS — Z86711 Personal history of pulmonary embolism: Secondary | ICD-10-CM | POA: Insufficient documentation

## 2014-12-30 DIAGNOSIS — F419 Anxiety disorder, unspecified: Secondary | ICD-10-CM | POA: Diagnosis not present

## 2014-12-30 DIAGNOSIS — I10 Essential (primary) hypertension: Secondary | ICD-10-CM | POA: Insufficient documentation

## 2014-12-30 DIAGNOSIS — E669 Obesity, unspecified: Secondary | ICD-10-CM | POA: Insufficient documentation

## 2014-12-30 DIAGNOSIS — Z87440 Personal history of urinary (tract) infections: Secondary | ICD-10-CM | POA: Diagnosis not present

## 2014-12-30 DIAGNOSIS — F329 Major depressive disorder, single episode, unspecified: Secondary | ICD-10-CM | POA: Diagnosis not present

## 2014-12-30 DIAGNOSIS — Z8719 Personal history of other diseases of the digestive system: Secondary | ICD-10-CM | POA: Diagnosis not present

## 2014-12-30 DIAGNOSIS — Z880 Allergy status to penicillin: Secondary | ICD-10-CM | POA: Insufficient documentation

## 2014-12-30 DIAGNOSIS — Z79899 Other long term (current) drug therapy: Secondary | ICD-10-CM | POA: Diagnosis not present

## 2014-12-30 DIAGNOSIS — R05 Cough: Secondary | ICD-10-CM | POA: Diagnosis present

## 2014-12-30 LAB — CBC WITH DIFFERENTIAL/PLATELET
BASOS ABS: 0 10*3/uL (ref 0.0–0.1)
Basophils Relative: 1 % (ref 0–1)
EOS PCT: 1 % (ref 0–5)
Eosinophils Absolute: 0 10*3/uL (ref 0.0–0.7)
HCT: 38.2 % (ref 36.0–46.0)
HEMOGLOBIN: 12.6 g/dL (ref 12.0–15.0)
LYMPHS ABS: 4.2 10*3/uL — AB (ref 0.7–4.0)
Lymphocytes Relative: 66 % — ABNORMAL HIGH (ref 12–46)
MCH: 31.8 pg (ref 26.0–34.0)
MCHC: 33 g/dL (ref 30.0–36.0)
MCV: 96.5 fL (ref 78.0–100.0)
Monocytes Absolute: 0.8 10*3/uL (ref 0.1–1.0)
Monocytes Relative: 13 % — ABNORMAL HIGH (ref 3–12)
Neutro Abs: 1.3 10*3/uL — ABNORMAL LOW (ref 1.7–7.7)
Neutrophils Relative %: 20 % — ABNORMAL LOW (ref 43–77)
PLATELETS: 307 10*3/uL (ref 150–400)
RBC: 3.96 MIL/uL (ref 3.87–5.11)
RDW: 14.4 % (ref 11.5–15.5)
WBC: 6.5 10*3/uL (ref 4.0–10.5)

## 2014-12-30 LAB — COMPREHENSIVE METABOLIC PANEL
ALT: 16 U/L (ref 0–35)
AST: 21 U/L (ref 0–37)
Albumin: 3.3 g/dL — ABNORMAL LOW (ref 3.5–5.2)
Alkaline Phosphatase: 54 U/L (ref 39–117)
Anion gap: 5 (ref 5–15)
BILIRUBIN TOTAL: 0.8 mg/dL (ref 0.3–1.2)
BUN: 11 mg/dL (ref 6–23)
CALCIUM: 8.5 mg/dL (ref 8.4–10.5)
CO2: 27 mmol/L (ref 19–32)
CREATININE: 0.69 mg/dL (ref 0.50–1.10)
Chloride: 108 mmol/L (ref 96–112)
GFR calc non Af Amer: >90 mL/min (ref >90)
GLUCOSE: 85 mg/dL (ref 70–99)
Potassium: 4.1 mmol/L (ref 3.5–5.1)
SODIUM: 140 mmol/L (ref 135–145)
Total Protein: 6.5 g/dL (ref 6.0–8.3)

## 2014-12-30 LAB — I-STAT TROPONIN, ED: TROPONIN I, POC: 0 ng/mL (ref 0.00–0.08)

## 2014-12-30 NOTE — ED Notes (Signed)
Pt presents to department for evaluation of diffuse chest pain, dry cough and fatigue. Ongoing x2 days. Pt states symptoms became worse today. 7/10 chest tightness at the time. Pt is alert and oriented x4.

## 2014-12-30 NOTE — ED Notes (Signed)
Pt disclosed that she had a hand gun in her purse that she forgot about prior to coming to the hospital. Pt denied thoughts or feelings of harming herself or anyone else. [employer] and security notified.

## 2014-12-30 NOTE — ED Notes (Signed)
Pt placed in a gown with 5 lead, BP cuff and pulsed ox hooked up

## 2014-12-30 NOTE — ED Provider Notes (Signed)
CSN: [id number]     Arrival date & time 12/30/14  1840 History  This chart was scribed for Gerhard Munch, MD by Karle Plumber, ED Scribe. This patient was seen in room B16C/B16C and the patient's care was started at 11:50 PM.  Chief Complaint  Patient presents with  . Chest Pain  . Cough   Patient is a 59 y.o. female presenting with chest pain and cough. The history is provided by the patient and medical records. No language interpreter was used.  Chest Pain Associated symptoms: cough   Associated symptoms: not vomiting   Cough Associated symptoms: chest pain     HPI Comments:  Susan Howell is a 59 y.o. female who presents to the Emergency Department complaining of worsening left-sided sternal CP that started earlier today. She reports congestion, cough, sore throat, otalgia, HA, fever (101.7 degrees) that began three days ago. She states she had an episode of light headedness earlier today as well. Rates her CP at 7/10. Pt reports taking OTC children's cold medication and Robitussin with no significant relief of her symptoms. Pt reports she is currently on Coumadin secondary to having a pulmonary embolism two years ago. She states she is on home oxygen per nasal canula at night. Denies syncope, dizziness, confusion, skin color changes, new rashes. PMHx of anxiety, depression, obesity, HTN, palpitations, pulmonary embolism, sinoatrial node dysfunction, arthritis, fibromyalgia, orthostatic hypotension and acute stress reaction.   Past Medical History  Diagnosis Date  . Anxiety   . Depression   . Obesity   . HTN (hypertension)   . Obesity   . Cystitis   . Palpitations   . Lung nodules   . History of urinary tract infection   . Pulmonary embolism     a. 2/2 gastric bypass, 1979. b. 12/12/2012  . H/O diastolic dysfunction     a. Echo 12/13/12: EF 65-70%, G2DD, pulm press . b. Echo 02/03/13: 50-55%, no WMA, lef ventricular diastolic function nl, pulm arteries w/in nl range, mild  LA dilation.    . Sleep apnea     a. no cpap now uses 02 2L at night  . Pacemaker 2009    a. Dr. Ladona Ridgel. b. Medtr[employer], SN# [id number], est longevity 10.5 years  . Sinoatrial node dysfunction 2009  . Headache(784.0)   . Motility disorder, esophageal   . Arthritis   . Fibromyalgia   . Thyroid nodule   . Acute stress reaction     Husband passed 2/2 car falling on him that he was working on 2014   . Orthostatic hypotension    Past Surgical History  Procedure Laterality Date  . Pacemaker placement      x2, pacemaker leads replaced  . Appendectomy    . Cholecystectomy    . 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 Gastric bypass    . Stomach surgery      "2/3 of stomach removed"  . Abdominal hysterectomy    . Ectopic pregnancy surgery    . Foot surgery      both feet, and ankles  . Knee surgery Bilateral   . Rib resection    . Abdominal surgery      muscle deteriorated in abdomen  . Eus N/A 03/03/2013    Procedure: UPPER ENDOSCOPIC ULTRASOUND (EUS) LINEAR;  Surgeon: Rachael Fee, MD;  Location: [facility];  Service: Endoscopy;  Laterality: N/A;  . Laminectomy     Family History  Problem Relation Age of Onset  . Colon cancer Neg Hx   . Throat cancer Neg Hx   . Esophageal cancer Neg Hx   . Stomach cancer Neg Hx   . Liver disease Father     PBC  . Kidney disease Neg Hx   . Diabetes Brother     twin  . Heart attack Brother    History  Substance Use Topics  . Smoking status: Never Smoker   . Smokeless tobacco: Never Used  . Alcohol Use: No   OB History    No data available     Review of Systems  Constitutional:       Per HPI, otherwise negative  HENT:       Per HPI, otherwise negative  Respiratory: Positive for cough.        Per HPI, otherwise negative  Cardiovascular: Positive for chest pain.       Per HPI, otherwise negative  Gastrointestinal: Negative for vomiting.  Endocrine:       Negative aside  from HPI  Genitourinary:       Neg aside from HPI   Musculoskeletal:       Per HPI, otherwise negative  Skin: Negative.   Neurological: Negative for syncope.    Allergies  Iodine; Labetalol hcl; Bactrim; Eszopiclone; Iohexol; Penicillins; Smallpox vaccine; Tetanus toxoids; Topamax; Vancomycin; Aliskiren fumarate; and Codeine  Home Medications   Prior to Admission medications   Medication Sig Start Date End Date Taking? Authorizing Provider  albuterol (PROAIR HFA) 108 (90 BASE) MCG/ACT inhaler Inhale 2 puffs into the lungs every 6 (six) hours as needed for wheezing.   Yes Historical Provider, MD  carisoprodol (SOMA) 350 MG tablet Take 350 mg by mouth at bedtime as needed for muscle spasms.    Yes Historical Provider, MD  clonazePAM (KLONOPIN) 1 MG tablet Take 1 mg by mouth 2 (two) times daily.   Yes Historical Provider, MD  cyanocobalamin 1000 MCG tablet Take 1,000 mcg by mouth daily.    Yes Historical Provider, MD  docusate sodium (COLACE) 50 MG capsule Take 100 mg by mouth 2 (two) times daily.    Yes Historical Provider, MD  fluticasone (FLONASE) 50 MCG/ACT nasal spray Place 2 sprays into both nostrils daily.   Yes Historical Provider, MD  furosemide (LASIX) 40 MG tablet Take 10 mg by mouth 2 (two) times a week.    Yes Historical Provider, MD  lisinopril (PRINIVIL,ZESTRIL) 5 MG tablet Take 1.25 mg by mouth as needed (for extremely elevated blood pressures).    Yes Historical Provider, MD  metoprolol (LOPRESSOR) 50 MG tablet Take 50 mg by mouth 2 (two) times daily.    Yes Historical Provider, MD  nystatin (MYCOSTATIN) 100000 UNIT/ML suspension Take 5 mLs by mouth 4 (four) times daily as needed (takes when taking antibiotics).    Yes Historical Provider, MD  Oxycodone HCl 10 MG TABS Take 10 mg by mouth every 6 (six) hours as needed (for pain).   Yes Historical Provider, MD  polyethylene glycol (MIRALAX / GLYCOLAX) packet Take 17 g by mouth daily.   Yes Historical Provider, MD  traZODone  (DESYREL) 150 MG tablet Take 150 mg by mouth at bedtime.   Yes Historical Provider, MD  warfarin (COUMADIN) 5 MG tablet Take 5 mg by mouth daily. Take 5 mg every Sun / Tues / Thurs   Yes Historical Provider, MD  warfarin (COUMADIN) 7.5 MG tablet Take 7.5 mg by mouth every Monday, Wednesday, Friday, and Saturday at  6 PM. Take on Mon / Wed / Fri / Sat   Yes Historical Provider, MD  warfarin (COUMADIN) 5 MG tablet TAKE AS DIRECTED BY ANTICOAGULATION CLINIC Patient not taking: Reported on 12/30/2014 10/03/14   Duke Salvia, MD  warfarin (COUMADIN) 5 MG tablet TAKE AS DIRECTED BY ANTICOAGULATION CLINIC Patient not taking: Reported on 12/30/2014 12/04/14   Duke Salvia, MD   Triage Vitals: BP 144/87 mmHg  Pulse 65  Temp(Src) 98.4 F (36.9 C) (Oral)  Resp 10  Ht 5' (1.524 m)  Wt 194 lb (87.998 kg)  BMI 37.89 kg/m2  SpO2 99% Physical Exam  Constitutional: She is oriented to person, place, and time. She appears well-developed and well-nourished. No distress.  HENT:  Head: Normocephalic and atraumatic.  Eyes: Conjunctivae and EOM are normal.  Cardiovascular: Normal rate, regular rhythm and normal heart sounds.  Exam reveals no gallop and no friction rub.   No murmur heard. Pulmonary/Chest: Effort normal and breath sounds normal. No stridor. No respiratory distress. She has no wheezes. She has no rales.  Abdominal: She exhibits no distension.  Musculoskeletal: She exhibits no edema.  Neurological: She is alert and oriented to person, place, and time. No cranial nerve deficit.  Skin: Skin is warm and dry.  Psychiatric: Her mood appears anxious.  Nursing note and vitals reviewed.   ED Course  Procedures (including critical care time) DIAGNOSTIC STUDIES: Oxygen Saturation is 99% on RA, normal by my interpretation.   COORDINATION OF CARE: 11:59 PM- Will order pain medication and labs. Pt verbalizes understanding and agrees to plan.  Medications  HYDROmorphone (DILAUDID) injection 1 mg (1 mg  Intravenous Given 12/31/14 0035)  ketorolac (TORADOL) 30 MG/ML injection 30 mg (30 mg Intravenous Given 12/31/14 0035)  sodium chloride 0.9 % bolus 1,000 mL (1,000 mLs Intravenous New Bag/Given 12/31/14 0036)    Labs Review Labs Reviewed  CBC WITH DIFFERENTIAL/PLATELET - Abnormal; Notable for the following:    Neutrophils Relative % 20 (*)    Neutro Abs 1.3 (*)    Lymphocytes Relative 66 (*)    Lymphs Abs 4.2 (*)    Monocytes Relative 13 (*)    All other components within normal limits  COMPREHENSIVE METABOLIC PANEL - Abnormal; Notable for the following:    Albumin 3.3 (*)    All other components within normal limits  PROTIME-INR - Abnormal; Notable for the following:    Prothrombin Time 17.8 (*)    All other components within normal limits  TROPONIN I  Susan Howell, ED    Imaging Review Dg Chest 2 View  12/30/2014   CLINICAL DATA:  Lightheaded. Chills and fever. Bronchitis like symptoms. Cough.  EXAM: CHEST  2 VIEW  COMPARISON:  08/31/2014  FINDINGS: Thoracolumbar spine fixation. Hyperinflation with accentuation of expected thoracic kyphosis. Osteopenia. Pacer with leads at right atrium and right ventricle. No lead discontinuity. Midline trachea. No pleural effusion or pneumothorax. Clear lungs.  IMPRESSION: No acute cardiopulmonary disease.   Electronically Signed   By: Jeronimo Greaves M.D.   On: 12/30/2014 21:11     EKG Interpretation   Date/Time:  Saturday December 30 2014 18:46:39 EST Ventricular Rate:  70 PR Interval:  244 QRS Duration: 140 QT Interval:  416 QTC Calculation: 449 R Axis:   39 Text Interpretation:  Sinus rhythm with 1st degree A-V block Right bundle  branch block Abnormal ECG Sinus rhythm with 1st degree A-V block Right  bundle branch block No significant change since last tracing Abnormal ekg  Confirmed by Gerhard MunchLOCKWOOD, Corrion Stirewalt  MD [phone]([id number]) on 12/30/2014 11:29:00 PM       2:51 AM Patient aware of all results - including sub-therapeutic INR MDM  Patient presents  with concern of ongoing respiratory discomfort, and new chest pain. Here the patient is awake and alert, hemodynamically stable, with minimal complaints during hours of monitoring. Patient's evaluation does not demonstrate evidence for ongoing coronary ischemia, with 2 negative troponins, unremarkable EKG. Patient was discharged in stable condition, with low suspicion for ACS, bacteremia, occult infection, or other acute new pathology.     I personally performed the services described in this documentation, which was scribed in my presence. The recorded information has been reviewed and is accurate.    Gerhard Munchobert Danyella Mcginty, MD 12/31/14 [phone]

## 2014-12-31 LAB — PROTIME-INR
INR: 1.45 (ref 0.00–1.49)
Prothrombin Time: 17.8 seconds — ABNORMAL HIGH (ref 11.6–15.2)

## 2014-12-31 LAB — TROPONIN I

## 2014-12-31 MED ORDER — SODIUM CHLORIDE 0.9 % IV BOLUS (SEPSIS)
1000.0000 mL | Freq: Once | INTRAVENOUS | Status: AC
  Administered 2014-12-31: 1000 mL via INTRAVENOUS

## 2014-12-31 MED ORDER — HYDROCOD POLST-CHLORPHEN POLST 10-8 MG/5ML PO LQCR
5.0000 mL | Freq: Two times a day (BID) | ORAL | Status: DC | PRN
Start: 2014-12-31 — End: 2015-01-19

## 2014-12-31 MED ORDER — KETOROLAC TROMETHAMINE 30 MG/ML IJ SOLN
30.0000 mg | Freq: Once | INTRAMUSCULAR | Status: AC
  Administered 2014-12-31: 30 mg via INTRAVENOUS
  Filled 2014-12-31: qty 1

## 2014-12-31 MED ORDER — HYDROMORPHONE HCL 1 MG/ML IJ SOLN
1.0000 mg | Freq: Once | INTRAMUSCULAR | Status: AC
Start: 2014-12-31 — End: 2014-12-31
  Administered 2014-12-31: 1 mg via INTRAVENOUS
  Filled 2014-12-31: qty 1

## 2014-12-31 NOTE — ED Notes (Signed)
Patient's firearm secured by security in safe. Key currently in custody of this RN. Patient informed of policy for return of belonging, understanding verbalized. Disposition currently pending.

## 2014-12-31 NOTE — ED Notes (Signed)
Security escorted pt and this nurse off of unit with pts secured gun in securities possession.

## 2014-12-31 NOTE — Discharge Instructions (Signed)
As discussed, your evaluation today has been largely reassuring.  But, it is important that you monitor your condition carefully, and do not hesitate to return to the ED if you develop new, or concerning changes in your condition. ? ?Otherwise, please follow-up with your physician for appropriate ongoing care. ? ?

## 2015-01-03 ENCOUNTER — Ambulatory Visit (INDEPENDENT_AMBULATORY_CARE_PROVIDER_SITE_OTHER): Payer: Medicaid Other

## 2015-01-03 DIAGNOSIS — Z5181 Encounter for therapeutic drug level monitoring: Secondary | ICD-10-CM

## 2015-01-03 LAB — POCT INR: INR: 2.2

## 2015-01-19 ENCOUNTER — Encounter: Payer: Self-pay | Admitting: Internal Medicine

## 2015-01-19 ENCOUNTER — Ambulatory Visit (INDEPENDENT_AMBULATORY_CARE_PROVIDER_SITE_OTHER): Payer: Medicaid Other | Admitting: Internal Medicine

## 2015-01-19 VITALS — BP 146/84 | HR 50 | Ht 60.0 in | Wt 198.6 lb

## 2015-01-19 DIAGNOSIS — I495 Sick sinus syndrome: Secondary | ICD-10-CM | POA: Diagnosis not present

## 2015-01-19 DIAGNOSIS — Z45018 Encounter for adjustment and management of other part of cardiac pacemaker: Secondary | ICD-10-CM

## 2015-01-19 NOTE — Progress Notes (Signed)
Patient Care Team: Pcp Not In System as PCP - General   HPI  Susan Howell is a 59 y.o. female Seen in followup for a pacemaker inserted for sinus node dysfunction.  She has a history of hypertension. She was seen 10/14 with complaints of edema.A venous duplex was ordered and salt avoidance was recommended. Duplex was negative  It has now been about 19 months since her husband died.  She has lost about 60 pounds. She is feeling better.  She was recently hospitalized because of chest pain. Cardiac enzymes were negative.  She is interested in stopping her Coumadin.       Past Medical History  Diagnosis Date  . Anxiety   . Depression   . Obesity   . HTN (hypertension)   . Obesity   . Cystitis   . Palpitations   . Lung nodules   . History of urinary tract infection   . Pulmonary embolism     a. 2/2 gastric bypass, 1979. b. 12/12/2012  . H/O diastolic dysfunction     a. Echo 12/13/12: EF 65-70%, G2DD, pulm press 38mmHg. b. Echo 02/03/13: 50-55%, no WMA, lef ventricular diastolic function nl, pulm arteries w/in nl range, mild LA dilation.    . Sleep apnea     a. no cpap now uses 02 2L at night  . Pacemaker 2009    a. Dr. Ladona Ridgelaylor. b. [employer]tr[employer], SN# [id number] H, est longevity 10.5 years  . Sinoatrial node dysfunction 2009  . Headache(784.0)   . Motility disorder, esophageal   . Arthritis   . Fibromyalgia   . Thyroid nodule   . Acute stress reaction     Husband passed 2/2 car falling on him that he was working on 2014   . Orthostatic hypotension     Past Surgical History  Procedure Laterality Date  . Pacemaker placement      x2, pacemaker leads replaced  . Appendectomy    . Cholecystectomy    . 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 Gastric bypass    . Stomach surgery      "2/3 of stomach removed"  . Abdominal hysterectomy    . Ectopic pregnancy surgery    . Foot surgery      both feet, and ankles    . Knee surgery Bilateral   . Rib resection    . Abdominal surgery      muscle deteriorated in abdomen  . Eus N/A 03/03/2013    Procedure: UPPER ENDOSCOPIC ULTRASOUND (EUS) LINEAR;  Surgeon: Rachael Feeaniel P Jacobs, MD;  Location: [facility];  Service: Endoscopy;  Laterality: N/A;  . Laminectomy      Current Outpatient Prescriptions  Medication Sig Dispense Refill  . albuterol (PROAIR HFA) 108 (90 BASE) MCG/ACT inhaler Inhale 2 puffs into the lungs every 6 (six) hours as needed for wheezing.    . clonazePAM (KLONOPIN) 1 MG tablet Take 1 mg by mouth 2 (two) times daily.    . cyanocobalamin 1000 MCG tablet Take 1,000 mcg by mouth daily.     Marland Kitchen. docusate sodium (COLACE) 50 MG capsule Take 100 mg by mouth 2 (two) times daily.     . fluticasone (FLONASE) 50 MCG/ACT nasal spray Place 2 sprays into both nostrils daily.    . furosemide (LASIX) 40 MG tablet Take 10 mg by mouth 2 (two) times a week.     Marland Kitchen. lisinopril (PRINIVIL,ZESTRIL) 5 MG tablet  Take 1.25 mg by mouth as needed (for extremely elevated blood pressures).     . metoprolol (LOPRESSOR) 50 MG tablet Take 50 mg by mouth 2 (two) times daily.     Marland Kitchen nystatin (MYCOSTATIN) 100000 UNIT/ML suspension Take 5 mLs by mouth 4 (four) times daily as needed (takes when taking antibiotics).     . Oxycodone HCl 10 MG TABS Take 10 mg by mouth every 6 (six) hours as needed (for pain).    . traZODone (DESYREL) 150 MG tablet Take 150 mg by mouth at bedtime.    Marland Kitchen warfarin (COUMADIN) 5 MG tablet Take 5 mg by mouth daily. Take 5 mg every Sun / Tues / Thurs    . warfarin (COUMADIN) 7.5 MG tablet Take 7.5 mg by mouth every Monday, Wednesday, Friday, and Saturday at 6 PM. Take on Mon / Wed / Fri / Sat     No current facility-administered medications for this visit.    Allergies  Allergen Reactions  . Iodine Other (See Comments)    seizures  . Labetalol Hcl Hypertension  . Bactrim [Sulfamethoxazole-Trimethoprim] Other (See Comments)    R/T Coumadin  . Eszopiclone  Other (See Comments)    REACTION: NIGHT MARES  . Iohexol Nausea And Vomiting and Other (See Comments)     Desc: NAUSEA,VOMITING & SZ S/P CONTRAST INJ 30 YRS AGO// OK W/ PRE MEDS, BENADRYL & SOLUMEDROL TODAY//A.C., Onset Date: [id number]   . Penicillins Nausea And Vomiting    Cannot take tablets by mouth; IV is fine.   . Smallpox Vaccine Other (See Comments)    Swelling at injection site, fever  . Tetanus Toxoids Swelling and Other (See Comments)    Fever, flushing and hardening of the injection site  . Topamax Other (See Comments)    Confusion, high blood pressure.   . Vancomycin Nausea Only    Severe chills  . Aliskiren Fumarate Other (See Comments)    Caused SYNCOPE  . Codeine Itching    Review of Systems negative except from HPI and PMH  Physical Exam BP 146/84 mmHg  Pulse 50  Ht 5' (1.524 m)  Wt 198 lb 9.6 oz (90.084 kg)  BMI 38.79 kg/m2 Morbidly obese tearful Caucasian woman HENT normal Neck supple  Clear Regular rate and rhythm, no murmurs or gallops Abd-soft   No Clubbing cyanosis edema Skin-warm and dry A & Oriented  Grossly normal sensory and motor function  ECG demonstrates sinus rhythm at 59 Intervals 27/13/47   Assessment and  Plan  Sinus node dysfunction  Elevated blood pressure  Pacemaker-Medtronic  Device function is normal. She has some bradycardia. She has lost about 60 pounds. She feels some better.

## 2015-01-19 NOTE — Patient Instructions (Addendum)
Your physician recommends that you continue on your current medications as directed. Please refer to the Current Medication list given to you today.  Remote monitoring is used to monitor your pacemaker from home. This monitoring reduces the number of office visits required to check your device to one time per year. It allows us to keep an eye on the functioning of your device to ensure it is working properly. You are scheduled for a device check from home on 04/23/2015. You may send your transmission at any time that day. If you have a wireless device, the transmission will be sent automatically. After your physician reviews your transmission, you will receive a postcard with your next transmission date.  Your physician recommends that you schedule a follow-up appointment in:12 months with Dr.Klein

## 2015-01-24 ENCOUNTER — Ambulatory Visit (INDEPENDENT_AMBULATORY_CARE_PROVIDER_SITE_OTHER): Payer: Medicaid Other

## 2015-01-24 DIAGNOSIS — Z5181 Encounter for therapeutic drug level monitoring: Secondary | ICD-10-CM | POA: Diagnosis not present

## 2015-01-24 LAB — POCT INR: INR: 1.2

## 2015-01-25 LAB — MDC_IDC_ENUM_SESS_TYPE_INCLINIC
Battery Impedance: 357 Ohm
Battery Voltage: 2.79 V
Brady Statistic AP VP Percent: 0.1 %
Brady Statistic AP VS Percent: 11.4 %
Brady Statistic AS VP Percent: 0.4 %
Brady Statistic AS VS Percent: 88.1 %
Lead Channel Impedance Value: 665 Ohm
Lead Channel Impedance Value: 745 Ohm
Lead Channel Pacing Threshold Amplitude: 0.75 V
Lead Channel Pacing Threshold Amplitude: 1.25 V
Lead Channel Pacing Threshold Pulse Width: 0.4 ms
Lead Channel Sensing Intrinsic Amplitude: 11.2 mV
Lead Channel Sensing Intrinsic Amplitude: 2.8 mV
Lead Channel Setting Pacing Amplitude: 2.5 V
Lead Channel Setting Pacing Pulse Width: 1 ms
MDC IDC MSMT LEADCHNL RV PACING THRESHOLD PULSEWIDTH: 1 ms
MDC IDC SET LEADCHNL RA PACING AMPLITUDE: 3 V
MDC IDC SET LEADCHNL RV SENSING SENSITIVITY: 4 mV

## 2015-02-07 ENCOUNTER — Ambulatory Visit (INDEPENDENT_AMBULATORY_CARE_PROVIDER_SITE_OTHER): Payer: Medicaid Other

## 2015-02-07 DIAGNOSIS — Z5181 Encounter for therapeutic drug level monitoring: Secondary | ICD-10-CM

## 2015-02-07 LAB — POCT INR: INR: 1.6

## 2015-02-21 ENCOUNTER — Ambulatory Visit (INDEPENDENT_AMBULATORY_CARE_PROVIDER_SITE_OTHER): Payer: Medicaid Other | Admitting: *Deleted

## 2015-02-21 DIAGNOSIS — Z5181 Encounter for therapeutic drug level monitoring: Secondary | ICD-10-CM

## 2015-02-21 LAB — POCT INR: INR: 4.2

## 2015-02-27 ENCOUNTER — Encounter: Payer: Medicaid Other | Admitting: Internal Medicine

## 2015-03-07 ENCOUNTER — Ambulatory Visit (INDEPENDENT_AMBULATORY_CARE_PROVIDER_SITE_OTHER): Payer: Medicaid Other

## 2015-03-07 ENCOUNTER — Other Ambulatory Visit: Payer: Self-pay | Admitting: Internal Medicine

## 2015-03-07 ENCOUNTER — Other Ambulatory Visit: Payer: Self-pay | Admitting: *Deleted

## 2015-03-07 DIAGNOSIS — Z5181 Encounter for therapeutic drug level monitoring: Secondary | ICD-10-CM | POA: Diagnosis not present

## 2015-03-07 LAB — POCT INR: INR: 2.3

## 2015-03-09 ENCOUNTER — Encounter (HOSPITAL_COMMUNITY): Payer: Self-pay | Admitting: Emergency Medicine

## 2015-03-09 ENCOUNTER — Emergency Department (HOSPITAL_COMMUNITY): Payer: Medicaid Other

## 2015-03-09 ENCOUNTER — Emergency Department (HOSPITAL_COMMUNITY)
Admission: EM | Admit: 2015-03-09 | Discharge: 2015-03-09 | Disposition: A | Discharge status: Home / Self Care | Payer: Medicaid Other | Attending: Emergency Medicine | Admitting: Emergency Medicine

## 2015-03-09 DIAGNOSIS — Z7901 Long term (current) use of anticoagulants: Secondary | ICD-10-CM | POA: Insufficient documentation

## 2015-03-09 DIAGNOSIS — Z79899 Other long term (current) drug therapy: Secondary | ICD-10-CM | POA: Insufficient documentation

## 2015-03-09 DIAGNOSIS — I10 Essential (primary) hypertension: Secondary | ICD-10-CM | POA: Diagnosis not present

## 2015-03-09 DIAGNOSIS — S8001XA Contusion of right knee, initial encounter: Secondary | ICD-10-CM

## 2015-03-09 DIAGNOSIS — Y9389 Activity, other specified: Secondary | ICD-10-CM | POA: Insufficient documentation

## 2015-03-09 DIAGNOSIS — F419 Anxiety disorder, unspecified: Secondary | ICD-10-CM | POA: Insufficient documentation

## 2015-03-09 DIAGNOSIS — S0990XA Unspecified injury of head, initial encounter: Secondary | ICD-10-CM | POA: Diagnosis present

## 2015-03-09 DIAGNOSIS — Z8739 Personal history of other diseases of the musculoskeletal system and connective tissue: Secondary | ICD-10-CM | POA: Diagnosis not present

## 2015-03-09 DIAGNOSIS — Z87440 Personal history of urinary (tract) infections: Secondary | ICD-10-CM | POA: Diagnosis not present

## 2015-03-09 DIAGNOSIS — S0003XA Contusion of scalp, initial encounter: Secondary | ICD-10-CM | POA: Diagnosis not present

## 2015-03-09 DIAGNOSIS — Z880 Allergy status to penicillin: Secondary | ICD-10-CM | POA: Diagnosis not present

## 2015-03-09 DIAGNOSIS — W19XXXA Unspecified fall, initial encounter: Secondary | ICD-10-CM

## 2015-03-09 DIAGNOSIS — W01198A Fall on same level from slipping, tripping and stumbling with subsequent striking against other object, initial encounter: Secondary | ICD-10-CM | POA: Diagnosis not present

## 2015-03-09 DIAGNOSIS — E669 Obesity, unspecified: Secondary | ICD-10-CM | POA: Diagnosis not present

## 2015-03-09 DIAGNOSIS — Y92009 Unspecified place in unspecified non-institutional (private) residence as the place of occurrence of the external cause: Secondary | ICD-10-CM | POA: Insufficient documentation

## 2015-03-09 DIAGNOSIS — Z8669 Personal history of other diseases of the nervous system and sense organs: Secondary | ICD-10-CM | POA: Diagnosis not present

## 2015-03-09 DIAGNOSIS — Z8719 Personal history of other diseases of the digestive system: Secondary | ICD-10-CM | POA: Diagnosis not present

## 2015-03-09 DIAGNOSIS — Y998 Other external cause status: Secondary | ICD-10-CM | POA: Insufficient documentation

## 2015-03-09 DIAGNOSIS — Z86711 Personal history of pulmonary embolism: Secondary | ICD-10-CM | POA: Insufficient documentation

## 2015-03-09 DIAGNOSIS — Z7951 Long term (current) use of inhaled steroids: Secondary | ICD-10-CM | POA: Insufficient documentation

## 2015-03-09 DIAGNOSIS — S0093XA Contusion of unspecified part of head, initial encounter: Secondary | ICD-10-CM

## 2015-03-09 DIAGNOSIS — W010XXA Fall on same level from slipping, tripping and stumbling without subsequent striking against object, initial encounter: Secondary | ICD-10-CM

## 2015-03-09 LAB — COMPREHENSIVE METABOLIC PANEL
ALK PHOS: 56 U/L (ref 38–126)
ALT: 15 U/L (ref 14–54)
AST: 29 U/L (ref 15–41)
Albumin: 3.4 g/dL — ABNORMAL LOW (ref 3.5–5.0)
Anion gap: 10 (ref 5–15)
BUN: 12 mg/dL (ref 6–20)
CO2: 27 mmol/L (ref 22–32)
CREATININE: 0.84 mg/dL (ref 0.44–1.00)
Calcium: 8.7 mg/dL — ABNORMAL LOW (ref 8.9–10.3)
Chloride: 102 mmol/L (ref 101–111)
GFR calc Af Amer: >60 mL/min (ref >60)
Glucose, Bld: 73 mg/dL (ref 65–99)
POTASSIUM: 4.7 mmol/L (ref 3.5–5.1)
SODIUM: 139 mmol/L (ref 135–145)
Total Bilirubin: 1.4 mg/dL — ABNORMAL HIGH (ref 0.3–1.2)
Total Protein: 7 g/dL (ref 6.5–8.1)

## 2015-03-09 LAB — CBC
HEMATOCRIT: 39.4 % (ref 36.0–46.0)
Hemoglobin: 12.9 g/dL (ref 12.0–15.0)
MCH: 31.5 pg (ref 26.0–34.0)
MCHC: 32.7 g/dL (ref 30.0–36.0)
MCV: 96.3 fL (ref 78.0–100.0)
PLATELETS: 270 10*3/uL (ref 150–400)
RBC: 4.09 MIL/uL (ref 3.87–5.11)
RDW: 14 % (ref 11.5–15.5)
WBC: 7.7 10*3/uL (ref 4.0–10.5)

## 2015-03-09 LAB — PROTIME-INR
INR: 1.82 — ABNORMAL HIGH (ref 0.00–1.49)
PROTHROMBIN TIME: 21.2 s — AB (ref 11.6–15.2)

## 2015-03-09 MED ORDER — MORPHINE SULFATE 4 MG/ML IJ SOLN
4.0000 mg | Freq: Once | INTRAMUSCULAR | Status: AC
  Administered 2015-03-09: 4 mg via INTRAVENOUS
  Filled 2015-03-09: qty 1

## 2015-03-09 MED ORDER — ONDANSETRON HCL 4 MG/2ML IJ SOLN
4.0000 mg | Freq: Once | INTRAMUSCULAR | Status: AC
  Administered 2015-03-09: 4 mg via INTRAVENOUS
  Filled 2015-03-09: qty 2

## 2015-03-09 NOTE — ED Provider Notes (Signed)
CSN: [id number]     Arrival date & time 03/09/15  1754 History   First MD Initiated Contact with Patient 03/09/15 1830     Chief Complaint  Patient presents with  . Fall  . Loss of Consciousness     (Consider location/radiation/quality/duration/timing/severity/associated sxs/prior Treatment) Patient is a 59 y.o. female presenting with fall and syncope. The history is provided by the patient.  Fall Associated symptoms include headaches. Pertinent negatives include no chest pain, no abdominal pain and no shortness of breath.  Loss of Consciousness Associated symptoms: headaches   Associated symptoms: no chest pain, no confusion, no fever, no shortness of breath, no vomiting and no weakness   Patient w hx PE, on coumadin, s/p fall at home last pm.  Pt states she tripped over cord. ?loc. Hit head. Dull frontal headache afterwards, moderate. Also c/o right knee pain, constant, dull, moderate, worse w palpation. Has been ambulatory. Skin intact, no lacs or abrasions. No other recent falls. Is on coumadin. No recent abn bleeding. States had inr check last week and was 2.1. Pt states otherwise recent health at baseline. Has chronic back pain/prior back surgery, takes morphine for same - no recent worsening of back pain. Denies neck pain. No numbness/weakness. No problems w speech or vision. No change in balance or normal functional ability.      Past Medical History  Diagnosis Date  . Anxiety   . Depression   . Obesity   . HTN (hypertension)   . Obesity   . Cystitis   . Palpitations   . Lung nodules   . History of urinary tract infection   . Pulmonary embolism     a. 2/2 gastric bypass, 1979. b. 12/12/2012  . H/O diastolic dysfunction     a. Echo 12/13/12: EF 65-70%, G2DD, pulm press . b. Echo 02/03/13: 50-55%, no WMA, lef ventricular diastolic function nl, pulm arteries w/in nl range, mild LA dilation.    . Sleep apnea     a. no cpap now uses 02 2L at night  . Pacemaker 2009   a. Dr. Ladona Ridgel. b. [employer], SN# [id number] H, est longevity 10.5 years  . Sinoatrial node dysfunction 2009  . Headache(784.0)   . Motility disorder, esophageal   . Arthritis   . Fibromyalgia   . Thyroid nodule   . Acute stress reaction     Husband passed 2/2 car falling on him that he was working on 2014   . Orthostatic hypotension    Past Surgical History  Procedure Laterality Date  . Pacemaker placement      x2, pacemaker leads replaced  . Appendectomy    . Cholecystectomy    . 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 Gastric bypass    . Stomach surgery      "2/3 of stomach removed"  . Abdominal hysterectomy    . Ectopic pregnancy surgery    . Foot surgery      both feet, and ankles  . Knee surgery Bilateral   . Rib resection    . Abdominal surgery      muscle deteriorated in abdomen  . Eus N/A 03/03/2013    Procedure: UPPER ENDOSCOPIC ULTRASOUND (EUS) LINEAR;  Surgeon: Rachael Fee, MD;  Location: [facility];  Service: Endoscopy;  Laterality: N/A;  . Laminectomy     Family History  Problem Relation Age of Onset  . Colon cancer Neg Hx   . Throat cancer  Neg Hx   . Esophageal cancer Neg Hx   . Stomach cancer Neg Hx   . Liver disease Father     PBC  . Kidney disease Neg Hx   . Diabetes Brother     twin  . Heart attack Brother    History  Substance Use Topics  . Smoking status: Never Smoker   . Smokeless tobacco: Never Used  . Alcohol Use: No   OB History    No data available     Review of Systems  Constitutional: Negative for fever and chills.  HENT: Negative for nosebleeds.   Eyes: Negative for pain and visual disturbance.  Respiratory: Negative for shortness of breath.   Cardiovascular: Positive for syncope. Negative for chest pain.  Gastrointestinal: Negative for vomiting, abdominal pain and diarrhea.  Genitourinary: Negative for dysuria, hematuria and flank pain.  Musculoskeletal: Negative for  neck pain.  Skin: Negative for wound.  Neurological: Positive for headaches. Negative for weakness and numbness.  Hematological: Does not bruise/bleed easily.  Psychiatric/Behavioral: Negative for confusion.      Allergies  Iodine; Labetalol hcl; Bactrim; Eszopiclone; Iohexol; Penicillins; Smallpox vaccine; Tetanus toxoids; Topamax; Vancomycin; Aliskiren fumarate; and Codeine  Home Medications   Prior to Admission medications   Medication Sig Start Date End Date Taking? Authorizing Provider  albuterol (PROAIR HFA) 108 (90 BASE) MCG/ACT inhaler Inhale 2 puffs into the lungs every 6 (six) hours as needed for wheezing.    Historical Provider, MD  clonazePAM (KLONOPIN) 1 MG tablet Take 1 mg by mouth 2 (two) times daily.    Historical Provider, MD  cyanocobalamin 1000 MCG tablet Take 1,000 mcg by mouth daily.     Historical Provider, MD  docusate sodium (COLACE) 50 MG capsule Take 100 mg by mouth 2 (two) times daily.     Historical Provider, MD  fluticasone (FLONASE) 50 MCG/ACT nasal spray Place 2 sprays into both nostrils daily.    Historical Provider, MD  furosemide (LASIX) 40 MG tablet Take 10 mg by mouth 2 (two) times a week.     Historical Provider, MD  lisinopril (PRINIVIL,ZESTRIL) 5 MG tablet Take 1.25 mg by mouth as needed (for extremely elevated blood pressures).     Historical Provider, MD  metoprolol (LOPRESSOR) 50 MG tablet Take 50 mg by mouth 2 (two) times daily.     Historical Provider, MD  nystatin (MYCOSTATIN) 100000 UNIT/ML suspension Take 5 mLs by mouth 4 (four) times daily as needed (takes when taking antibiotics).     Historical Provider, MD  Oxycodone HCl 10 MG TABS Take 10 mg by mouth every 6 (six) hours as needed (for pain).    Historical Provider, MD  traZODone (DESYREL) 150 MG tablet Take 150 mg by mouth at bedtime.    Historical Provider, MD  warfarin (COUMADIN) 5 MG tablet Take as directed by coumadin clinic 03/07/15   Duke SalviaSteven C Klein, MD  warfarin (COUMADIN) 7.5 MG  tablet Take 7.5 mg by mouth every Monday, Wednesday, Friday, and Saturday at 6 PM. Take on Mon / Wed / Fri / Sat    Historical Provider, MD   BP 144/80 mmHg  Pulse 59  Temp(Src) 98.2 F (36.8 C) (Oral)  Resp 14  SpO2 91% Physical Exam  Constitutional: She is oriented to person, place, and time. She appears well-developed and well-nourished. No distress.  HENT:  Mild contusion anterior scalp  Eyes: Conjunctivae are normal. Pupils are equal, round, and reactive to light. No scleral icterus.  Neck: Normal range of  motion. Neck supple. No tracheal deviation present.  Cardiovascular: Normal rate, regular rhythm, normal heart sounds and intact distal pulses.   Pulmonary/Chest: Effort normal and breath sounds normal. No respiratory distress. She exhibits no tenderness.  Abdominal: Soft. Normal appearance. She exhibits no distension. There is no tenderness.  Musculoskeletal: She exhibits no edema.  Tenderness right knee anteriorly. No effusion, knee stable. Distal pulses palp bil. Good rom bil ext, no other pain or focal bony tenderness noted. CTLS spine, non tender, aligned, no step off.   Neurological: She is alert and oriented to person, place, and time.  Motor intact bil. stre 5/5. sens grossly intact.   Skin: Skin is warm and dry. No rash noted. She is not diaphoretic.  Psychiatric: She has a normal mood and affect.  Nursing note and vitals reviewed.   ED Course  Procedures (including critical care time) Labs Review  Results for orders placed or performed during the hospital encounter of 03/09/15  CBC  Result Value Ref Range   WBC 7.7 4.0 - 10.5 K/uL   RBC 4.09 3.87 - 5.11 MIL/uL   Hemoglobin 12.9 12.0 - 15.0 g/dL   HCT 40.939.4 81.136.0 - 91.446.0 %   MCV 96.3 78.0 - 100.0 fL   MCH 31.5 26.0 - 34.0 pg   MCHC 32.7 30.0 - 36.0 g/dL   RDW 78.214.0 95.611.5 - 21.315.5 %   Platelets 270 150 - 400 K/uL  Comprehensive metabolic panel  Result Value Ref Range   Sodium 139 135 - 145 mmol/L   Potassium 4.7  3.5 - 5.1 mmol/L   Chloride 102 101 - 111 mmol/L   CO2 27 22 - 32 mmol/L   Glucose, Bld 73 65 - 99 mg/dL   BUN 12 6 - 20 mg/dL   Creatinine, Ser 0.860.84 0.44 - 1.00 mg/dL   Calcium 8.7 (L) 8.9 - 10.3 mg/dL   Total Protein 7.0 6.5 - 8.1 g/dL   Albumin 3.4 (L) 3.5 - 5.0 g/dL   AST 29 15 - 41 U/L   ALT 15 14 - 54 U/L   Alkaline Phosphatase 56 38 - 126 U/L   Total Bilirubin 1.4 (H) 0.3 - 1.2 mg/dL   GFR calc non Af Amer >60 >60 mL/min   GFR calc Af Amer >60 >60 mL/min   Anion gap 10 5 - 15  Protime-INR  Result Value Ref Range   Prothrombin Time 21.2 (H) 11.6 - 15.2 seconds   INR 1.82 (H) 0.00 - 1.49   Ct Head Wo Contrast  03/09/2015   CLINICAL DATA:  Fall last night. Headache and blurry vision. Patient on Coumadin.  EXAM: CT HEAD WITHOUT CONTRAST  TECHNIQUE: Contiguous axial images were obtained from the base of the skull through the vertex without intravenous contrast.  COMPARISON:  Head CT 08/31/2014  FINDINGS: No intracranial hemorrhage. No parenchymal contusion. No midline shift or mass effect. Basilar cisterns are patent. No skull base fracture. No fluid in the paranasal sinuses or mastoid air cells. Orbits are normal.  IMPRESSION: No intracranial trauma.   Electronically Signed   By: Genevive BiStewart  Edmunds M.D.   On: 03/09/2015 20:01   Dg Knee Complete 4 Views Right  03/09/2015   CLINICAL DATA:  Right knee pain status post fall today.  EXAM: RIGHT KNEE - COMPLETE 4+ VIEW  COMPARISON:  None.  FINDINGS: There is no evidence of fracture, dislocation, or joint effusion. There is minimal tibial spine osteophytosis. Soft tissues are unremarkable.  IMPRESSION: There is no acute fracture or  dislocation.   Electronically Signed   By: Sherian Rein M.D.   On: 03/09/2015 19:56       MDM   Iv ns. Morphine iv. zofran iv.  Ice to sore area.  Reviewed nursing notes and prior charts for additional history.   Ct. Xrays.  Pt missed 1 recent dose coumadin, but states has refilled, and has adequate at  home.  Recheck alert, content. Comfortable. Drinking fluids.   Pt currently appears stable for d/c.  Pt has ride home, does not have to drive.  Return precautions provided.     Cathren Laine, MD 03/09/15 2033

## 2015-03-09 NOTE — ED Notes (Signed)
Patient transported to X-ray 

## 2015-03-09 NOTE — ED Notes (Signed)
[facility] fall and right knee pain and swelling. Pt also had a fall last night and hit refrigerator. Stated that she was unsure if she passed out but the only thing she remembers is she woke up at 1230am this morning in the kitchen rubbing her head thinking to herself "why is my head hurting". Pt then today tripped over a cord and has right knee pain and swelling. Pt does take coumadin. Stated that she has had a h/a and waves of nausea since waking up this morning after falling and hitting fridge.

## 2015-03-09 NOTE — Discharge Instructions (Signed)
It was our pleasure to provide your ER care today - we hope that you feel better.  Rest. Ice/coldpack to sore area.  From today's labs, your INR is 1.8 - continue coumadin and follow up with your doctor.  Return to ER if worse, new symptoms, fevers, weak/faint, severe pain, other concern.  You were given pain medication in the ER - no driving for the next 4 hours.        Head Injury You have received a head injury. It does not appear serious at this time. Headaches and vomiting are common following head injury. It should be easy to awaken from sleeping. Sometimes it is necessary for you to stay in the emergency department for a while for observation. Sometimes admission to the hospital may be needed. After injuries such as yours, most problems occur within the first 24 hours, but side effects may occur up to 7-10 days after the injury. It is important for you to carefully monitor your condition and contact your health care provider or seek immediate medical care if there is a change in your condition. WHAT ARE THE TYPES OF HEAD INJURIES? Head injuries can be as minor as a bump. Some head injuries can be more severe. More severe head injuries include:  A jarring injury to the brain (concussion).  A bruise of the brain (contusion). This mean there is bleeding in the brain that can cause swelling.  A cracked skull (skull fracture).  Bleeding in the brain that collects, clots, and forms a bump (hematoma). WHAT CAUSES A HEAD INJURY? A serious head injury is most likely to happen to someone who is in a car wreck and is not wearing a seat belt. Other causes of major head injuries include bicycle or motorcycle accidents, sports injuries, and falls. HOW ARE HEAD INJURIES DIAGNOSED? A complete history of the event leading to the injury and your current symptoms will be helpful in diagnosing head injuries. Many times, pictures of the brain, such as CT or MRI are needed to see the extent of the  injury. Often, an overnight hospital stay is necessary for observation.  WHEN SHOULD I SEEK IMMEDIATE MEDICAL CARE?  You should get help right away if:  You have confusion or drowsiness.  You feel sick to your stomach (nauseous) or have continued, forceful vomiting.  You have dizziness or unsteadiness that is getting worse.  You have severe, continued headaches not relieved by medicine. Only take over-the-counter or prescription medicines for pain, fever, or discomfort as directed by your health care provider.  You do not have normal function of the arms or legs or are unable to walk.  You notice changes in the black spots in the center of the colored part of your eye (pupil).  You have a clear or bloody fluid coming from your nose or ears.  You have a loss of vision. During the next 24 hours after the injury, you must stay with someone who can watch you for the warning signs. This person should contact local emergency services (911 in the [place] if you have seizures, you become unconscious, or you are unable to wake up. HOW CAN I PREVENT A HEAD INJURY IN THE FUTURE? The most important factor for preventing major head injuries is avoiding motor vehicle accidents. To minimize the potential for damage to your head, it is crucial to wear seat belts while riding in motor vehicles. Wearing helmets while bike riding and playing collision sports (like football) is also helpful. Also, avoiding  dangerous activities around the house will further help reduce your risk of head injury.  WHEN CAN I RETURN TO NORMAL ACTIVITIES AND ATHLETICS? You should be reevaluated by your health care provider before returning to these activities. If you have any of the following symptoms, you should not return to activities or contact sports until 1 week after the symptoms have stopped:  Persistent headache.  Dizziness or vertigo.  Poor attention and concentration.  Confusion.  Memory problems.  Nausea or  vomiting.  Fatigue or tire easily.  Irritability.  Intolerant of bright lights or loud noises.  Anxiety or depression.  Disturbed sleep. MAKE SURE YOU:   Understand these instructions.  Will watch your condition.  Will get help right away if you are not doing well or get worse. Document Released: 10/13/2005 Document Revised: 10/18/2013 Document Reviewed: 06/20/2013 [facility] Patient Information 2015 [place], [place]. This information is not intended to replace advice given to you by your health care provider. Make sure you discuss any questions you have with your health care provider.      Contusion A contusion is the result of an injury to the skin and underlying tissues and is usually caused by direct trauma. The injury results in the appearance of a bruise on the skin overlying the injured tissues. Contusions cause rupture and bleeding of the small capillaries and blood vessels and affect function, because the bleeding infiltrates muscles, tendons, nerves, or other soft tissues.  SYMPTOMS   Swelling and often a hard lump in the injured area, either superficial or deep.  Pain and tenderness over the area of the contusion.  Feeling of firmness when pressure is exerted over the contusion.  Discoloration under the skin, beginning with redness and progressing to the characteristic "black and blue" bruise. CAUSES  A contusion is typically the result of direct trauma. This is often by a blunt object.  RISK INCREASES WITH:  Sports that have a high likelihood of trauma (football, boxing, ice hockey, soccer, field hockey, martial arts, basketball, and baseball).  Sports that make falling from a height likely (high-jumping, pole-vaulting, skating, or gymnastics).  Any bleeding disorder (hemophilia) or taking medications that affect clotting (aspirin, nonsteroidal anti-inflammatory medications, or warfarin [Coumadin]).  Inadequate protection of exposed areas during contact  sports. PREVENTION  Maintain physical fitness:  Joint and muscle flexibility.  Strength and endurance.  Coordination.  Wear proper protective equipment. Make sure it fits correctly. PROGNOSIS  Contusions typically heal without any complications. Healing time varies with the severity of injury and intake of medications that affect clotting. Contusions usually heal in 1 to 4 weeks. RELATED COMPLICATIONS   Damage to nearby nerves or blood vessels, causing numbness, coldness, or paleness.  Compartment syndrome.  Bleeding into the soft tissues that leads to disability.  Infiltrative-type bleeding, leading to the calcification and impaired function of the injured muscle (rare).  Prolonged healing time if usual activities are resumed too soon.  Infection if the skin over the injury site is broken.  Fracture of the bone underlying the contusion.  Stiffness in the joint where the injured muscle crosses. TREATMENT  Treatment initially consists of resting the injured area as well as medication and ice to reduce inflammation. The use of a compression bandage may also be helpful in minimizing inflammation. As pain diminishes and movement is tolerated, the joint where the affected muscle crosses should be moved to prevent stiffness and the shortening (contracture) of the joint. Movement of the joint should begin as soon as possible. It  is also important to work on maintaining strength within the affected muscles. Occasionally, extra padding over the area of contusion may be recommended before returning to sports, particularly if re-injury is likely.  MEDICATION   If pain relief is necessary these medications are often recommended:  Nonsteroidal anti-inflammatory medications, such as aspirin and ibuprofen.  Other minor pain relievers, such as acetaminophen, are often recommended.  Prescription pain relievers may be given by your caregiver. Use only as directed and only as much as you  need. HEAT AND COLD  Cold treatment (icing) relieves pain and reduces inflammation. Cold treatment should be applied for 10 to 15 minutes every 2 to 3 hours for inflammation and pain and immediately after any activity that aggravates your symptoms. Use ice packs or an ice massage. (To do an ice massage fill a large styrofoam cup with water and freeze. Tear a small amount of foam from the top so ice protrudes. Massage ice firmly over the injured area in a circle about the size of a softball.)  Heat treatment may be used prior to performing the stretching and strengthening activities prescribed by your caregiver, physical therapist, or athletic trainer. Use a heat pack or a warm soak. SEEK MEDICAL CARE IF:   Symptoms get worse or do not improve despite treatment in a few days.  You have difficulty moving a joint.  Any extremity becomes extremely painful, numb, pale, or cool (This is an emergency!).  Medication produces any side effects (bleeding, upset stomach, or allergic reaction).  Signs of infection (drainage from skin, headache, muscle aches, dizziness, fever, or general ill feeling) occur if skin was broken. Document Released: 10/13/2005 Document Revised: 01/05/2012 Document Reviewed: 01/25/2009 [facility] Patient Information 2015 [place], [place]. This information is not intended to replace advice given to you by your health care provider. Make sure you discuss any questions you have with your health care provider.     Cryotherapy Cryotherapy means treatment with cold. Ice or gel packs can be used to reduce both pain and swelling. Ice is the most helpful within the first 24 to 48 hours after an injury or flare-up from overusing a muscle or joint. Sprains, strains, spasms, burning pain, shooting pain, and aches can all be eased with ice. Ice can also be used when recovering from surgery. Ice is effective, has very few side effects, and is safe for most people to use. PRECAUTIONS  Ice is  not a safe treatment option for people with:  Raynaud phenomenon. This is a condition affecting small blood vessels in the extremities. Exposure to cold may cause your problems to return.  Cold hypersensitivity. There are many forms of cold hypersensitivity, including:  Cold urticaria. Red, itchy hives appear on the skin when the tissues begin to warm after being iced.  Cold erythema. This is a red, itchy rash caused by exposure to cold.  Cold hemoglobinuria. Red blood cells break down when the tissues begin to warm after being iced. The hemoglobin that carry oxygen are passed into the urine because they cannot combine with blood proteins fast enough.  Numbness or altered sensitivity in the area being iced. If you have any of the following conditions, do not use ice until you have discussed cryotherapy with your caregiver:  Heart conditions, such as arrhythmia, angina, or chronic heart disease.  High blood pressure.  Healing wounds or open skin in the area being iced.  Current infections.  Rheumatoid arthritis.  Poor circulation.  Diabetes. Ice slows the blood flow in  the region it is applied. This is beneficial when trying to stop inflamed tissues from spreading irritating chemicals to surrounding tissues. However, if you expose your skin to cold temperatures for too long or without the proper protection, you can damage your skin or nerves. Watch for signs of skin damage due to cold. HOME CARE INSTRUCTIONS Follow these tips to use ice and cold packs safely.  Place a dry or damp towel between the ice and skin. A damp towel will cool the skin more quickly, so you may need to shorten the time that the ice is used.  For a more rapid response, add gentle compression to the ice.  Ice for no more than 10 to 20 minutes at a time. The bonier the area you are icing, the less time it will take to get the benefits of ice.  Check your skin after 5 minutes to make sure there are no signs of  a poor response to cold or skin damage.  Rest 20 minutes or more between uses.  Once your skin is numb, you can end your treatment. You can test numbness by very lightly touching your skin. The touch should be so light that you do not see the skin dimple from the pressure of your fingertip. When using ice, most people will feel these normal sensations in this order: cold, burning, aching, and numbness.  Do not use ice on someone who cannot communicate their responses to pain, such as small children or people with dementia. HOW TO MAKE AN ICE PACK Ice packs are the most common way to use ice therapy. Other methods include ice massage, ice baths, and cryosprays. Muscle creams that cause a cold, tingly feeling do not offer the same benefits that ice offers and should not be used as a substitute unless recommended by your caregiver. To make an ice pack, do one of the following:  Place crushed ice or a bag of frozen vegetables in a sealable plastic bag. Squeeze out the excess air. Place this bag inside another plastic bag. Slide the bag into a pillowcase or place a damp towel between your skin and the bag.  Mix 3 parts water with 1 part rubbing alcohol. Freeze the mixture in a sealable plastic bag. When you remove the mixture from the freezer, it will be slushy. Squeeze out the excess air. Place this bag inside another plastic bag. Slide the bag into a pillowcase or place a damp towel between your skin and the bag. SEEK MEDICAL CARE IF:  You develop white spots on your skin. This may give the skin a blotchy (mottled) appearance.  Your skin turns blue or pale.  Your skin becomes waxy or hard.  Your swelling gets worse. MAKE SURE YOU:   Understand these instructions.  Will watch your condition.  Will get help right away if you are not doing well or get worse. Document Released: 06/09/2011 Document Revised: 02/27/2014 Document Reviewed: 06/09/2011 [facility] Patient Information 2015 [place]. This information is not intended to replace advice given to you by your health care provider. Make sure you discuss any questions you have with your health care provider.

## 2015-03-19 ENCOUNTER — Telehealth: Payer: Self-pay | Admitting: Cardiovascular Disease

## 2015-03-19 ENCOUNTER — Emergency Department (HOSPITAL_COMMUNITY): Payer: Medicaid Other

## 2015-03-19 ENCOUNTER — Telehealth: Payer: Self-pay | Admitting: Internal Medicine

## 2015-03-19 ENCOUNTER — Encounter (HOSPITAL_COMMUNITY): Payer: Self-pay | Admitting: Radiology

## 2015-03-19 ENCOUNTER — Ambulatory Visit (INDEPENDENT_AMBULATORY_CARE_PROVIDER_SITE_OTHER): Payer: Medicaid Other | Admitting: *Deleted

## 2015-03-19 ENCOUNTER — Inpatient Hospital Stay (HOSPITAL_COMMUNITY)
Admission: EM | Admit: 2015-03-19 | Discharge: 2015-03-27 | DRG: 493 | Disposition: A | Discharge status: Skilled Nursing Facility | Payer: Medicaid Other | Attending: Orthopedic Surgery | Admitting: Orthopedic Surgery

## 2015-03-19 DIAGNOSIS — Z9884 Bariatric surgery status: Secondary | ICD-10-CM | POA: Diagnosis not present

## 2015-03-19 DIAGNOSIS — B370 Candidal stomatitis: Secondary | ICD-10-CM | POA: Diagnosis not present

## 2015-03-19 DIAGNOSIS — Z885 Allergy status to narcotic agent status: Secondary | ICD-10-CM | POA: Diagnosis not present

## 2015-03-19 DIAGNOSIS — Z888 Allergy status to other drugs, medicaments and biological substances status: Secondary | ICD-10-CM | POA: Diagnosis not present

## 2015-03-19 DIAGNOSIS — Z9081 Acquired absence of spleen: Secondary | ICD-10-CM | POA: Diagnosis present

## 2015-03-19 DIAGNOSIS — S2221XA Fracture of manubrium, initial encounter for closed fracture: Secondary | ICD-10-CM | POA: Diagnosis present

## 2015-03-19 DIAGNOSIS — Z79899 Other long term (current) drug therapy: Secondary | ICD-10-CM

## 2015-03-19 DIAGNOSIS — S62303A Unspecified fracture of third metacarpal bone, left hand, initial encounter for closed fracture: Secondary | ICD-10-CM | POA: Diagnosis present

## 2015-03-19 DIAGNOSIS — Z91041 Radiographic dye allergy status: Secondary | ICD-10-CM

## 2015-03-19 DIAGNOSIS — G8929 Other chronic pain: Secondary | ICD-10-CM | POA: Diagnosis present

## 2015-03-19 DIAGNOSIS — Z86711 Personal history of pulmonary embolism: Secondary | ICD-10-CM | POA: Diagnosis not present

## 2015-03-19 DIAGNOSIS — S62619A Displaced fracture of proximal phalanx of unspecified finger, initial encounter for closed fracture: Secondary | ICD-10-CM | POA: Diagnosis present

## 2015-03-19 DIAGNOSIS — S2241XA Multiple fractures of ribs, right side, initial encounter for closed fracture: Secondary | ICD-10-CM | POA: Diagnosis present

## 2015-03-19 DIAGNOSIS — T148XXA Other injury of unspecified body region, initial encounter: Secondary | ICD-10-CM

## 2015-03-19 DIAGNOSIS — I5032 Chronic diastolic (congestive) heart failure: Secondary | ICD-10-CM | POA: Diagnosis present

## 2015-03-19 DIAGNOSIS — S81812A Laceration without foreign body, left lower leg, initial encounter: Secondary | ICD-10-CM | POA: Diagnosis present

## 2015-03-19 DIAGNOSIS — F419 Anxiety disorder, unspecified: Secondary | ICD-10-CM | POA: Diagnosis present

## 2015-03-19 DIAGNOSIS — G4733 Obstructive sleep apnea (adult) (pediatric): Secondary | ICD-10-CM | POA: Diagnosis present

## 2015-03-19 DIAGNOSIS — I10 Essential (primary) hypertension: Secondary | ICD-10-CM | POA: Diagnosis present

## 2015-03-19 DIAGNOSIS — Z86718 Personal history of other venous thrombosis and embolism: Secondary | ICD-10-CM | POA: Diagnosis not present

## 2015-03-19 DIAGNOSIS — S82141A Displaced bicondylar fracture of right tibia, initial encounter for closed fracture: Secondary | ICD-10-CM | POA: Diagnosis present

## 2015-03-19 DIAGNOSIS — S301XXA Contusion of abdominal wall, initial encounter: Secondary | ICD-10-CM | POA: Diagnosis present

## 2015-03-19 DIAGNOSIS — Z5181 Encounter for therapeutic drug level monitoring: Secondary | ICD-10-CM

## 2015-03-19 DIAGNOSIS — K5900 Constipation, unspecified: Secondary | ICD-10-CM | POA: Diagnosis not present

## 2015-03-19 DIAGNOSIS — S82892A Other fracture of left lower leg, initial encounter for closed fracture: Secondary | ICD-10-CM | POA: Diagnosis present

## 2015-03-19 DIAGNOSIS — Z7901 Long term (current) use of anticoagulants: Secondary | ICD-10-CM

## 2015-03-19 DIAGNOSIS — D62 Acute posthemorrhagic anemia: Secondary | ICD-10-CM | POA: Diagnosis not present

## 2015-03-19 DIAGNOSIS — Z881 Allergy status to other antibiotic agents status: Secondary | ICD-10-CM | POA: Diagnosis not present

## 2015-03-19 DIAGNOSIS — S82842A Displaced bimalleolar fracture of left lower leg, initial encounter for closed fracture: Secondary | ICD-10-CM | POA: Diagnosis not present

## 2015-03-19 DIAGNOSIS — Z880 Allergy status to penicillin: Secondary | ICD-10-CM

## 2015-03-19 DIAGNOSIS — S62305A Unspecified fracture of fourth metacarpal bone, left hand, initial encounter for closed fracture: Secondary | ICD-10-CM | POA: Diagnosis present

## 2015-03-19 DIAGNOSIS — F329 Major depressive disorder, single episode, unspecified: Secondary | ICD-10-CM | POA: Diagnosis present

## 2015-03-19 DIAGNOSIS — Z9181 History of falling: Secondary | ICD-10-CM

## 2015-03-19 DIAGNOSIS — Z6835 Body mass index (BMI) 35.0-35.9, adult: Secondary | ICD-10-CM | POA: Diagnosis not present

## 2015-03-19 DIAGNOSIS — Z79891 Long term (current) use of opiate analgesic: Secondary | ICD-10-CM | POA: Diagnosis not present

## 2015-03-19 DIAGNOSIS — T148 Other injury of unspecified body region: Secondary | ICD-10-CM | POA: Diagnosis not present

## 2015-03-19 DIAGNOSIS — R0602 Shortness of breath: Secondary | ICD-10-CM

## 2015-03-19 DIAGNOSIS — T1490XA Injury, unspecified, initial encounter: Secondary | ICD-10-CM

## 2015-03-19 DIAGNOSIS — D6859 Other primary thrombophilia: Secondary | ICD-10-CM | POA: Diagnosis not present

## 2015-03-19 DIAGNOSIS — S82201A Unspecified fracture of shaft of right tibia, initial encounter for closed fracture: Secondary | ICD-10-CM | POA: Diagnosis present

## 2015-03-19 DIAGNOSIS — W19XXXA Unspecified fall, initial encounter: Secondary | ICD-10-CM

## 2015-03-19 DIAGNOSIS — Z950 Presence of cardiac pacemaker: Secondary | ICD-10-CM | POA: Diagnosis not present

## 2015-03-19 DIAGNOSIS — Z887 Allergy status to serum and vaccine status: Secondary | ICD-10-CM | POA: Diagnosis not present

## 2015-03-19 DIAGNOSIS — E669 Obesity, unspecified: Secondary | ICD-10-CM | POA: Diagnosis present

## 2015-03-19 DIAGNOSIS — I313 Pericardial effusion (noninflammatory): Secondary | ICD-10-CM | POA: Diagnosis not present

## 2015-03-19 DIAGNOSIS — S82899A Other fracture of unspecified lower leg, initial encounter for closed fracture: Secondary | ICD-10-CM

## 2015-03-19 DIAGNOSIS — S2220XA Unspecified fracture of sternum, initial encounter for closed fracture: Secondary | ICD-10-CM | POA: Diagnosis present

## 2015-03-19 DIAGNOSIS — I2699 Other pulmonary embolism without acute cor pulmonale: Secondary | ICD-10-CM | POA: Diagnosis not present

## 2015-03-19 DIAGNOSIS — Z01810 Encounter for preprocedural cardiovascular examination: Secondary | ICD-10-CM | POA: Diagnosis not present

## 2015-03-19 LAB — COMPREHENSIVE METABOLIC PANEL
ALT: 29 U/L (ref 14–54)
ANION GAP: 11 (ref 5–15)
AST: 57 U/L — ABNORMAL HIGH (ref 15–41)
Albumin: 3.2 g/dL — ABNORMAL LOW (ref 3.5–5.0)
Alkaline Phosphatase: 55 U/L (ref 38–126)
BUN: 14 mg/dL (ref 6–20)
CHLORIDE: 103 mmol/L (ref 101–111)
CO2: 24 mmol/L (ref 22–32)
CREATININE: 1.13 mg/dL — AB (ref 0.44–1.00)
Calcium: 8.2 mg/dL — ABNORMAL LOW (ref 8.9–10.3)
GFR calc Af Amer: >60 mL/min (ref >60)
GFR, EST NON AFRICAN AMERICAN: 52 mL/min — AB (ref >60)
Glucose, Bld: 252 mg/dL — ABNORMAL HIGH (ref 65–99)
Potassium: 4.2 mmol/L (ref 3.5–5.1)
Sodium: 138 mmol/L (ref 135–145)
Total Bilirubin: 1.3 mg/dL — ABNORMAL HIGH (ref 0.3–1.2)
Total Protein: 6.2 g/dL — ABNORMAL LOW (ref 6.5–8.1)

## 2015-03-19 LAB — POCT INR: INR: 3

## 2015-03-19 LAB — CBC
HCT: 40.2 % (ref 36.0–46.0)
Hemoglobin: 13.3 g/dL (ref 12.0–15.0)
MCH: 32 pg (ref 26.0–34.0)
MCHC: 33.1 g/dL (ref 30.0–36.0)
MCV: 96.9 fL (ref 78.0–100.0)
Platelets: 251 10*3/uL (ref 150–400)
RBC: 4.15 MIL/uL (ref 3.87–5.11)
RDW: 14.5 % (ref 11.5–15.5)
WBC: 12.8 10*3/uL — AB (ref 4.0–10.5)

## 2015-03-19 LAB — SAMPLE TO BLOOD BANK

## 2015-03-19 LAB — PROTIME-INR
INR: 2.82 — ABNORMAL HIGH (ref 0.00–1.49)
Prothrombin Time: 29.2 seconds — ABNORMAL HIGH (ref 11.6–15.2)

## 2015-03-19 LAB — CDS SEROLOGY

## 2015-03-19 LAB — ETHANOL: Alcohol, Ethyl (B): <5 mg/dL (ref <5)

## 2015-03-19 MED ORDER — FENTANYL CITRATE (PF) 100 MCG/2ML IJ SOLN
100.0000 ug | Freq: Once | INTRAMUSCULAR | Status: DC
  Filled 2015-03-19: qty 2

## 2015-03-19 MED ORDER — FENTANYL CITRATE (PF) 100 MCG/2ML IJ SOLN
INTRAMUSCULAR | Status: DC | PRN
  Administered 2015-03-19 (×2): 50 ug via INTRAVENOUS

## 2015-03-19 MED ORDER — DIPHENHYDRAMINE HCL 50 MG/ML IJ SOLN
50.0000 mg | Freq: Once | INTRAMUSCULAR | Status: AC
  Administered 2015-03-19: 50 mg via INTRAVENOUS
  Filled 2015-03-19: qty 1

## 2015-03-19 MED ORDER — KETAMINE HCL 10 MG/ML IJ SOLN
15.0000 mg | Freq: Once | INTRAMUSCULAR | Status: AC
  Administered 2015-03-19: 15 mg via INTRAVENOUS
  Filled 2015-03-19 (×3): qty 1.5

## 2015-03-19 MED ORDER — FENTANYL CITRATE (PF) 100 MCG/2ML IJ SOLN
INTRAMUSCULAR | Status: AC
  Filled 2015-03-19: qty 2

## 2015-03-19 MED ORDER — FENTANYL CITRATE (PF) 100 MCG/2ML IJ SOLN
50.0000 ug | INTRAMUSCULAR | Status: DC | PRN
  Administered 2015-03-19: 50 ug via INTRAVENOUS
  Filled 2015-03-19: qty 2

## 2015-03-19 MED ORDER — FENTANYL CITRATE (PF) 100 MCG/2ML IJ SOLN
INTRAMUSCULAR | Status: AC
  Administered 2015-03-19: 50 ug via INTRAVENOUS
  Filled 2015-03-19: qty 2

## 2015-03-19 MED ORDER — LIDOCAINE HCL (PF) 1 % IJ SOLN
5.0000 mL | Freq: Once | INTRAMUSCULAR | Status: AC
  Administered 2015-03-19: 5 mL
  Filled 2015-03-19: qty 5

## 2015-03-19 MED ORDER — CEFAZOLIN SODIUM 1-5 GM-% IV SOLN
1.0000 g | Freq: Once | INTRAVENOUS | Status: AC
  Administered 2015-03-19: 1 g via INTRAVENOUS
  Filled 2015-03-19: qty 50

## 2015-03-19 NOTE — ED Notes (Signed)
EDP at bedside with suture cart to suture left lower extremity lac.

## 2015-03-19 NOTE — H&P (Signed)
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 Pulling, MD, FACS General, Bariatric, & Minimally Invasive Surgery [facility], [place]    [facility] M 03/19/2015, 10:08 PM   Procedures
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
## 2015-03-19 NOTE — ED Notes (Signed)
Family at bedside. 
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
## 2015-03-19 NOTE — ED Notes (Signed)
Placed patient in a gown clothing cut off, manual blood pressure done, placed on the heart monitor, pulse oxy and blood pressure cuff, warm blanket given

## 2015-03-19 NOTE — ED Notes (Signed)
Pt transported to CT ?

## 2015-03-19 NOTE — Telephone Encounter (Signed)
Patient fell 10 days ago and now has Black Bruising that is spreading. Patient was reading mail in kitchen and fell asleep. She hit her head and hurt her knee as well. [facility] to [facility] and was sent home. Patient has fu with pcp June 1st but would like coumadin checked. Patient says bruising is black and is spreading from origin on R Knee to thigh and down R leg. Patient also has black bruising on foot and ankle. Describes some sponginess on thigh and swelling in Foot so much so that mens sock causes creases when removed.   Please call patient and advise.
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
## 2015-03-19 NOTE — Telephone Encounter (Signed)
Please see previous phone note.  

## 2015-03-19 NOTE — Telephone Encounter (Signed)
Patient fell 10 days ago and now has Black Bruising that is spreading.  Patient was reading mail in kitchen and fell asleep.  She hit her head and hurt her knee as well.  [facility] went to [facility] and was sent home.  Patient has fu with pcp June 1st but would like coumadin checked.  Patient says bruising is black and is spreading from origin on R Knee to thigh and down R leg.  Patient also has black bruising on foot and ankle.  Describes some sponginess on thigh and swelling in Foot so much so that mens sock causes creases when removed.   ° °Please call patient and advise.    °

## 2015-03-19 NOTE — Progress Notes (Signed)
Full consult to follow:   Pt with small finger prox phalanx fracture, metacarpal fracture that will need operative fixation.  Will try to arrange when pt has other surgery.  Please notify me when this is arranged.

## 2015-03-19 NOTE — ED Notes (Signed)
Paged Ortho Tech to reapply left ankle splint

## 2015-03-19 NOTE — Progress Notes (Signed)
Orthopedic Tech Progress Note Patient Details:  Susan Howell 04/09/1956 [id number]  Ortho Devices Type of Ortho Device: Ace wrap, Post (short leg) splint, Stirrup splint Ortho Device/Splint Location: LLE Ortho Device/Splint Interventions: Ordered, Application   Jennye MoccasinHughes, [name] 03/19/2015, 7:18 PM

## 2015-03-19 NOTE — ED Notes (Signed)
Pt placed into room and on monitor.Pt remains monitored by blood pressure, pulse ox, and 5 lead. pts family remains at bedside.

## 2015-03-20 ENCOUNTER — Inpatient Hospital Stay (HOSPITAL_COMMUNITY): Payer: Medicaid Other

## 2015-03-20 DIAGNOSIS — Z01810 Encounter for preprocedural cardiovascular examination: Secondary | ICD-10-CM

## 2015-03-20 DIAGNOSIS — I313 Pericardial effusion (noninflammatory): Secondary | ICD-10-CM

## 2015-03-20 DIAGNOSIS — D6859 Other primary thrombophilia: Secondary | ICD-10-CM

## 2015-03-20 LAB — COMPREHENSIVE METABOLIC PANEL
ALT: 26 U/L (ref 14–54)
ANION GAP: 6 (ref 5–15)
AST: 42 U/L — ABNORMAL HIGH (ref 15–41)
Albumin: 2.7 g/dL — ABNORMAL LOW (ref 3.5–5.0)
Alkaline Phosphatase: 44 U/L (ref 38–126)
BUN: 12 mg/dL (ref 6–20)
CO2: 24 mmol/L (ref 22–32)
CREATININE: 0.86 mg/dL (ref 0.44–1.00)
Calcium: 7.9 mg/dL — ABNORMAL LOW (ref 8.9–10.3)
Chloride: 107 mmol/L (ref 101–111)
GFR calc non Af Amer: >60 mL/min (ref >60)
Glucose, Bld: 161 mg/dL — ABNORMAL HIGH (ref 65–99)
POTASSIUM: 3.8 mmol/L (ref 3.5–5.1)
SODIUM: 137 mmol/L (ref 135–145)
Total Bilirubin: 1 mg/dL (ref 0.3–1.2)
Total Protein: 5.5 g/dL — ABNORMAL LOW (ref 6.5–8.1)

## 2015-03-20 LAB — CBC
HCT: 35.1 % — ABNORMAL LOW (ref 36.0–46.0)
Hemoglobin: 11.4 g/dL — ABNORMAL LOW (ref 12.0–15.0)
MCH: 31.5 pg (ref 26.0–34.0)
MCHC: 32.5 g/dL (ref 30.0–36.0)
MCV: 97 fL (ref 78.0–100.0)
Platelets: 262 10*3/uL (ref 150–400)
RBC: 3.62 MIL/uL — AB (ref 3.87–5.11)
RDW: 14.5 % (ref 11.5–15.5)
WBC: 9.1 10*3/uL (ref 4.0–10.5)

## 2015-03-20 LAB — MRSA PCR SCREENING: MRSA by PCR: NEGATIVE

## 2015-03-20 LAB — PROTIME-INR
INR: 3 — ABNORMAL HIGH (ref 0.00–1.49)
INR: 1.94 — ABNORMAL HIGH (ref 0.00–1.49)
PROTHROMBIN TIME: 30.6 s — AB (ref 11.6–15.2)
Prothrombin Time: 22 s — ABNORMAL HIGH (ref 11.6–15.2)

## 2015-03-20 LAB — APTT: aPTT: 33 seconds (ref 24–37)

## 2015-03-20 MED ORDER — PRO-STAT SUGAR FREE PO LIQD
30.0000 mL | Freq: Two times a day (BID) | ORAL | Status: DC
  Administered 2015-03-20 – 2015-03-27 (×11): 30 mL via ORAL
  Filled 2015-03-20 (×16): qty 30

## 2015-03-20 MED ORDER — ENSURE ENLIVE PO LIQD
237.0000 mL | Freq: Two times a day (BID) | ORAL | Status: DC
  Administered 2015-03-20 – 2015-03-27 (×11): 237 mL via ORAL

## 2015-03-20 MED ORDER — HEPARIN (PORCINE) IN NACL 100-0.45 UNIT/ML-% IJ SOLN
1050.0000 [IU]/h | INTRAMUSCULAR | Status: DC
  Administered 2015-03-20: 1250 [IU]/h via INTRAVENOUS
  Administered 2015-03-21: 1300 [IU]/h via INTRAVENOUS
  Filled 2015-03-20 (×3): qty 250

## 2015-03-20 MED ORDER — MORPHINE SULFATE ER 15 MG PO TBCR
15.0000 mg | EXTENDED_RELEASE_TABLET | Freq: Two times a day (BID) | ORAL | Status: DC
  Administered 2015-03-20 – 2015-03-27 (×15): 15 mg via ORAL
  Filled 2015-03-20 (×15): qty 1

## 2015-03-20 MED ORDER — METOPROLOL TARTRATE 50 MG PO TABS
50.0000 mg | ORAL_TABLET | Freq: Two times a day (BID) | ORAL | Status: DC
  Administered 2015-03-21 – 2015-03-27 (×11): 50 mg via ORAL
  Filled 2015-03-20 (×18): qty 1

## 2015-03-20 MED ORDER — METHOCARBAMOL 1000 MG/10ML IJ SOLN
1000.0000 mg | Freq: Three times a day (TID) | INTRAVENOUS | Status: AC
  Administered 2015-03-20 – 2015-03-21 (×6): 1000 mg via INTRAVENOUS
  Filled 2015-03-20 (×7): qty 10

## 2015-03-20 MED ORDER — OXYCODONE HCL 5 MG PO TABS
5.0000 mg | ORAL_TABLET | ORAL | Status: DC | PRN
  Administered 2015-03-20: 15 mg via ORAL
  Administered 2015-03-21: 10 mg via ORAL
  Administered 2015-03-22: 15 mg via ORAL
  Administered 2015-03-22 (×2): 5 mg via ORAL
  Administered 2015-03-23 – 2015-03-27 (×23): 15 mg via ORAL
  Filled 2015-03-20 (×17): qty 3
  Filled 2015-03-20: qty 1
  Filled 2015-03-20 (×6): qty 3
  Filled 2015-03-20: qty 1
  Filled 2015-03-20 (×2): qty 3
  Filled 2015-03-20: qty 1
  Filled 2015-03-20: qty 3

## 2015-03-20 MED ORDER — POTASSIUM CHLORIDE IN NACL 20-0.9 MEQ/L-% IV SOLN
INTRAVENOUS | Status: DC
  Administered 2015-03-20: 02:00:00 via INTRAVENOUS
  Filled 2015-03-20 (×4): qty 1000

## 2015-03-20 MED ORDER — ONDANSETRON HCL 4 MG PO TABS
4.0000 mg | ORAL_TABLET | Freq: Four times a day (QID) | ORAL | Status: DC | PRN
  Filled 2015-03-20: qty 1

## 2015-03-20 MED ORDER — HYDROMORPHONE HCL 1 MG/ML IJ SOLN
1.0000 mg | INTRAMUSCULAR | Status: DC | PRN
  Administered 2015-03-20 – 2015-03-21 (×8): 2 mg via INTRAVENOUS
  Administered 2015-03-22: 1 mg via INTRAVENOUS
  Administered 2015-03-22 – 2015-03-23 (×3): 2 mg via INTRAVENOUS
  Administered 2015-03-23: 1 mg via INTRAVENOUS
  Administered 2015-03-23: 2 mg via INTRAVENOUS
  Administered 2015-03-23 (×2): 1 mg via INTRAVENOUS
  Administered 2015-03-23: 2 mg via INTRAVENOUS
  Administered 2015-03-24 (×5): 1 mg via INTRAVENOUS
  Administered 2015-03-25 – 2015-03-26 (×8): 2 mg via INTRAVENOUS
  Administered 2015-03-26: 1 mg via INTRAVENOUS
  Administered 2015-03-26 – 2015-03-27 (×7): 2 mg via INTRAVENOUS
  Filled 2015-03-20 (×12): qty 2
  Filled 2015-03-20: qty 1
  Filled 2015-03-20 (×2): qty 2
  Filled 2015-03-20: qty 1
  Filled 2015-03-20 (×3): qty 2
  Filled 2015-03-20: qty 1
  Filled 2015-03-20 (×2): qty 2
  Filled 2015-03-20 (×2): qty 1
  Filled 2015-03-20 (×4): qty 2
  Filled 2015-03-20 (×2): qty 1
  Filled 2015-03-20 (×3): qty 2
  Filled 2015-03-20: qty 1
  Filled 2015-03-20 (×4): qty 2
  Filled 2015-03-20 (×2): qty 1

## 2015-03-20 MED ORDER — HYDROMORPHONE HCL 1 MG/ML IJ SOLN
1.0000 mg | INTRAMUSCULAR | Status: DC | PRN
  Administered 2015-03-20 (×3): 3 mg via INTRAVENOUS
  Administered 2015-03-20: 2 mg via INTRAVENOUS
  Filled 2015-03-20: qty 3
  Filled 2015-03-20: qty 2
  Filled 2015-03-20 (×2): qty 3

## 2015-03-20 MED ORDER — VITAMIN K1 10 MG/ML IJ SOLN
10.0000 mg | Freq: Once | INTRAMUSCULAR | Status: AC
  Administered 2015-03-20: 10 mg via INTRAVENOUS
  Filled 2015-03-20: qty 1

## 2015-03-20 MED ORDER — CETYLPYRIDINIUM CHLORIDE 0.05 % MT LIQD
7.0000 mL | Freq: Two times a day (BID) | OROMUCOSAL | Status: DC
  Administered 2015-03-20 – 2015-03-26 (×12): 7 mL via OROMUCOSAL

## 2015-03-20 MED ORDER — ONDANSETRON HCL 4 MG/2ML IJ SOLN
4.0000 mg | Freq: Four times a day (QID) | INTRAMUSCULAR | Status: DC | PRN
Start: 2015-03-20 — End: 2015-03-27
  Administered 2015-03-24 – 2015-03-25 (×4): 4 mg via INTRAVENOUS
  Filled 2015-03-20 (×4): qty 2

## 2015-03-20 MED ORDER — PANTOPRAZOLE SODIUM 40 MG PO TBEC
40.0000 mg | DELAYED_RELEASE_TABLET | Freq: Every day | ORAL | Status: DC
  Administered 2015-03-20 – 2015-03-27 (×7): 40 mg via ORAL
  Filled 2015-03-20 (×7): qty 1

## 2015-03-20 MED ORDER — PANTOPRAZOLE SODIUM 40 MG IV SOLR
40.0000 mg | Freq: Every day | INTRAVENOUS | Status: DC
  Administered 2015-03-21: 40 mg via INTRAVENOUS
  Filled 2015-03-20 (×5): qty 40
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
## 2015-03-20 NOTE — Progress Notes (Signed)
Orthopedic Tech Progress Note Patient Details:  Susan Howell 05/02/1956 [id number]  Ortho Devices Type of Ortho Device: Knee Immobilizer Ortho Device/Splint Location: rle Ortho Device/Splint Interventions: Application   Susan Howell 03/20/2015, 8:54 AM

## 2015-03-20 NOTE — Progress Notes (Signed)
UR completed.  Await medical stability for therapy evaluations to determine next level of care needed.    Quintella BatonJulie W. Angelamarie Avakian, RN, BSN  Trauma/Neuro ICU Case Manager [phone]
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
## 2015-03-20 NOTE — Progress Notes (Signed)
Echocardiogram 2D Echocardiogram has been performed.  Dorothey BasemanReel, Kayliana Codd M 03/20/2015, 3:44 PM
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
## 2015-03-20 NOTE — Progress Notes (Signed)
Immobilizer applied to right leg. Patient tolerated well.

## 2015-03-21 DIAGNOSIS — I2699 Other pulmonary embolism without acute cor pulmonale: Secondary | ICD-10-CM

## 2015-03-21 LAB — BASIC METABOLIC PANEL
Anion gap: 8 (ref 5–15)
BUN: 10 mg/dL (ref 6–20)
CO2: 23 mmol/L (ref 22–32)
Calcium: 8.1 mg/dL — ABNORMAL LOW (ref 8.9–10.3)
Chloride: 106 mmol/L (ref 101–111)
Creatinine, Ser: 0.71 mg/dL (ref 0.44–1.00)
Glucose, Bld: 108 mg/dL — ABNORMAL HIGH (ref 65–99)
Potassium: 4.3 mmol/L (ref 3.5–5.1)
Sodium: 137 mmol/L (ref 135–145)

## 2015-03-21 LAB — CBC
HEMATOCRIT: 31.6 % — AB (ref 36.0–46.0)
HEMOGLOBIN: 10.2 g/dL — AB (ref 12.0–15.0)
MCH: 31.5 pg (ref 26.0–34.0)
MCHC: 32.3 g/dL (ref 30.0–36.0)
MCV: 97.5 fL (ref 78.0–100.0)
PLATELETS: 205 10*3/uL (ref 150–400)
RBC: 3.24 MIL/uL — ABNORMAL LOW (ref 3.87–5.11)
RDW: 14.5 % (ref 11.5–15.5)
WBC: 7.9 10*3/uL (ref 4.0–10.5)

## 2015-03-21 LAB — HEPARIN LEVEL (UNFRACTIONATED)
HEPARIN UNFRACTIONATED: 0.23 [IU]/mL — AB (ref 0.30–0.70)
Heparin Unfractionated: 0.75 [IU]/mL — ABNORMAL HIGH (ref 0.30–0.70)
Heparin Unfractionated: 0.88 [IU]/mL — ABNORMAL HIGH (ref 0.30–0.70)

## 2015-03-21 LAB — PROTIME-INR
INR: 1.52 — ABNORMAL HIGH (ref 0.00–1.49)
Prothrombin Time: 18.4 seconds — ABNORMAL HIGH (ref 11.6–15.2)

## 2015-03-21 LAB — GLUCOSE, CAPILLARY: Glucose-Capillary: 100 mg/dL — ABNORMAL HIGH (ref 65–99)

## 2015-03-21 MED ORDER — CEFAZOLIN SODIUM-DEXTROSE 2-3 GM-% IV SOLR
2.0000 g | INTRAVENOUS | Status: AC
  Administered 2015-03-22: 2 g via INTRAVENOUS
  Filled 2015-03-21: qty 50

## 2015-03-21 MED ORDER — ACETAMINOPHEN 500 MG PO TABS
1000.0000 mg | ORAL_TABLET | Freq: Once | ORAL | Status: DC
  Filled 2015-03-21: qty 2

## 2015-03-21 MED ORDER — CHLORHEXIDINE GLUCONATE 4 % EX LIQD
60.0000 mL | Freq: Once | CUTANEOUS | Status: AC
  Administered 2015-03-22: 4 via TOPICAL
  Filled 2015-03-21: qty 60

## 2015-03-21 MED ORDER — ACETAMINOPHEN 325 MG PO TABS
650.0000 mg | ORAL_TABLET | Freq: Four times a day (QID) | ORAL | Status: DC | PRN
  Administered 2015-03-21 – 2015-03-23 (×2): 650 mg via ORAL
  Filled 2015-03-21: qty 2

## 2015-03-21 NOTE — Progress Notes (Signed)
ANTICOAGULATION CONSULT NOTE - Follow Up Consult  Pharmacy Consult for heparin Indication: h/o PE/DVT   Labs:  Recent Labs  03/19/15 1836 03/20/15 0300 03/20/15 1707 03/21/15 0115  HGB 13.3 11.4*  --   --   HCT 40.2 35.1*  --   --   PLT 251 262  --   --   APTT  --  33  --   --   LABPROT 29.2* 30.6* 22.0*  --   INR 2.82* 3.00* 1.94*  --   HEPARINUNFRC  --   --   --  0.23*  CREATININE 1.13* 0.86  --   --      Assessment: 58yo female subtherapeutic on heparin with initial dosing while Coumadin on hold.  Goal of Therapy:  Heparin level 0.3-0.5 units/ml   Plan:  Will increase heparin gtt by 2 units/kg/hr to 1400 units/hr and check level in 6hr.  Susan Howell, PharmD, BCPS  03/21/2015,2:14 AM

## 2015-03-21 NOTE — Progress Notes (Signed)
MD made aware that pt has a temp of 100.3 without any medication ordered.

## 2015-03-21 NOTE — Progress Notes (Signed)
ANTICOAGULATION CONSULT NOTE - Follow Up Consult  Pharmacy Consult for Heparin Indication: h/o multiple PE's and DVT's (Coumadin on hold)  Allergies  Allergen Reactions  . Iodine Other (See Comments)    seizures  . Labetalol Hcl Hypertension  . Bactrim [Sulfamethoxazole-Trimethoprim] Other (See Comments)    R/T Coumadin  . Eszopiclone Other (See Comments)    REACTION: NIGHT MARES  . Iohexol Nausea And Vomiting and Other (See Comments)     Desc: NAUSEA,VOMITING & SZ S/P CONTRAST INJ 30 YRS AGO// OK W/ PRE MEDS, BENADRYL & SOLUMEDROL TODAY//A.C., Onset Date: [id number]   . Penicillins Nausea And Vomiting    Cannot take tablets by mouth; IV is fine.   . Smallpox Vaccine Other (See Comments)    Swelling at injection site, fever  . Tetanus Toxoids Swelling and Other (See Comments)    Fever, flushing and hardening of the injection site  . Topamax Other (See Comments)    Confusion, high blood pressure.   . Vancomycin Nausea Only    Severe chills  . Albuterol     Inhaler made patient heart race  . Nitrofuran Derivatives     Loss of appetite  . Aliskiren Fumarate Other (See Comments)    Caused SYNCOPE  . Codeine Itching    Patient Measurements: Height: 5' (152.4 cm) Weight: 192 lb 0.3 oz (87.1 kg) IBW/kg (Calculated) : 45.5 Heparin Dosing Weight: 66 kg  Vital Signs: Temp: 100.6 F (38.1 C) (05/25 1553) Temp Source: Oral (05/25 1553) BP: 104/57 mmHg (05/25 1405) Pulse Rate: 69 (05/25 1405)  Labs:  Recent Labs  03/19/15 1836 03/20/15 0300 03/20/15 1707 03/21/15 0115 03/21/15 0337 03/21/15 0819 03/21/15 1604  HGB 13.3 11.4*  --   --  10.2*  --   --   HCT 40.2 35.1*  --   --  31.6*  --   --   PLT 251 262  --   --  205  --   --   APTT  --  33  --   --   --   --   --   LABPROT 29.2* 30.6* 22.0*  --  18.4*  --   --   INR 2.82* 3.00* 1.94*  --  1.52*  --   --   HEPARINUNFRC  --   --   --  0.23*  --  0.75* 0.88*  CREATININE 1.13* 0.86  --   --  0.71  --   --      Estimated Creatinine Clearance: 74.2 mL/min (by C-G formula based on Cr of 0.71).  Assessment: Susan Howell with history of multiple PEs and LLE DVT on Coumadin PTA with therapeutic INR on admit. INR reversed with Vitamin K and Pharmacy consulted to start IV heparin bridge once INR is less than two. Noted CT shows small hematomas at sternum and abdominal wall. Vitamin K 10 mg IV given 5/24 at 10 am. Heparin started 5 /24.Heparin level has actually increased 0.75>0.88 despite lowering rate today.  Goal of Therapy:  HL 0.3-0.5 Monitor platelets by anticoagulation protocol: Yes   Plan:  Decrease IV heparin to 1050 units/hr (decrease by 3 units/kg/hr) Recheck HL in 8 hrs.  Hazel Leveille S. Merilynn Finlandobertson, PharmD, BCPS Clinical Staff Pharmacist Pager [phone]  Misty Stanleyobertson, Lafe Clerk Stillinger 03/21/2015,6:13 PM

## 2015-03-21 NOTE — Progress Notes (Signed)
Patient is made aware of that she is being transferred to [facility].  She called significant others and friends to make them aware the she is being transferred.  [facility]  Transfer per hospital bed.

## 2015-03-21 NOTE — Progress Notes (Signed)
Chaplin responded to a consult request by nurse. Patient's fiance (Micheal) was present. The Chaplin asked  If the Pt request prayer and she said yes The Pt. Informed the Chaplin the her pastor was on the way but "you can't get enough prayer". The chaplin prayed for the Pt and the family. We engaged is great conversation about her and her family. She exhibited tremendous faith. The Chaplin was encouraged by the patient's attitude and faith.  The Chaplin will follow up during her stay.

## 2015-03-21 NOTE — Progress Notes (Deleted)
MD made aware that pt now has temp of 100.3, no orders in place.
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
## 2015-03-22 ENCOUNTER — Inpatient Hospital Stay (HOSPITAL_COMMUNITY): Payer: Medicaid Other

## 2015-03-22 ENCOUNTER — Inpatient Hospital Stay (HOSPITAL_COMMUNITY): Payer: Medicaid Other | Admitting: Anesthesiology

## 2015-03-22 ENCOUNTER — Encounter (HOSPITAL_COMMUNITY): Payer: Self-pay | Admitting: Anesthesiology

## 2015-03-22 ENCOUNTER — Encounter (HOSPITAL_COMMUNITY): Admission: EM | Disposition: A | Discharge status: Skilled Nursing Facility | Payer: Self-pay | Source: Home / Self Care

## 2015-03-22 HISTORY — PX: ORIF ANKLE FRACTURE: SHX5408

## 2015-03-22 HISTORY — PX: OPEN REDUCTION INTERNAL FIXATION (ORIF) METACARPAL: SHX6234

## 2015-03-22 LAB — BASIC METABOLIC PANEL
Anion gap: 6 (ref 5–15)
BUN: 10 mg/dL (ref 6–20)
CHLORIDE: 106 mmol/L (ref 101–111)
CO2: 23 mmol/L (ref 22–32)
CREATININE: 0.66 mg/dL (ref 0.44–1.00)
Calcium: 7.9 mg/dL — ABNORMAL LOW (ref 8.9–10.3)
GFR calc Af Amer: >60 mL/min (ref >60)
GFR calc non Af Amer: >60 mL/min (ref >60)
Glucose, Bld: 101 mg/dL — ABNORMAL HIGH (ref 65–99)
Potassium: 4.1 mmol/L (ref 3.5–5.1)
Sodium: 135 mmol/L (ref 135–145)

## 2015-03-22 LAB — HEPARIN LEVEL (UNFRACTIONATED): HEPARIN UNFRACTIONATED: 0.47 [IU]/mL (ref 0.30–0.70)

## 2015-03-22 LAB — CBC
HCT: 29.3 % — ABNORMAL LOW (ref 36.0–46.0)
Hemoglobin: 9.5 g/dL — ABNORMAL LOW (ref 12.0–15.0)
MCH: 31.6 pg (ref 26.0–34.0)
MCHC: 32.4 g/dL (ref 30.0–36.0)
MCV: 97.3 fL (ref 78.0–100.0)
PLATELETS: 209 10*3/uL (ref 150–400)
RBC: 3.01 MIL/uL — AB (ref 3.87–5.11)
RDW: 14.6 % (ref 11.5–15.5)
WBC: 7.1 10*3/uL (ref 4.0–10.5)

## 2015-03-22 SURGERY — OPEN REDUCTION INTERNAL FIXATION (ORIF) ANKLE FRACTURE
Anesthesia: General | Site: Hand | Laterality: Left

## 2015-03-22 MED ORDER — ONDANSETRON HCL 4 MG/2ML IJ SOLN
INTRAMUSCULAR | Status: AC
  Filled 2015-03-22: qty 2

## 2015-03-22 MED ORDER — BUPIVACAINE HCL (PF) 0.25 % IJ SOLN
INTRAMUSCULAR | Status: AC
Start: 2015-03-22 — End: 2015-03-22
  Filled 2015-03-22: qty 30

## 2015-03-22 MED ORDER — MIDAZOLAM HCL 5 MG/5ML IJ SOLN
INTRAMUSCULAR | Status: DC | PRN
  Administered 2015-03-22: 2 mg via INTRAVENOUS

## 2015-03-22 MED ORDER — FENTANYL CITRATE (PF) 250 MCG/5ML IJ SOLN
INTRAMUSCULAR | Status: AC
  Filled 2015-03-22: qty 5

## 2015-03-22 MED ORDER — OXYCODONE HCL 5 MG/5ML PO SOLN: 5.0000 mg | Freq: Once | ORAL | Status: AC | PRN

## 2015-03-22 MED ORDER — BUPIVACAINE HCL (PF) 0.25 % IJ SOLN
INTRAMUSCULAR | Status: DC | PRN
  Administered 2015-03-22: 30 mL

## 2015-03-22 MED ORDER — LIDOCAINE HCL (CARDIAC) 20 MG/ML IV SOLN
INTRAVENOUS | Status: AC
  Filled 2015-03-22: qty 5

## 2015-03-22 MED ORDER — HYDROMORPHONE HCL 1 MG/ML IJ SOLN
INTRAMUSCULAR | Status: AC
  Filled 2015-03-22: qty 2

## 2015-03-22 MED ORDER — OXYCODONE HCL 5 MG PO TABS
5.0000 mg | ORAL_TABLET | Freq: Once | ORAL | Status: AC | PRN
  Administered 2015-03-22: 5 mg via ORAL

## 2015-03-22 MED ORDER — HYDROMORPHONE HCL 1 MG/ML IJ SOLN
0.2500 mg | INTRAMUSCULAR | Status: DC | PRN
  Administered 2015-03-22 (×4): 0.5 mg via INTRAVENOUS

## 2015-03-22 MED ORDER — KETOROLAC TROMETHAMINE 30 MG/ML IJ SOLN
INTRAMUSCULAR | Status: DC | PRN
  Administered 2015-03-22: 30 mg via INTRAVENOUS

## 2015-03-22 MED ORDER — ONDANSETRON HCL 4 MG PO TABS: 4.0000 mg | ORAL_TABLET | Freq: Three times a day (TID) | ORAL | Status: DC | PRN

## 2015-03-22 MED ORDER — LACTATED RINGERS IV SOLN
INTRAVENOUS | Status: DC | PRN
  Administered 2015-03-22 (×2): via INTRAVENOUS

## 2015-03-22 MED ORDER — FENTANYL CITRATE (PF) 100 MCG/2ML IJ SOLN
INTRAMUSCULAR | Status: DC | PRN
  Administered 2015-03-22: 50 ug via INTRAVENOUS
  Administered 2015-03-22 (×2): 100 ug via INTRAVENOUS
  Administered 2015-03-22 (×2): 50 ug via INTRAVENOUS
  Administered 2015-03-22: 100 ug via INTRAVENOUS
  Administered 2015-03-22: 50 ug via INTRAVENOUS

## 2015-03-22 MED ORDER — ROCURONIUM BROMIDE 50 MG/5ML IV SOLN
INTRAVENOUS | Status: AC
  Filled 2015-03-22: qty 1

## 2015-03-22 MED ORDER — KETOROLAC TROMETHAMINE 30 MG/ML IJ SOLN
INTRAMUSCULAR | Status: AC
  Filled 2015-03-22: qty 1

## 2015-03-22 MED ORDER — MIDAZOLAM HCL 2 MG/2ML IJ SOLN
INTRAMUSCULAR | Status: AC
  Filled 2015-03-22: qty 2

## 2015-03-22 MED ORDER — OXYCODONE HCL 5 MG PO TABS
ORAL_TABLET | ORAL | Status: AC
  Filled 2015-03-22: qty 1

## 2015-03-22 MED ORDER — LIDOCAINE HCL (CARDIAC) 20 MG/ML IV SOLN
INTRAVENOUS | Status: DC | PRN
  Administered 2015-03-22: 60 mg via INTRAVENOUS

## 2015-03-22 MED ORDER — PROPOFOL 10 MG/ML IV BOLUS
INTRAVENOUS | Status: DC | PRN
  Administered 2015-03-22: 150 mg via INTRAVENOUS

## 2015-03-22 MED ORDER — PHENYLEPHRINE HCL 10 MG/ML IJ SOLN
INTRAMUSCULAR | Status: DC | PRN
  Administered 2015-03-22 (×2): 80 ug via INTRAVENOUS

## 2015-03-22 MED ORDER — LACTATED RINGERS IV SOLN
INTRAVENOUS | Status: DC
  Administered 2015-03-22: 21:00:00 via INTRAVENOUS

## 2015-03-22 MED ORDER — 0.9 % SODIUM CHLORIDE (POUR BTL) OPTIME
TOPICAL | Status: DC | PRN
  Administered 2015-03-22: 1000 mL

## 2015-03-22 MED ORDER — ONDANSETRON HCL 4 MG/2ML IJ SOLN
INTRAMUSCULAR | Status: DC | PRN
  Administered 2015-03-22: 4 mg via INTRAVENOUS

## 2015-03-22 MED ORDER — OXYCODONE-ACETAMINOPHEN 5-325 MG PO TABS: 1.0000 | ORAL_TABLET | ORAL | Status: DC | PRN

## 2015-03-22 MED ORDER — HEPARIN (PORCINE) IN NACL 100-0.45 UNIT/ML-% IJ SOLN
1150.0000 [IU]/h | INTRAMUSCULAR | Status: DC
  Administered 2015-03-23: 1050 [IU]/h via INTRAVENOUS
  Filled 2015-03-22 (×2): qty 250

## 2015-03-22 MED ORDER — DIPHENHYDRAMINE HCL 50 MG/ML IJ SOLN
INTRAMUSCULAR | Status: DC | PRN
  Administered 2015-03-22: 25 mg via INTRAVENOUS

## 2015-03-22 MED ORDER — ROCURONIUM BROMIDE 100 MG/10ML IV SOLN
INTRAVENOUS | Status: DC | PRN
  Administered 2015-03-22: 30 mg via INTRAVENOUS

## 2015-03-22 MED ORDER — PROPOFOL 10 MG/ML IV BOLUS
INTRAVENOUS | Status: AC
  Filled 2015-03-22: qty 20

## 2015-03-22 MED ORDER — BUPIVACAINE HCL (PF) 0.25 % IJ SOLN
INTRAMUSCULAR | Status: AC
  Filled 2015-03-22: qty 30

## 2015-03-22 MED ORDER — SUCCINYLCHOLINE CHLORIDE 20 MG/ML IJ SOLN
INTRAMUSCULAR | Status: DC | PRN
  Administered 2015-03-22: 100 mg via INTRAVENOUS

## 2015-03-22 MED ORDER — CEFAZOLIN SODIUM-DEXTROSE 2-3 GM-% IV SOLR
2.0000 g | Freq: Four times a day (QID) | INTRAVENOUS | Status: AC
  Administered 2015-03-22 – 2015-03-23 (×3): 2 g via INTRAVENOUS
  Filled 2015-03-22 (×3): qty 50

## 2015-03-22 MED ORDER — ONDANSETRON HCL 4 MG/2ML IJ SOLN: 4.0000 mg | Freq: Four times a day (QID) | INTRAMUSCULAR | Status: DC | PRN

## 2015-03-22 SURGICAL SUPPLY — 102 items
7-hole t-plate (Plate) ×3 IMPLANT
BANDAGE ELASTIC 3 VELCRO ST LF (GAUZE/BANDAGES/DRESSINGS) ×3 IMPLANT
BANDAGE ELASTIC 4 VELCRO ST LF (GAUZE/BANDAGES/DRESSINGS) ×6 IMPLANT
BANDAGE ELASTIC 6 VELCRO ST LF (GAUZE/BANDAGES/DRESSINGS) ×3 IMPLANT
BANDAGE ESMARK 6X9 LF (GAUZE/BANDAGES/DRESSINGS) ×2 IMPLANT
BIT DRILL 1.9 AO (BIT) ×4
BIT DRILL 1.9MM AO (BIT) ×4 IMPLANT
BIT DRILL 14MM DIA AO (BIT) ×2 IMPLANT
BIT DRILL 2.5X125 (BIT) ×3 IMPLANT
BIT DRILL 3.5X125 (BIT) ×2 IMPLANT
BIT DRILL CANN 2.7 (BIT) ×1
BIT DRILL SRG 2.7XCANN AO CPLG (BIT) ×2 IMPLANT
BIT DRL SRG 2.7XCANN AO CPLNG (BIT) ×2
BNDG ESMARK 6X9 LF (GAUZE/BANDAGES/DRESSINGS) ×3
CANISTER SUCTION 2500CC (MISCELLANEOUS) ×6 IMPLANT
COVER SURGICAL LIGHT HANDLE (MISCELLANEOUS) ×3 IMPLANT
CUFF TOURNIQUET SINGLE 18IN (TOURNIQUET CUFF) ×3 IMPLANT
CUFF TOURNIQUET SINGLE 34IN LL (TOURNIQUET CUFF) ×3 IMPLANT
DRAPE C-ARM 42X72 X-RAY (DRAPES) IMPLANT
DRAPE C-ARM MINI 42X72 WSTRAPS (DRAPES) ×6 IMPLANT
DRAPE C-ARMOR (DRAPES) IMPLANT
DRAPE ORTHO SPLIT 77X108 STRL (DRAPES)
DRAPE SURG ORHT 6 SPLT 77X108 (DRAPES) IMPLANT
DRAPE U-SHAPE 47X51 STRL (DRAPES) IMPLANT
DRILL BIT 1.9MM AO (BIT) ×2
DRILL BIT 14MM DIA AO (BIT) ×3
DRILL BIT 3.5X125 (BIT) ×1
DRSG ADAPTIC 3X8 NADH LF (GAUZE/BANDAGES/DRESSINGS) ×3 IMPLANT
DRSG PAD ABDOMINAL 8X10 ST (GAUZE/BANDAGES/DRESSINGS) ×3 IMPLANT
DURAPREP 26ML APPLICATOR (WOUND CARE) ×3 IMPLANT
ELECT REM PT RETURN 9FT ADLT (ELECTROSURGICAL) ×3
ELECTRODE REM PT RTRN 9FT ADLT (ELECTROSURGICAL) ×2 IMPLANT
GAUZE SPONGE 4X4 12PLY STRL (GAUZE/BANDAGES/DRESSINGS) ×6 IMPLANT
GAUZE XEROFORM 1X8 LF (GAUZE/BANDAGES/DRESSINGS) ×3 IMPLANT
GAUZE XEROFORM 5X9 LF (GAUZE/BANDAGES/DRESSINGS) ×3 IMPLANT
GLOVE BIO SURGEON STRL SZ7 (GLOVE) ×3 IMPLANT
GLOVE BIO SURGEON STRL SZ7.5 (GLOVE) ×6 IMPLANT
GLOVE BIO SURGEON STRL SZ8 (GLOVE) ×3 IMPLANT
GLOVE BIOGEL PI IND STRL 7.0 (GLOVE) ×2 IMPLANT
GLOVE BIOGEL PI IND STRL 8 (GLOVE) ×2 IMPLANT
GLOVE BIOGEL PI INDICATOR 7.0 (GLOVE) ×1
GLOVE BIOGEL PI INDICATOR 8 (GLOVE) ×1
GLOVE BIOGEL PI ORTHO PRO SZ8 (GLOVE)
GLOVE PI ORTHO PRO STRL SZ8 (GLOVE) IMPLANT
GOWN STRL REUS W/ TWL LRG LVL3 (GOWN DISPOSABLE) ×2 IMPLANT
GOWN STRL REUS W/ TWL XL LVL3 (GOWN DISPOSABLE) ×2 IMPLANT
GOWN STRL REUS W/TWL 2XL LVL3 (GOWN DISPOSABLE) IMPLANT
GOWN STRL REUS W/TWL LRG LVL3 (GOWN DISPOSABLE) ×1
GOWN STRL REUS W/TWL XL LVL3 (GOWN DISPOSABLE) ×1
K-WIRE ORTHOPEDIC 1.4X150L (WIRE) ×6
KIT BASIN OR (CUSTOM PROCEDURE TRAY) ×3 IMPLANT
KIT ROOM TURNOVER OR (KITS) ×3 IMPLANT
KWIRE ORTHOPEDIC 1.4X150L (WIRE) ×4 IMPLANT
MANIFOLD NEPTUNE II (INSTRUMENTS) IMPLANT
NEEDLE 22X1 1/2 (OR ONLY) (NEEDLE) ×6 IMPLANT
NS IRRIG 1000ML POUR BTL (IV SOLUTION) ×6 IMPLANT
PACK ORTHO EXTREMITY (CUSTOM PROCEDURE TRAY) ×6 IMPLANT
PAD ARMBOARD 7.5X6 YLW CONV (MISCELLANEOUS) ×6 IMPLANT
PAD CAST 3X4 CTTN HI CHSV (CAST SUPPLIES) ×2 IMPLANT
PAD CAST 4YDX4 CTTN HI CHSV (CAST SUPPLIES) ×4 IMPLANT
PADDING CAST COTTON 3X4 STRL (CAST SUPPLIES) ×1
PADDING CAST COTTON 4X4 STRL (CAST SUPPLIES) ×2
PADDING CAST COTTON 6X4 STRL (CAST SUPPLIES) ×3 IMPLANT
PLATE 1/3 TUBULAR 7H (Plate) ×3 IMPLANT
PLATE STRAIGHT 6H COMPRESSION (Plate) ×3 IMPLANT
SCREW BONE 2.3X10MM (Screw) ×6 IMPLANT
SCREW BONE 2.3X12 (Screw) ×6 IMPLANT
SCREW BONE 2.3X15MM (Screw) ×3 IMPLANT
SCREW BONE LOCK FT 1.7X6 (Screw) ×9 IMPLANT
SCREW BONE LOCK FT 2.3X16 (Screw) ×3 IMPLANT
SCREW CANC FT 16X4X2.5XHEX (Screw) ×2 IMPLANT
SCREW CANCELLOUS 4.0X14 (Screw) ×3 IMPLANT
SCREW CANCELLOUS 4.0X16MM (Screw) ×1 IMPLANT
SCREW CANN 44X15X4XSLF DRL (Screw) ×4 IMPLANT
SCREW CANNULATED 4.0X44MM (Screw) ×2 IMPLANT
SCREW CORT 1.7X10MM (Screw) ×3 IMPLANT
SCREW CORT 1.7X8MM (Screw) ×3 IMPLANT
SCREW CORTEX ST MATTA 3.5X12MM (Screw) ×6 IMPLANT
SCREW CORTEX ST MATTA 3.5X14 (Screw) ×6 IMPLANT
SCREW CORTEX ST MATTA 3.5X16MM (Screw) ×6 IMPLANT
SPLINT FIBERGLASS 3X12 (CAST SUPPLIES) ×3 IMPLANT
SPLINT PLASTER CAST XFAST 5X30 (CAST SUPPLIES) ×2 IMPLANT
SPLINT PLASTER XFAST SET 5X30 (CAST SUPPLIES) ×1
SPONGE LAP 18X18 X RAY DECT (DISPOSABLE) ×3 IMPLANT
STRIP CLOSURE SKIN 1/2X4 (GAUZE/BANDAGES/DRESSINGS) ×3 IMPLANT
SUCTION FRAZIER TIP 10 FR DISP (SUCTIONS) ×3 IMPLANT
SUT ETHILON 3 0 PS 1 (SUTURE) ×3 IMPLANT
SUT ETHILON 5 0 PS 2 18 (SUTURE) ×3 IMPLANT
SUT MNCRL AB 4-0 PS2 18 (SUTURE) ×6 IMPLANT
SUT MON AB 2-0 CT1 27 (SUTURE) ×3 IMPLANT
SUT VIC AB 0 CT1 27 (SUTURE) ×1
SUT VIC AB 0 CT1 27XBRD ANBCTR (SUTURE) ×2 IMPLANT
SUT VIC AB 4-0 TF 27 (SUTURE) ×3 IMPLANT
SYR BULB IRRIGATION 50ML (SYRINGE) ×3 IMPLANT
SYR CONTROL 10ML LL (SYRINGE) ×6 IMPLANT
TOWEL OR 17X24 6PK STRL BLUE (TOWEL DISPOSABLE) ×3 IMPLANT
TOWEL OR 17X26 10 PK STRL BLUE (TOWEL DISPOSABLE) ×3 IMPLANT
TUBE CONNECTING 12X1/4 (SUCTIONS) ×3 IMPLANT
UNDERPAD 30X30 INCONTINENT (UNDERPADS AND DIAPERS) ×3 IMPLANT
WATER STERILE IRR 1000ML POUR (IV SOLUTION) IMPLANT
YANKAUER SUCT BULB TIP NO VENT (SUCTIONS) ×3 IMPLANT
surgical steel wire, 26gauge (Wire) ×3 IMPLANT

## 2015-03-22 NOTE — Anesthesia Procedure Notes (Signed)
Procedure Name: Intubation Date/Time: 03/22/2015 2:01 PM Performed by: Gavin PoundLOWDER, Braedon Sjogren J Pre-anesthesia Checklist: Patient identified, Emergency Drugs available, Suction available, Patient being monitored and Timeout performed Patient Re-evaluated:Patient Re-evaluated prior to inductionOxygen Delivery Method: Circle system utilized Intubation Type: IV induction Laryngoscope Size: Mac and 3 Grade View: Grade I Tube size: 7.0 mm Number of attempts: 1 Placement Confirmation: ETT inserted through vocal cords under direct vision,  positive ETCO2 and breath sounds checked- equal and bilateral Secured at: 20 cm Tube secured with: Tape Dental Injury: Teeth and Oropharynx as per pre-operative assessment
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
## 2015-03-22 NOTE — Progress Notes (Signed)
PHARMACY NOTE  Pharmacy Consult :  59 y.o. female will be restarted post-op Heparin for history of multiple PE's and DVT's.   Dosing Wt :  83 kg  LABS :  Recent Labs  03/19/15 1836 03/20/15 0300 03/20/15 1707 03/21/15 0115 03/21/15 0337 03/21/15 0819 03/21/15 1604 03/22/15 0443  HGB 13.3 11.4*  --   --  10.2*  --   --  9.5*  HCT 40.2 35.1*  --   --  31.6*  --   --  29.3*  PLT 251 262  --   --  205  --   --  209  APTT  --  33  --   --   --   --   --   --   LABPROT 29.2* 30.6* 22.0*  --  18.4*  --   --   --   INR 2.82* 3.00* 1.94*  --  1.52*  --   --   --   HEPARINUNFRC  --   --   --  0.23*  --  0.75* 0.88* 0.47  CREATININE 1.13* 0.86  --   --  0.71  --   --  0.66    MEDICATION: Infusion[s]: Infusions:  . 0.9 % NaCl with KCl 20 mEq / L 10 mL/hr at 03/22/15 0600  . Restart heparin on 03/23/15 @ previous rate    . lactated ringers     ASSESSMENT :  59 y.o. female to be restarted on Heparin at 0300 AM.   Last Heparin rate at 1050 units/hr.  Last Heparin level 0.47 units/ml.  No evidence of bleeding while on Heparin previously.Marland Kitchen.  GOAL :  Heparin Level  0.3 - 0.5 units/ml  PLAN : 1. Heparin will be restarted at 0300 AM at previous rate of 1050 units/hr, No Bolus..    2. The next Heparin Level will be drawn in 6 hours  Emmanuelle Coxe,  Pharm.D   03/22/2015,  5:31 PM

## 2015-03-22 NOTE — Discharge Instructions (Signed)
NWB in the LLE and RLE  Keep splint clean and dry till follow up  Keep knee immobilizer on at all times on the RLE

## 2015-03-22 NOTE — Transfer of Care (Signed)
Immediate Anesthesia Transfer of Care Note  Patient: Susan Howell  Procedure(s) Performed: Procedure(s): OPEN REDUCTION INTERNAL FIXATION (ORIF) BIMAL ANKLE FRACTURE (Left) OPEN REDUCTION INTERNAL FIXATION (ORIF) LEFT SMALL FINGER (Left)  Patient Location: PACU  Anesthesia Type:General  Level of Consciousness: awake and alert   Airway & Oxygen Therapy: Patient Spontanous Breathing and Patient connected to nasal cannula oxygen  Post-op Assessment: Report given to RN and Post -op Vital signs reviewed and stable  Post vital signs: Reviewed and stable  Last Vitals:  Filed Vitals:   03/22/15 1615  BP:   Pulse:   Temp: 37.2 C  Resp:     Complications: No apparent anesthesia complications

## 2015-03-22 NOTE — Anesthesia Preprocedure Evaluation (Addendum)
Anesthesia Evaluation  Patient identified by MRN, date of birth, ID band Patient awake    Reviewed: Allergy & Precautions, H&P , NPO status , Patient's Chart, lab work & pertinent test results  History of Anesthesia Complications Negative for: history of anesthetic complications  Airway Mallampati: I  TM Distance: >3 FB Neck ROM: full    Dental  (+) Upper Dentures, Lower Dentures, Dental Advidsory Given   Pulmonary shortness of breath, sleep apnea ,  Hx of PE   + decreased breath sounds      Cardiovascular hypertension, On Medications +CHF + pacemaker Rhythm:Regular Rate:Normal     Neuro/Psych  Headaches, PSYCHIATRIC DISORDERS TIA Neuromuscular disease    GI/Hepatic negative GI ROS, Neg liver ROS,   Endo/Other  Morbid obesity  Renal/GU negative Renal ROS     Musculoskeletal  (+) Arthritis -, Fibromyalgia -  Abdominal   Peds  Hematology On anticoagulatrion   Anesthesia Other Findings   Reproductive/Obstetrics                            Anesthesia Physical Anesthesia Plan  ASA: III  Anesthesia Plan: General   Post-op Pain Management:    Induction: Intravenous  Airway Management Planned: Oral ETT  Additional Equipment:   Intra-op Plan:   Post-operative Plan: Extubation in OR  Informed Consent: I have reviewed the patients History and Physical, chart, labs and discussed the procedure including the risks, benefits and alternatives for the proposed anesthesia with the patient or authorized representative who has indicated his/her understanding and acceptance.   Dental advisory given and Dental Advisory Given  Plan Discussed with: Anesthesiologist, CRNA and Surgeon  Anesthesia Plan Comments:        Anesthesia Quick Evaluation

## 2015-03-23 LAB — HEPARIN LEVEL (UNFRACTIONATED)
Heparin Unfractionated: 0.23 IU/mL — ABNORMAL LOW (ref 0.30–0.70)
Heparin Unfractionated: 0.24 IU/mL — ABNORMAL LOW (ref 0.30–0.70)

## 2015-03-23 LAB — CBC
HCT: 27.9 % — ABNORMAL LOW (ref 36.0–46.0)
Hemoglobin: 9.2 g/dL — ABNORMAL LOW (ref 12.0–15.0)
MCH: 31.8 pg (ref 26.0–34.0)
MCHC: 33 g/dL (ref 30.0–36.0)
MCV: 96.5 fL (ref 78.0–100.0)
PLATELETS: 216 10*3/uL (ref 150–400)
RBC: 2.89 MIL/uL — ABNORMAL LOW (ref 3.87–5.11)
RDW: 14.6 % (ref 11.5–15.5)
WBC: 8.9 10*3/uL (ref 4.0–10.5)

## 2015-03-23 MED ORDER — DOCUSATE SODIUM 100 MG PO CAPS
100.0000 mg | ORAL_CAPSULE | Freq: Two times a day (BID) | ORAL | Status: DC
  Administered 2015-03-23 – 2015-03-27 (×9): 100 mg via ORAL
  Filled 2015-03-23 (×10): qty 1

## 2015-03-23 MED ORDER — POLYETHYLENE GLYCOL 3350 17 G PO PACK
17.0000 g | PACK | Freq: Two times a day (BID) | ORAL | Status: DC
  Administered 2015-03-23 – 2015-03-27 (×8): 17 g via ORAL
  Filled 2015-03-23 (×7): qty 1

## 2015-03-23 MED ORDER — WARFARIN - PHARMACIST DOSING INPATIENT
Freq: Every day | Status: DC
Start: 2015-03-23 — End: 2015-03-27
  Administered 2015-03-23: 18:00:00

## 2015-03-23 MED ORDER — WARFARIN SODIUM 7.5 MG PO TABS
7.5000 mg | ORAL_TABLET | Freq: Once | ORAL | Status: AC
  Administered 2015-03-23: 7.5 mg via ORAL
  Filled 2015-03-23: qty 1

## 2015-03-23 MED ORDER — FLEET ENEMA 7-19 GM/118ML RE ENEM
1.0000 | ENEMA | Freq: Every day | RECTAL | Status: DC | PRN
  Administered 2015-03-23: 1 via RECTAL
  Filled 2015-03-23: qty 1

## 2015-03-23 MED ORDER — HEPARIN (PORCINE) IN NACL 100-0.45 UNIT/ML-% IJ SOLN
1600.0000 [IU]/h | INTRAMUSCULAR | Status: DC
  Administered 2015-03-24 (×2): 1300 [IU]/h via INTRAVENOUS
  Administered 2015-03-25 – 2015-03-26 (×2): 1450 [IU]/h via INTRAVENOUS
  Filled 2015-03-23 (×6): qty 250
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
## 2015-03-23 NOTE — Anesthesia Postprocedure Evaluation (Signed)
Anesthesia Post Note  Patient: Susan RathkeDonna K Zabodyn  Procedure(s) Performed: Procedure(s) (LRB): OPEN REDUCTION INTERNAL FIXATION (ORIF) BIMAL ANKLE FRACTURE (Left) OPEN REDUCTION INTERNAL FIXATION (ORIF) LEFT SMALL FINGER (Left)  Anesthesia type: General  Patient location: PACU  Post pain: Pain level controlled and Adequate analgesia  Post assessment: Post-op Vital signs reviewed, Patient's Cardiovascular Status Stable, Respiratory Function Stable, Patent Airway and Pain level controlled  Last Vitals:  Filed Vitals:   03/23/15 0600  BP: 118/65  Pulse: 76  Temp: 37.3 C  Resp: 18    Post vital signs: Reviewed and stable  Level of consciousness: awake, alert  and oriented  Complications: No apparent anesthesia complications

## 2015-03-23 NOTE — Progress Notes (Addendum)
PHARMACY NOTE  Pharmacy Consult :  59 y.o. female will be restarted post-op Heparin for history of multiple PE's and DVT's. Resume coumadin as well.  Dosing Wt :  83 kg  LABS :  Recent Labs  03/20/15 1707 03/21/15 0115 03/21/15 0337 03/21/15 0819 03/21/15 1604 03/22/15 0443 03/23/15 0455 03/23/15 0839  HGB  --   --  10.2*  --   --  9.5* 9.2*  --   HCT  --   --  31.6*  --   --  29.3* 27.9*  --   PLT  --   --  205  --   --  209 216  --   LABPROT 22.0*  --  18.4*  --   --   --   --   --   INR 1.94*  --  1.52*  --   --   --   --   --   HEPARINUNFRC  --  0.23*  --  0.75* 0.88* 0.47  --  0.23*  CREATININE  --   --  0.71  --   --  0.66  --   --     MEDICATION: Infusion[s]: Infusions:  . 0.9 % NaCl with KCl 20 mEq / L 10 mL/hr at 03/22/15 0600  . Restart heparin on 03/23/15 @ previous rate    . lactated ringers     ASSESSMENT :  Heparin level low this am @ 0.23 units/ml.    No evidence of bleeding, ABLA noted  GOAL :  Heparin Level  0.3 - 0.5 units/ml  PLAN  Increase heparin to 1150 units/hr Heparin Level in 6 hours Coumadin 7.5 mg today Daily PT/INR Monitor for bleeding complications  Thanks for allowing pharmacy to be a part of this patient's care.  Talbert CageLora Seay, PharmD Clinical Pharmacist, [phone]  03/23/2015,  10:14 AM     Addendum:  Heparin level came back at 0.24 this PM so it barely moved.  Increase rate to 1300 units/hr Check 6 hr level  Ulyses SouthwardMinh Clarence Cogswell, PharmD Pager: [phone] 03/23/2015 6:07 PM
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
## 2015-03-23 NOTE — Care Management Note (Signed)
Case Management Note  Patient Details  Name: Susan Howell MRN: [id number] Date of Birth: 02/26/1956  Subjective/Objective:    PT evaluation today; recommending SNF at dc for rehab.                Action/Plan: Referral to CSW to facilitate possible dc to SNF when medically stable for dc.    Expected Discharge Date:                  Expected Discharge Plan:  Skilled Nursing Facility  In-House Referral:  Clinical Social Work  Discharge planning Services  CM Consult  Post Acute Care Choice:    Choice offered to:     DME Arranged:    DME Agency:     HH Arranged:    HH Agency:     Status of Service:  In process, will continue to follow  Medicare Important Message Given:    Date Medicare IM Given:    Medicare IM give by:    Date Additional Medicare IM Given:    Additional Medicare Important Message give by:     If discussed at Long Length of Stay Meetings, dates discussed:    Additional Comments:  Quintella BatonJulie W. Cheree Fowles, RN, BSN  Trauma/Neuro ICU Case Manager [phone]

## 2015-03-23 NOTE — Progress Notes (Signed)
Foley d/c'd. No complications. Pt tolerated well. Will continue to monitor.

## 2015-03-24 LAB — PROTIME-INR
INR: 1.29 (ref 0.00–1.49)
Prothrombin Time: 16.2 s — ABNORMAL HIGH (ref 11.6–15.2)

## 2015-03-24 LAB — CBC
HCT: 26.5 % — ABNORMAL LOW (ref 36.0–46.0)
HEMOGLOBIN: 8.8 g/dL — AB (ref 12.0–15.0)
MCH: 32 pg (ref 26.0–34.0)
MCHC: 33.2 g/dL (ref 30.0–36.0)
MCV: 96.4 fL (ref 78.0–100.0)
PLATELETS: 260 10*3/uL (ref 150–400)
RBC: 2.75 MIL/uL — ABNORMAL LOW (ref 3.87–5.11)
RDW: 14.1 % (ref 11.5–15.5)
WBC: 8.8 10*3/uL (ref 4.0–10.5)

## 2015-03-24 LAB — HEPARIN LEVEL (UNFRACTIONATED)
HEPARIN UNFRACTIONATED: 0.36 [IU]/mL (ref 0.30–0.70)
Heparin Unfractionated: 0.38 [IU]/mL (ref 0.30–0.70)

## 2015-03-24 MED ORDER — CALCIUM CARBONATE ANTACID 500 MG PO CHEW
2.0000 | CHEWABLE_TABLET | ORAL | Status: DC | PRN
  Administered 2015-03-24: 400 mg via ORAL
  Filled 2015-03-24: qty 2

## 2015-03-24 MED ORDER — WARFARIN SODIUM 7.5 MG PO TABS
7.5000 mg | ORAL_TABLET | Freq: Once | ORAL | Status: AC
Start: 2015-03-24 — End: 2015-03-24
  Administered 2015-03-24: 7.5 mg via ORAL
  Filled 2015-03-24: qty 1

## 2015-03-24 NOTE — Progress Notes (Signed)
PHARMACY NOTE    Dosing Wt :  83 kg  LABS :  Recent Labs  03/22/15 0443 03/23/15 0455 03/23/15 0839 03/23/15 1722 03/24/15 0048 03/24/15 0627  HGB 9.5* 9.2*  --   --  8.8*  --   HCT 29.3* 27.9*  --   --  26.5*  --   PLT 209 216  --   --  260  --   LABPROT  --   --   --   --  16.2*  --   INR  --   --   --   --  1.29  --   HEPARINUNFRC 0.47  --  0.23* 0.24* 0.38 0.36  CREATININE 0.66  --   --   --   --   --     Pharmacy Consult :  59 y.o. female on heparin bridge and coumadin for history of multiple PE's and DVT's s/p ORIF L ankle fx.    AM heparin level therapeutic at 0.36 on rate of 1300 units/hr.  INR 1.29 after coumadin just resumed yesterday.  Home coumadin dose 7.5mg  daily except 5mg  on Tues/Sun. H/H 8.8/26.5 pltc 260.  No bleeding reported.   GOAL :  Heparin Level  0.3 - 0.5 units/ml (low goal per MD request)  INR 2-3  PLAN  -continue heparin at 1300 units/hr - repeat Coumadin 7.5 mg today Daily PT/INR, HL, CBC Monitor for bleeding complications  Herby AbrahamMichelle T. Marybeth Dandy, Pharm.D. [phone] 03/24/2015 11:07 AM
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
## 2015-03-24 NOTE — Clinical Social Work Note (Signed)
Clinical Social Work Assessment  Patient Details  Name: Susan Howell MRN: [id number] Date of Birth: [birth year]  Date of referral:  03/24/15               Reason for consult:  Facility Placement, Discharge Planning                Permission sought to share information with:  Facility Sport and exercise psychologist, Family Supports Permission granted to share information::  Yes, Verbal Permission Granted  Name::     [place]::  [facility]'s   Relationship::  Fiance   Contact Information:  cell: [phone]  Housing/Transportation Living arrangements for the past 2 months:  Single Family Home Source of Information:  Patient, Other (Comment Required) Paramedic ) Patient Interpreter Needed:  None Criminal Activity/Legal Involvement Pertinent to Current Situation/Hospitalization:  No - Comment as needed Significant Relationships:  Adult Children, Friend, Other Family Members, Other(Comment) (Fiance ) Lives with:  Significant Other Do you feel safe going back to the place where you live?  Yes Need for family participation in patient care:  Yes (Comment) (Significant other )  Care giving concerns:  Pt has concerns about facilities not accepting her or not having adequate assistance for her current condition.   Social Worker assessment / plan: CSW met with the Pt and fiance at the bedside for completion of assessment. CSW introduced self and explained reason for assessment. Pt was aware of the need for SNF placement. Pt was concerned about the length of stay and possible cost of the stay. Pt did say that her automobile insurance ([employer]) would assist with her medical bills. Pt was pleasant with a positive disposition about current medical status. Pt lives in [place] and would like placement in that vacinity. Pt did give CSW permission to search in [place].   Employment status:  Software engineer:  Medicaid In [place], Other (Comment Required)  [employer] will assist with cost of admission and rehab  per Pt ) PT Recommendations:  [place] / Referral to community resources:  [place]  Patient/Family's Response to care: Pt in agreement to SNF placement.   Patient/Family's Understanding of and Emotional Response to Diagnosis, Current Treatment, and Prognosis:  Pt is aware of recovery timeframe and is inline with treatment recommendations.   Emotional Assessment Appearance:  Well-Groomed, Developmentally appropriate Attitude/Demeanor/Rapport:    Affect (typically observed):  Pleasant, Stable, Accepting, Calm Orientation:  Oriented to Self, Oriented to Place, Oriented to  Time, Oriented to Situation Alcohol / Substance use:  Never Used Psych involvement (Current and /or in the community):  No (Comment)  Discharge Needs  Concerns to be addressed:  Financial / Insurance Concerns, Discharge Planning Concerns Readmission within the last 30 days:  No Current discharge risk:  Physical Impairment Barriers to Discharge:  No Barriers Identified   Pete Pelt 03/24/2015, 3:21 PM

## 2015-03-24 NOTE — Clinical Social Work Placement (Addendum)
   CLINICAL SOCIAL WORK PLACEMENT  NOTE  Date:  03/24/2015  Patient Details  Name: Susan RathkeDonna K Howell MRN: [id number] Date of Birth: 05/31/1956  Clinical Social Work is seeking post-discharge placement for this patient at the Skilled  Nursing Facility level of care (*CSW will initial, date and re-position this form in  chart as items are completed):  Yes   Patient/family provided with [facility] Clinical Social Work Department's list of facilities offering this level of care within the geographic area requested by the patient (or if unable, by the patient's family).  Yes   Patient/family informed of their freedom to choose among providers that offer the needed level of care, that participate in Medicare, Medicaid or managed care program needed by the patient, have an available bed and are willing to accept the patient.  Yes   Patient/family informed of [facility]'s ownership interest in [facility] and [facility], as well as of the fact that they are under no obligation to receive care at these facilities.  PASRR submitted to EDS on 03/24/15     PASRR number received on 03/24/15     Existing PASRR number confirmed on       FL2 transmitted to all facilities in geographic area requested by pt/family on 03/24/15     FL2 transmitted to all facilities within larger geographic area on       Patient informed that his/her managed care company has contracts with or will negotiate with certain facilities, including the following:         Patient/family informed of bed offers received. 03/27/15 (03/27/15 updated Susan Howell, MSW, LCSWA)  Patient [facility] bed at  [facility] (03/27/15 updated Susan Howell, MSW, [facility])     Physician recommends and patient chooses bed at      Patient to be transferred to  [facility] on  03/27/15 (03/27/15 updated Susan MouldingEric Kamaiyah Howell, MSW, LCSWA).  Patient to be transferred to facility by  [facility] (03/27/15 updated Susan Howell, MSW, LCSWA)     Patient family notified on  03/27/15 of transfer.  (03/27/15 updated Susan Howell, MSW, Theresia MajorsLCSWA)  Name of family member notified:   Patient and Susan NeedleMichael her fiancee (03/27/15 updated Susan MouldingEric Talani Brazee, MSW, LCSWA)    PHYSICIAN Please sign FL2     Additional Comment:    _______________________________________________ Leron CroakAllen, Cassandra 03/24/2015, 3:20 PM  Ervin KnackEric R. Baley Lorimer, MSW, Theresia MajorsLCSWA [phone] 03/27/2015 3:22 PM
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
## 2015-03-25 LAB — CBC
HCT: 27.3 % — ABNORMAL LOW (ref 36.0–46.0)
HEMOGLOBIN: 8.8 g/dL — AB (ref 12.0–15.0)
MCH: 31.4 pg (ref 26.0–34.0)
MCHC: 32.2 g/dL (ref 30.0–36.0)
MCV: 97.5 fL (ref 78.0–100.0)
Platelets: 315 10*3/uL (ref 150–400)
RBC: 2.8 MIL/uL — ABNORMAL LOW (ref 3.87–5.11)
RDW: 14.3 % (ref 11.5–15.5)
WBC: 7.8 10*3/uL (ref 4.0–10.5)

## 2015-03-25 LAB — HEPARIN LEVEL (UNFRACTIONATED)
HEPARIN UNFRACTIONATED: 0.52 [IU]/mL (ref 0.30–0.70)
Heparin Unfractionated: 0.19 [IU]/mL — ABNORMAL LOW (ref 0.30–0.70)

## 2015-03-25 LAB — PROTIME-INR
INR: 1.3 (ref 0.00–1.49)
Prothrombin Time: 16.3 seconds — ABNORMAL HIGH (ref 11.6–15.2)

## 2015-03-25 MED ORDER — NYSTATIN 100000 UNIT/ML MT SUSP
5.0000 mL | Freq: Four times a day (QID) | OROMUCOSAL | Status: DC
  Administered 2015-03-25 – 2015-03-27 (×9): 500000 [IU] via ORAL
  Filled 2015-03-25 (×12): qty 5

## 2015-03-25 MED ORDER — WARFARIN SODIUM 5 MG PO TABS
10.0000 mg | ORAL_TABLET | Freq: Once | ORAL | Status: AC
Start: 2015-03-25 — End: 2015-03-25
  Administered 2015-03-25: 10 mg via ORAL
  Filled 2015-03-25: qty 2
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
## 2015-03-25 NOTE — Progress Notes (Signed)
Patient refuses to sit in the chair (with help of [employer] Lift), even after being aware of MD orders.. Patient NWB to LUE and bilateral LE. Patient educated about the side effects of staying in bed (ex. Bed sores). Will continue to monitor patient.
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
## 2015-03-26 ENCOUNTER — Encounter (HOSPITAL_COMMUNITY): Payer: Self-pay | Admitting: *Deleted

## 2015-03-26 LAB — CBC
HEMATOCRIT: 27.7 % — AB (ref 36.0–46.0)
Hemoglobin: 9.1 g/dL — ABNORMAL LOW (ref 12.0–15.0)
MCH: 32.2 pg (ref 26.0–34.0)
MCHC: 32.9 g/dL (ref 30.0–36.0)
MCV: 97.9 fL (ref 78.0–100.0)
PLATELETS: 400 10*3/uL (ref 150–400)
RBC: 2.83 MIL/uL — AB (ref 3.87–5.11)
RDW: 14.2 % (ref 11.5–15.5)
WBC: 9 10*3/uL (ref 4.0–10.5)

## 2015-03-26 LAB — HEPARIN LEVEL (UNFRACTIONATED)
HEPARIN UNFRACTIONATED: 0.22 [IU]/mL — AB (ref 0.30–0.70)
HEPARIN UNFRACTIONATED: 0.62 [IU]/mL (ref 0.30–0.70)

## 2015-03-26 LAB — PROTIME-INR
INR: 1.58 — ABNORMAL HIGH (ref 0.00–1.49)
PROTHROMBIN TIME: 18.9 s — AB (ref 11.6–15.2)

## 2015-03-26 MED ORDER — WARFARIN SODIUM 5 MG PO TABS
10.0000 mg | ORAL_TABLET | Freq: Once | ORAL | Status: AC
  Administered 2015-03-26: 10 mg via ORAL
  Filled 2015-03-26: qty 2

## 2015-03-26 MED ORDER — HEPARIN (PORCINE) IN NACL 100-0.45 UNIT/ML-% IJ SOLN
1350.0000 [IU]/h | INTRAMUSCULAR | Status: DC
  Administered 2015-03-26: 1500 [IU]/h via INTRAVENOUS
  Filled 2015-03-26 (×3): qty 250

## 2015-03-26 MED ORDER — CLONAZEPAM 1 MG PO TABS
1.0000 mg | ORAL_TABLET | Freq: Two times a day (BID) | ORAL | Status: DC
  Administered 2015-03-26 – 2015-03-27 (×3): 1 mg via ORAL
  Filled 2015-03-26 (×3): qty 1

## 2015-03-26 MED ORDER — FUROSEMIDE 20 MG PO TABS
20.0000 mg | ORAL_TABLET | Freq: Every day | ORAL | Status: DC
  Administered 2015-03-26 – 2015-03-27 (×2): 20 mg via ORAL
  Filled 2015-03-26 (×2): qty 1

## 2015-03-26 MED ORDER — TRAZODONE HCL 50 MG PO TABS
150.0000 mg | ORAL_TABLET | Freq: Every day | ORAL | Status: DC
  Administered 2015-03-26: 150 mg via ORAL
  Filled 2015-03-26 (×2): qty 1
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
## 2015-03-26 NOTE — Progress Notes (Signed)
Pt has been faxed out.  No bed offers at this time.  CSW will continue to follow.  Merlyn LotJenna Holoman, LCSWA Clinical Social Worker [phone]

## 2015-03-27 ENCOUNTER — Encounter (HOSPITAL_COMMUNITY): Payer: Self-pay | Admitting: Orthopedic Surgery

## 2015-03-27 DIAGNOSIS — D62 Acute posthemorrhagic anemia: Secondary | ICD-10-CM | POA: Diagnosis not present

## 2015-03-27 DIAGNOSIS — S2220XA Unspecified fracture of sternum, initial encounter for closed fracture: Secondary | ICD-10-CM | POA: Diagnosis present

## 2015-03-27 DIAGNOSIS — S62303A Unspecified fracture of third metacarpal bone, left hand, initial encounter for closed fracture: Secondary | ICD-10-CM | POA: Diagnosis present

## 2015-03-27 DIAGNOSIS — S2241XA Multiple fractures of ribs, right side, initial encounter for closed fracture: Secondary | ICD-10-CM | POA: Diagnosis present

## 2015-03-27 DIAGNOSIS — S62619A Displaced fracture of proximal phalanx of unspecified finger, initial encounter for closed fracture: Secondary | ICD-10-CM | POA: Diagnosis present

## 2015-03-27 DIAGNOSIS — S81812A Laceration without foreign body, left lower leg, initial encounter: Secondary | ICD-10-CM | POA: Diagnosis present

## 2015-03-27 DIAGNOSIS — B370 Candidal stomatitis: Secondary | ICD-10-CM | POA: Diagnosis not present

## 2015-03-27 DIAGNOSIS — S82201A Unspecified fracture of shaft of right tibia, initial encounter for closed fracture: Secondary | ICD-10-CM | POA: Diagnosis present

## 2015-03-27 DIAGNOSIS — S301XXA Contusion of abdominal wall, initial encounter: Secondary | ICD-10-CM | POA: Diagnosis present

## 2015-03-27 DIAGNOSIS — S82892A Other fracture of left lower leg, initial encounter for closed fracture: Secondary | ICD-10-CM | POA: Diagnosis present

## 2015-03-27 LAB — CBC
HEMATOCRIT: 26.9 % — AB (ref 36.0–46.0)
Hemoglobin: 8.3 g/dL — ABNORMAL LOW (ref 12.0–15.0)
MCH: 29.6 pg (ref 26.0–34.0)
MCHC: 30.9 g/dL (ref 30.0–36.0)
MCV: 96.1 fL (ref 78.0–100.0)
PLATELETS: 223 10*3/uL (ref 150–400)
RBC: 2.8 MIL/uL — ABNORMAL LOW (ref 3.87–5.11)
RDW: 14.3 % (ref 11.5–15.5)
WBC: 6.5 10*3/uL (ref 4.0–10.5)

## 2015-03-27 LAB — HEPARIN LEVEL (UNFRACTIONATED)
HEPARIN UNFRACTIONATED: 0.55 [IU]/mL (ref 0.30–0.70)
Heparin Unfractionated: 0.71 [IU]/mL — ABNORMAL HIGH (ref 0.30–0.70)

## 2015-03-27 LAB — PROTIME-INR
INR: 1.86 — ABNORMAL HIGH (ref 0.00–1.49)
Prothrombin Time: 21.4 s — ABNORMAL HIGH (ref 11.6–15.2)

## 2015-03-27 MED ORDER — WARFARIN SODIUM 5 MG PO TABS
10.0000 mg | ORAL_TABLET | Freq: Once | ORAL | Status: AC
  Administered 2015-03-27: 10 mg via ORAL
  Filled 2015-03-27: qty 2

## 2015-03-27 MED ORDER — NYSTATIN 100000 UNIT/ML MT SUSP: 5.0000 mL | Freq: Four times a day (QID) | OROMUCOSAL | Status: DC

## 2015-03-27 MED ORDER — ENOXAPARIN SODIUM 30 MG/0.3ML ~~LOC~~ SOLN: 80.0000 mg | Freq: Two times a day (BID) | SUBCUTANEOUS | Status: DC

## 2015-03-27 MED ORDER — OXYCODONE-ACETAMINOPHEN 7.5-325 MG PO TABS: 1.0000 | ORAL_TABLET | ORAL | Status: DC | PRN

## 2015-03-27 MED ORDER — CLONAZEPAM 1 MG PO TABS: 1.0000 mg | ORAL_TABLET | Freq: Two times a day (BID) | ORAL | Status: DC

## 2015-03-27 MED ORDER — MORPHINE SULFATE ER 15 MG PO TBCR
15.0000 mg | EXTENDED_RELEASE_TABLET | Freq: Two times a day (BID) | ORAL | Status: DC
Start: 2015-03-27 — End: 2016-04-24

## 2015-03-27 NOTE — Care Management Note (Signed)
Case Management Note  Patient Details  Name: Susan RathkeDonna K Howell MRN: [id number] Date of Birth: 03/21/1956  Subjective/Objective:                    Action/Plan:  Pt for dc to SNF today, per CSW arrangements.     Expected Discharge Date:       03/27/15           Expected Discharge Plan:  Skilled Nursing Facility  In-House Referral:  Clinical Social Work  Discharge planning Services  CM Consult  Post Acute Care Choice:    Choice offered to:     DME Arranged:    DME Agency:     HH Arranged:    HH Agency:     Status of Service:  Completed, signed off  Medicare Important Message Given:  No Date Medicare IM Given:    Medicare IM give by:    Date Additional Medicare IM Given:    Additional Medicare Important Message give by:     If discussed at Long Length of Stay Meetings, dates discussed:    Additional Comments:  Quintella BatonJulie W. Armiyah Capron, RN, BSN  Trauma/Neuro ICU Case Manager [phone]
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
## 2015-03-27 NOTE — Progress Notes (Signed)
Chaplain visited as a follow up. Pt's boyfriend who has been very supportive was present. He was very disappointed with the care communication in this stay. He spoke highly of some care practicners but was very disappointed overall of the care. Chaplain prayed for PT and family. Pt is transferred to a rehab facility.   03/27/15 1600  Clinical Encounter Type  Visited With Patient and family together  Visit Type Spiritual support  Referral From Patient  Spiritual Encounters  Spiritual Needs Prayer;Emotional
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
## 2015-03-30 ENCOUNTER — Encounter (HOSPITAL_COMMUNITY): Payer: Self-pay | Admitting: Orthopedic Surgery

## 2015-04-03 ENCOUNTER — Encounter (HOSPITAL_COMMUNITY): Payer: Self-pay | Admitting: Orthopedic Surgery

## 2015-04-04 ENCOUNTER — Encounter (HOSPITAL_COMMUNITY): Payer: Self-pay | Admitting: Orthopedic Surgery

## 2015-04-18 ENCOUNTER — Telehealth: Payer: Self-pay
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
## 2015-04-18 NOTE — Telephone Encounter (Signed)
Noted, will await call back after discharge from rehab.

## 2015-04-23 ENCOUNTER — Telehealth: Payer: Self-pay | Admitting: Cardiology

## 2015-04-23 ENCOUNTER — Encounter: Payer: Medicaid Other | Admitting: *Deleted

## 2015-04-23 NOTE — Telephone Encounter (Signed)
LMOVM reminding pt to send remote transmission.   

## 2015-04-24 ENCOUNTER — Encounter: Payer: Self-pay | Admitting: Cardiology

## 2015-05-09 ENCOUNTER — Ambulatory Visit (INDEPENDENT_AMBULATORY_CARE_PROVIDER_SITE_OTHER): Payer: Medicaid Other | Admitting: *Deleted

## 2015-05-09 DIAGNOSIS — Z5181 Encounter for therapeutic drug level monitoring: Secondary | ICD-10-CM | POA: Diagnosis not present

## 2015-05-09 LAB — POCT INR: INR: 4

## 2015-05-17 ENCOUNTER — Telehealth: Payer: Self-pay | Admitting: *Deleted

## 2015-05-17 NOTE — Telephone Encounter (Signed)
Pt calling stating she went to PCP yesterday and was put on Cymbalta Was told by her doctor to ask if pt needs re-check on coumadin for a check once patient starts that medication. Please advise.

## 2015-05-17 NOTE — Telephone Encounter (Signed)
Returned call to pt, advised Cymbalta can interact with Coumadin, will need to check INR soon after starting.  Pt states she will pick up rx today, and start taking tomorrow 05/18/15.  Pt already has scheduled INR check for next Wednesday 05/23/15, will keep scheduled appt.

## 2015-05-23 ENCOUNTER — Ambulatory Visit (INDEPENDENT_AMBULATORY_CARE_PROVIDER_SITE_OTHER): Payer: Medicaid Other

## 2015-05-23 DIAGNOSIS — Z5181 Encounter for therapeutic drug level monitoring: Secondary | ICD-10-CM

## 2015-05-23 LAB — POCT INR: INR: 4

## 2015-06-06 ENCOUNTER — Ambulatory Visit: Payer: Medicare Other

## 2015-06-06 ENCOUNTER — Ambulatory Visit (INDEPENDENT_AMBULATORY_CARE_PROVIDER_SITE_OTHER): Payer: Medicare Other | Admitting: *Deleted

## 2015-06-06 ENCOUNTER — Other Ambulatory Visit: Payer: Self-pay | Admitting: *Deleted

## 2015-06-06 DIAGNOSIS — R262 Difficulty in walking, not elsewhere classified: Secondary | ICD-10-CM | POA: Insufficient documentation

## 2015-06-06 DIAGNOSIS — I2699 Other pulmonary embolism without acute cor pulmonale: Secondary | ICD-10-CM

## 2015-06-06 DIAGNOSIS — Z967 Presence of other bone and tendon implants: Secondary | ICD-10-CM | POA: Insufficient documentation

## 2015-06-06 DIAGNOSIS — Z86711 Personal history of pulmonary embolism: Secondary | ICD-10-CM

## 2015-06-06 DIAGNOSIS — Z8781 Personal history of (healed) traumatic fracture: Secondary | ICD-10-CM | POA: Insufficient documentation

## 2015-06-06 DIAGNOSIS — Z7901 Long term (current) use of anticoagulants: Secondary | ICD-10-CM

## 2015-06-06 DIAGNOSIS — R531 Weakness: Secondary | ICD-10-CM | POA: Insufficient documentation

## 2015-06-06 DIAGNOSIS — Z5181 Encounter for therapeutic drug level monitoring: Secondary | ICD-10-CM

## 2015-06-06 DIAGNOSIS — X58XXXD Exposure to other specified factors, subsequent encounter: Secondary | ICD-10-CM | POA: Insufficient documentation

## 2015-06-06 DIAGNOSIS — S82141D Displaced bicondylar fracture of right tibia, subsequent encounter for closed fracture with routine healing: Secondary | ICD-10-CM | POA: Insufficient documentation

## 2015-06-06 DIAGNOSIS — Z4789 Encounter for other orthopedic aftercare: Secondary | ICD-10-CM | POA: Insufficient documentation

## 2015-06-06 LAB — POCT INR: INR: 6.7
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
## 2015-06-07 ENCOUNTER — Other Ambulatory Visit: Payer: Medicaid Other

## 2015-06-07 ENCOUNTER — Other Ambulatory Visit
Admission: RE | Admit: 2015-06-07 | Discharge: 2015-06-07 | Disposition: A | Discharge status: Home / Self Care | Payer: Medicaid Other | Source: Ambulatory Visit | Attending: Cardiovascular Disease | Admitting: Cardiovascular Disease

## 2015-06-07 DIAGNOSIS — Z7901 Long term (current) use of anticoagulants: Secondary | ICD-10-CM | POA: Diagnosis not present

## 2015-06-07 LAB — PROTIME-INR
INR: 3.41
Prothrombin Time: 34.4 seconds — ABNORMAL HIGH (ref 11.4–15.0)

## 2015-06-13 ENCOUNTER — Ambulatory Visit (INDEPENDENT_AMBULATORY_CARE_PROVIDER_SITE_OTHER): Payer: Medicare Other

## 2015-06-13 DIAGNOSIS — Z86711 Personal history of pulmonary embolism: Secondary | ICD-10-CM

## 2015-06-13 DIAGNOSIS — Z5181 Encounter for therapeutic drug level monitoring: Secondary | ICD-10-CM

## 2015-06-13 LAB — POCT INR: INR: 3.4

## 2015-06-21 ENCOUNTER — Ambulatory Visit: Payer: Medicare Other | Attending: Orthopedic Surgery

## 2015-06-21 DIAGNOSIS — Z967 Presence of other bone and tendon implants: Secondary | ICD-10-CM | POA: Diagnosis not present

## 2015-06-21 DIAGNOSIS — R531 Weakness: Secondary | ICD-10-CM

## 2015-06-21 DIAGNOSIS — X58XXXD Exposure to other specified factors, subsequent encounter: Secondary | ICD-10-CM | POA: Diagnosis not present

## 2015-06-21 DIAGNOSIS — Z4789 Encounter for other orthopedic aftercare: Secondary | ICD-10-CM

## 2015-06-21 DIAGNOSIS — Z9889 Other specified postprocedural states: Secondary | ICD-10-CM

## 2015-06-21 DIAGNOSIS — R262 Difficulty in walking, not elsewhere classified: Secondary | ICD-10-CM | POA: Diagnosis present

## 2015-06-21 DIAGNOSIS — S82141D Displaced bicondylar fracture of right tibia, subsequent encounter for closed fracture with routine healing: Secondary | ICD-10-CM | POA: Diagnosis present

## 2015-06-21 DIAGNOSIS — Z8781 Personal history of (healed) traumatic fracture: Secondary | ICD-10-CM | POA: Diagnosis present

## 2015-06-21 NOTE — Patient Instructions (Addendum)
Pain free range.  Ankle Bend: Dorsiflexion / Plantar Flexion, Sitting   Sit with feet on floor. Point toes up, keeping both heels on floor. Then press toes to floor raising heels. Hold each position _2__ seconds. Repeat __10_ times per session. Do _3__ sessions per day.  Copyright  VHI. All rights reserved.   Toe Flexors   Activity: Curl toes to gather towel placed on floor. Use slow and controlled movements.*  Copyright  VHI. All rights reserved.  Ankle Dorsiflexion, Self-Mobilization, Sitting   Sit with feet flat. Slide one foot back until gentle stretch is felt. Keep entire foot on floor. Hold _5__ seconds. Repeat __10_ times per session. Do 3___ sessions per day.  Copyright  VHI. All rights reserved.    On your back with leg propped, gently massage leg, then ankle, then foot to help decrease swelling  On your back, with leg propped, gently massage around leg to gently promote movement of skin, especially around scar tissue.     Improved exercise technique, movement at target joints, use of target muscles after mod verbal, visual, tactile cues.
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
## 2015-06-27 ENCOUNTER — Ambulatory Visit (INDEPENDENT_AMBULATORY_CARE_PROVIDER_SITE_OTHER): Payer: Medicare Other

## 2015-06-27 DIAGNOSIS — Z86711 Personal history of pulmonary embolism: Secondary | ICD-10-CM | POA: Diagnosis not present

## 2015-06-27 DIAGNOSIS — Z5181 Encounter for therapeutic drug level monitoring: Secondary | ICD-10-CM

## 2015-06-27 LAB — POCT INR: INR: 2.7

## 2015-07-10 ENCOUNTER — Emergency Department (HOSPITAL_COMMUNITY): Payer: Medicare Other

## 2015-07-10 ENCOUNTER — Encounter (HOSPITAL_COMMUNITY): Payer: Self-pay | Admitting: Emergency Medicine

## 2015-07-10 ENCOUNTER — Emergency Department (HOSPITAL_COMMUNITY)
Admission: EM | Admit: 2015-07-10 | Discharge: 2015-07-10 | Disposition: A | Discharge status: Home / Self Care | Payer: Medicare Other | Attending: Emergency Medicine | Admitting: Emergency Medicine

## 2015-07-10 DIAGNOSIS — I10 Essential (primary) hypertension: Secondary | ICD-10-CM | POA: Insufficient documentation

## 2015-07-10 DIAGNOSIS — Z950 Presence of cardiac pacemaker: Secondary | ICD-10-CM | POA: Diagnosis not present

## 2015-07-10 DIAGNOSIS — F329 Major depressive disorder, single episode, unspecified: Secondary | ICD-10-CM | POA: Diagnosis not present

## 2015-07-10 DIAGNOSIS — Z7901 Long term (current) use of anticoagulants: Secondary | ICD-10-CM | POA: Diagnosis not present

## 2015-07-10 DIAGNOSIS — F419 Anxiety disorder, unspecified: Secondary | ICD-10-CM | POA: Diagnosis not present

## 2015-07-10 DIAGNOSIS — H8110 Benign paroxysmal vertigo, unspecified ear: Secondary | ICD-10-CM | POA: Diagnosis not present

## 2015-07-10 DIAGNOSIS — Z880 Allergy status to penicillin: Secondary | ICD-10-CM | POA: Diagnosis not present

## 2015-07-10 DIAGNOSIS — Z8739 Personal history of other diseases of the musculoskeletal system and connective tissue: Secondary | ICD-10-CM | POA: Insufficient documentation

## 2015-07-10 DIAGNOSIS — R42 Dizziness and giddiness: Secondary | ICD-10-CM | POA: Diagnosis present

## 2015-07-10 DIAGNOSIS — Z79899 Other long term (current) drug therapy: Secondary | ICD-10-CM | POA: Diagnosis not present

## 2015-07-10 DIAGNOSIS — Z86711 Personal history of pulmonary embolism: Secondary | ICD-10-CM | POA: Insufficient documentation

## 2015-07-10 DIAGNOSIS — Z87448 Personal history of other diseases of urinary system: Secondary | ICD-10-CM | POA: Diagnosis not present

## 2015-07-10 DIAGNOSIS — E669 Obesity, unspecified: Secondary | ICD-10-CM | POA: Diagnosis not present

## 2015-07-10 LAB — CBC
HCT: 41.4 % (ref 36.0–46.0)
Hemoglobin: 13.3 g/dL (ref 12.0–15.0)
MCH: 30 pg (ref 26.0–34.0)
MCHC: 32.1 g/dL (ref 30.0–36.0)
MCV: 93.5 fL (ref 78.0–100.0)
Platelets: 346 10*3/uL (ref 150–400)
RBC: 4.43 MIL/uL (ref 3.87–5.11)
RDW: 16.6 % — ABNORMAL HIGH (ref 11.5–15.5)
WBC: 6.1 10*3/uL (ref 4.0–10.5)

## 2015-07-10 LAB — URINALYSIS, ROUTINE W REFLEX MICROSCOPIC
Bilirubin Urine: NEGATIVE
Glucose, UA: NEGATIVE mg/dL
Hgb urine dipstick: NEGATIVE
Ketones, ur: 15 mg/dL — AB
Leukocytes, UA: NEGATIVE
Nitrite: NEGATIVE
Protein, ur: NEGATIVE mg/dL
Specific Gravity, Urine: 1.014 (ref 1.005–1.030)
Urobilinogen, UA: 0.2 mg/dL (ref 0.0–1.0)
pH: 7 (ref 5.0–8.0)

## 2015-07-10 LAB — BASIC METABOLIC PANEL
Anion gap: 7 (ref 5–15)
BUN: 8 mg/dL (ref 6–20)
CO2: 26 mmol/L (ref 22–32)
Calcium: 8.8 mg/dL — ABNORMAL LOW (ref 8.9–10.3)
Chloride: 106 mmol/L (ref 101–111)
Creatinine, Ser: 0.66 mg/dL (ref 0.44–1.00)
GFR calc Af Amer: >60 mL/min (ref >60)
GFR calc non Af Amer: >60 mL/min (ref >60)
Glucose, Bld: 93 mg/dL (ref 65–99)
Potassium: 4.2 mmol/L (ref 3.5–5.1)
Sodium: 139 mmol/L (ref 135–145)

## 2015-07-10 LAB — I-STAT TROPONIN, ED: Troponin i, poc: 0 ng/mL (ref 0.00–0.08)

## 2015-07-10 LAB — BRAIN NATRIURETIC PEPTIDE: B Natriuretic Peptide: 98 pg/mL (ref 0.0–100.0)

## 2015-07-10 MED ORDER — ONDANSETRON HCL 4 MG/2ML IJ SOLN
4.0000 mg | Freq: Once | INTRAMUSCULAR | Status: AC
  Administered 2015-07-10: 4 mg via INTRAVENOUS
  Filled 2015-07-10: qty 2

## 2015-07-10 MED ORDER — SODIUM CHLORIDE 0.9 % IV BOLUS (SEPSIS)
500.0000 mL | Freq: Once | INTRAVENOUS | Status: AC
  Administered 2015-07-10: 500 mL via INTRAVENOUS

## 2015-07-10 MED ORDER — FENTANYL CITRATE (PF) 100 MCG/2ML IJ SOLN
50.0000 ug | Freq: Once | INTRAMUSCULAR | Status: AC
  Administered 2015-07-10: 50 ug via INTRAVENOUS
  Filled 2015-07-10: qty 2

## 2015-07-10 MED ORDER — OXYCODONE HCL 5 MG PO TABS
5.0000 mg | ORAL_TABLET | Freq: Once | ORAL | Status: AC
  Administered 2015-07-10: 5 mg via ORAL
  Filled 2015-07-10: qty 1

## 2015-07-10 MED ORDER — OXYCODONE-ACETAMINOPHEN 5-325 MG PO TABS
2.0000 | ORAL_TABLET | Freq: Once | ORAL | Status: DC
  Filled 2015-07-10: qty 2

## 2015-07-10 NOTE — ED Notes (Addendum)
Pt refused percocet d/t tylenol. Pt states she'd rather wait until after she is discharged and take her medicine at home.

## 2015-07-10 NOTE — Discharge Instructions (Signed)
Benign Positional Vertigo °Vertigo means you feel like you or your surroundings are moving when they are not. Benign positional vertigo is the most common form of vertigo. Benign means that the cause of your condition is not serious. Benign positional vertigo is more common in older adults. °CAUSES  °Benign positional vertigo is the result of an upset in the labyrinth system. This is an area in the middle ear that helps control your balance. This may be caused by a viral infection, head injury, or repetitive motion. However, often no specific cause is found. °SYMPTOMS  °Symptoms of benign positional vertigo occur when you move your head or eyes in different directions. Some of the symptoms may include: °1. Loss of balance and falls. °2. Vomiting. °3. Blurred vision. °4. Dizziness. °5. Nausea. °6. Involuntary eye movements (nystagmus). °DIAGNOSIS  °Benign positional vertigo is usually diagnosed by physical exam. If the specific cause of your benign positional vertigo is unknown, your caregiver may perform imaging tests, such as magnetic resonance imaging (MRI) or computed tomography (CT). °TREATMENT  °Your caregiver may recommend movements or procedures to correct the benign positional vertigo. Medicines such as meclizine, benzodiazepines, and medicines for nausea may be used to treat your symptoms. In rare cases, if your symptoms are caused by certain conditions that affect the inner ear, you may need surgery. °HOME CARE INSTRUCTIONS  °· Follow your caregiver's instructions. °· Move slowly. Do not make sudden body or head movements. °· Avoid driving. °· Avoid operating heavy machinery. °· Avoid performing any tasks that would be dangerous to you or others during a vertigo episode. °· Drink enough fluids to keep your urine clear or pale yellow. °SEEK IMMEDIATE MEDICAL CARE IF:  °· You develop problems with walking, weakness, numbness, or using your arms, hands, or legs. °· You have difficulty speaking. °· You develop  severe headaches. °· Your nausea or vomiting continues or gets worse. °· You develop visual changes. °· Your family or friends notice any behavioral changes. °· Your condition gets worse. °· You have a fever. °· You develop a stiff neck or sensitivity to light. °MAKE SURE YOU:  °· Understand these instructions. °· Will watch your condition. °· Will get help right away if you are not doing well or get worse. °Document Released: 07/21/2006 Document Revised: 01/05/2012 Document Reviewed: 07/03/2011 °ExitCare® Patient Information ©2015 [employer]. This information is not intended to replace advice given to you by your health care provider. Make sure you discuss any questions you have with your health care provider. ° °Epley Maneuver Self-Care °WHAT IS THE EPLEY MANEUVER? °The Epley maneuver is an exercise you can do to relieve symptoms of benign paroxysmal positional vertigo (BPPV). This condition is often just referred to as vertigo. BPPV is caused by the movement of tiny crystals (canaliths) inside your inner ear. The accumulation and movement of canaliths in your inner ear causes a sudden spinning sensation (vertigo) when you move your head to certain positions. Vertigo usually lasts about 30 seconds. BPPV usually occurs in just one ear. If you get vertigo when you lie on your left side, you probably have BPPV in your left ear. Your health care provider can tell you which ear is involved.  °BPPV may be caused by a head injury. Many people older than 50 get BPPV for unknown reasons. If you have been diagnosed with BPPV, your health care provider may teach you how to do this maneuver. BPPV is not life threatening (benign) and usually goes away in time.  °  WHEN SHOULD I PERFORM THE EPLEY MANEUVER? °You can do this maneuver at home whenever you have symptoms of vertigo. You may do the Epley maneuver up to 3 times a day until your symptoms of vertigo go away. °HOW SHOULD I DO THE EPLEY MANEUVER? °7. Sit on the edge of a  bed or table with your back straight. Your legs should be extended or hanging over the edge of the bed or table.   °8. Turn your head halfway toward the affected ear.   °9. Lie backward quickly with your head turned until you are lying flat on your back. You may want to position a pillow under your shoulders.   °10. Hold this position for 30 seconds. You may experience an attack of vertigo. This is normal. Hold this position until the vertigo stops. °11. Then turn your head to the opposite direction until your unaffected ear is facing the floor.   °12. Hold this position for 30 seconds. You may experience an attack of vertigo. This is normal. Hold this position until the vertigo stops. °13. Now turn your whole body to the same side as your head. Hold for another 30 seconds.   °14. You can then sit back up. °ARE THERE RISKS TO THIS MANEUVER? °In some cases, you may have other symptoms (such as changes in your vision, weakness, or numbness). If you have these symptoms, stop doing the maneuver and call your health care provider. Even if doing these maneuvers relieves your vertigo, you may still have dizziness. Dizziness is the sensation of light-headedness but without the sensation of movement. Even though the Epley maneuver may relieve your vertigo, it is possible that your symptoms will return within 5 years. °WHAT SHOULD I DO AFTER THIS MANEUVER? °After doing the Epley maneuver, you can return to your normal activities. Ask your doctor if there is anything you should do at home to prevent vertigo. This may include: °· Sleeping with two or more pillows to keep your head elevated. °· Not sleeping on the side of your affected ear. °· Getting up slowly from bed. °· Avoiding sudden movements during the day. °· Avoiding extreme head movement, like looking up or bending over. °· Wearing a cervical collar to prevent sudden head movements. °WHAT SHOULD I DO IF MY SYMPTOMS GET WORSE? °Call your health care provider if your  vertigo gets worse. Call your provider right way if you have other symptoms, including:  °· Nausea. °· Vomiting. °· Headache. °· Weakness. °· Numbness. °· Vision changes. °Document Released: 10/18/2013 Document Reviewed: 10/18/2013 °ExitCare® Patient Information ©2015 [employer]. This information is not intended to replace advice given to you by your health care provider. Make sure you discuss any questions you have with your health care provider. ° °

## 2015-07-10 NOTE — ED Provider Notes (Signed)
CSN: [id number]     Arrival date & time 07/10/15  1120 History   First MD Initiated Contact with Patient 07/10/15 1509     Chief Complaint  Patient presents with  . Dizziness     (Consider location/radiation/quality/duration/timing/severity/associated sxs/prior Treatment) HPI   Patient is a 59 year old female presents to the ER with dizziness that occurs when standing up or when looking upwards.  Today she experienced a little bit of lightheadedness when she got out of bed and walked around her home, she bent over to clean up something on the floor and when she stood up she became dizzy and nauseous and she fell over onto her left side onto the television.  The dizziness lasted approximately 3 minutes.  She has had BPPV before and this felt similar, however it lasted much longer than her other episodes. She has a prescription of meclizine at home however instead of taking it decided to come to the ER for evaluation. She denies any  head trauma, loss of consciousness, visual changes, numbness, weakness, vomiting, diarrhea, abdominal pain, chest pain or shortness of breath.  She states over the past 2 days she has had increased anxiety and decreased energy. She denies any cough, cold, fever, chills, sweats, or any other complaints at this time.  She reports autonomic dysregulation which causes orthostatic hypotension, she has a history of PEs and is anticoagulated on Coumadin, she has a pacemaker and is on Lopressor.  Past Medical History  Diagnosis Date  . Anxiety   . Depression   . Obesity   . HTN (hypertension)   . Obesity   . Cystitis   . Palpitations   . Lung nodules   . History of urinary tract infection   . Pulmonary embolism     a. 2/2 gastric bypass, 1979. b. 12/12/2012  . H/O diastolic dysfunction     a. Echo 12/13/12: EF 65-70%, G2DD, pulm press . b. Echo 02/03/13: 50-55%, no WMA, lef ventricular diastolic function nl, pulm arteries w/in nl range, mild LA dilation.    . Sleep  apnea     a. no cpap now uses 02 2L at night  . Pacemaker 2009    a. Dr. Ladona Ridgel. b. Medtronic, SN# [id number], est longevity 10.5 years  . Sinoatrial node dysfunction 2009  . Headache(784.0)   . Motility disorder, esophageal   . Arthritis   . Fibromyalgia   . Thyroid nodule   . Acute stress reaction     Husband passed 2/2 car falling on him that he was working on 2014   . Orthostatic hypotension    Past Surgical History  Procedure Laterality Date  . Pacemaker placement      x2, pacemaker leads replaced  . Appendectomy    . Cholecystectomy    . 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 Gastric bypass    . Stomach surgery      "2/3 of stomach removed"  . Abdominal hysterectomy    . Ectopic pregnancy surgery    . Foot surgery      both feet, and ankles  . Knee surgery Bilateral   . Rib resection    . Abdominal surgery      muscle deteriorated in abdomen  . Eus N/A 03/03/2013    Procedure: UPPER ENDOSCOPIC ULTRASOUND (EUS) LINEAR;  Surgeon: Rachael Fee, MD;  Location: [facility];  Service: Endoscopy;  Laterality: N/A;  . Laminectomy    .  Orif ankle fracture Left 03/22/2015    Procedure: OPEN REDUCTION INTERNAL FIXATION (ORIF) BIMAL ANKLE FRACTURE;  Surgeon: Sheral Apley, MD;  Location: [facility];  Service: Orthopedics;  Laterality: Left;  . Open reduction internal fixation (orif) metacarpal Left 03/22/2015    Procedure: OPEN REDUCTION INTERNAL FIXATION (ORIF) LEFT SMALL FINGER;  Surgeon: Knute Neu, MD;  Location: [facility];  Service: Orthopedics;  Laterality: Left;   Family History  Problem Relation Age of Onset  . Colon cancer Neg Hx   . Throat cancer Neg Hx   . Esophageal cancer Neg Hx   . Stomach cancer Neg Hx   . Liver disease Father     PBC  . Kidney disease Neg Hx   . Diabetes Brother     twin  . Heart attack Brother    Social History  Substance Use Topics  . Smoking status: Never Smoker   . Smokeless  tobacco: Never Used  . Alcohol Use: No   OB History    No data available     Review of Systems  Constitutional: Negative for fever, chills, diaphoresis, activity change, appetite change and fatigue.  HENT: Negative.   Eyes: Negative for photophobia, redness and visual disturbance.  Respiratory: Negative for cough, chest tightness, shortness of breath, wheezing and stridor.   Cardiovascular: Positive for leg swelling. Negative for chest pain and palpitations.       Chronic LE edema  Gastrointestinal: Positive for nausea. Negative for vomiting, abdominal pain, diarrhea, constipation and blood in stool.  Musculoskeletal: Positive for back pain.  Skin: Negative.   Neurological: Positive for dizziness and light-headedness. Negative for tremors, seizures, syncope, facial asymmetry, speech difficulty, weakness, numbness and headaches.  Psychiatric/Behavioral: The patient is nervous/anxious.       Allergies  Iodine; Labetalol hcl; Bactrim; Eszopiclone; Iohexol; Penicillins; Smallpox vaccine; Tetanus toxoids; Topamax; Vancomycin; Albuterol; Nitrofuran derivatives; Aliskiren fumarate; and Codeine  Home Medications   Prior to Admission medications   Medication Sig Start Date End Date Taking? Authorizing Provider  clonazePAM (KLONOPIN) 1 MG tablet Take 1 tablet (1 mg total) by mouth 2 (two) times daily. 03/27/15  Yes Freeman Caldron, PA-C  cyanocobalamin 1000 MCG tablet Take 1,000 mcg by mouth daily.    Yes Historical Provider, MD  docusate sodium (COLACE) 50 MG capsule Take 100 mg by mouth daily as needed for mild constipation or moderate constipation.    Yes Historical Provider, MD  fluticasone (FLONASE) 50 MCG/ACT nasal spray Place 2 sprays into both nostrils daily as needed for allergies or rhinitis.    Yes Historical Provider, MD  furosemide (LASIX) 20 MG tablet Take 20 mg by mouth daily as needed for fluid.  02/06/15  Yes Historical Provider, MD  metoprolol (LOPRESSOR) 50 MG tablet Take  50 mg by mouth 2 (two) times daily.    Yes Historical Provider, MD  morphine (MS CONTIN) 15 MG 12 hr tablet Take 1 tablet (15 mg total) by mouth every 12 (twelve) hours. 03/27/15  Yes Freeman Caldron, PA-C  nystatin (MYCOSTATIN) 100000 UNIT/ML suspension Take 5 mLs (500,000 Units total) by mouth 4 (four) times daily. 03/27/15  Yes Freeman Caldron, PA-C  ondansetron (ZOFRAN) 4 MG tablet Take 1 tablet (4 mg total) by mouth every 8 (eight) hours as needed for nausea or vomiting. 03/22/15  Yes Brittney Tresa Endo, PA-C  oxyCODONE-acetaminophen (PERCOCET) 7.5-325 MG per tablet Take 1-2 tablets by mouth every 4 (four) hours as needed. 03/27/15  Yes Freeman Caldron, PA-C  traZODone (DESYREL)  150 MG tablet Take 150 mg by mouth at bedtime.   Yes Historical Provider, MD  warfarin (COUMADIN) 5 MG tablet Take 5 mg by mouth daily.   Yes Historical Provider, MD  enoxaparin (LOVENOX) 30 MG/0.3ML injection Inject 0.8 mLs (80 mg total) into the skin every 12 (twelve) hours. Patient not taking: Reported on 07/10/2015 03/27/15   Freeman Caldron, PA-C  warfarin (COUMADIN) 5 MG tablet Take as directed by coumadin clinic Patient not taking: Reported on 07/10/2015 03/07/15   Duke Salvia, MD   BP 135/73 mmHg  Pulse 57  Temp(Src) 97.8 F (36.6 C) (Oral)  Resp 16  Ht 5' (1.524 m)  Wt 173 lb (78.472 kg)  BMI 33.79 kg/m2  SpO2 97% Physical Exam  Constitutional: She is oriented to person, place, and time. She appears well-developed and well-nourished. No distress.  HENT:  Head: Normocephalic and atraumatic.  Nose: Nose normal.  Mouth/Throat: Oropharynx is clear and moist. No oropharyngeal exudate.  Eyes: Conjunctivae and EOM are normal. Pupils are equal, round, and reactive to light. Right eye exhibits no discharge. Left eye exhibits no discharge. No scleral icterus.  No nystagmus  Neck: Normal range of motion. No JVD present. No tracheal deviation present. No thyromegaly present.  Cardiovascular: Normal rate,  regular rhythm, normal heart sounds and intact distal pulses.  Exam reveals no gallop and no friction rub.   No murmur heard. Symmetrical pulses radial 2+, dorsal pedis 2+, 1+ pitting edema left lower extremity, no pitting edema right lower extremity  Pulmonary/Chest: Effort normal and breath sounds normal. No respiratory distress. She has no wheezes. She has no rales. She exhibits no tenderness.  Clear to auscultation A&P, kyphosis  Abdominal: Soft. Bowel sounds are normal. She exhibits no distension and no mass. There is no tenderness. There is no rebound and no guarding.  Lymphadenopathy:    She has no cervical adenopathy.  Neurological: She is alert and oriented to person, place, and time. She has normal reflexes. No cranial nerve deficit. She exhibits normal muscle tone. Coordination normal.  Speech is clear and goal oriented, follows commands Major Cranial nerves without deficit, no facial droop Normal strength in upper and lower extremities bilaterally including dorsiflexion and plantar flexion, strong and equal grip strength Sensation normal to light and sharp touch Moves extremities without ataxia, coordination intact Normal finger to nose and rapid alternating movements Neg romberg, no pronator drift Normal gait and balance   Skin: Skin is warm and dry. No rash noted. She is not diaphoretic. No erythema. No pallor.  Psychiatric: She has a normal mood and affect. Her behavior is normal. Judgment and thought content normal.  Nursing note and vitals reviewed.   ED Course  Procedures (including critical care time) Labs Review Labs Reviewed  BASIC METABOLIC PANEL - Abnormal; Notable for the following:    Calcium 8.8 (*)    All other components within normal limits  CBC - Abnormal; Notable for the following:    RDW 16.6 (*)    All other components within normal limits  URINALYSIS, ROUTINE W REFLEX MICROSCOPIC (NOT AT [facility])  BRAIN NATRIURETIC PEPTIDE  Rosezena Sensor, ED     Imaging Review Dg Chest 2 View  07/10/2015   CLINICAL DATA:  Shortness of Breath.  Dizziness, fall.  EXAM: CHEST  2 VIEW  COMPARISON:  03/20/2015  FINDINGS: Left pacer remains in place, unchanged. Heart is upper limits normal in size. No confluent airspace opacities or effusions.  Posterior fusion changes in the thoracolumbar  spine across a moderate to severe upper lumbar compression fracture, stable  IMPRESSION: No active disease.   Electronically Signed   By: Charlett Nose M.D.   On: 07/10/2015 17:08   I have personally reviewed and evaluated these images and lab results as part of my medical decision-making.   EKG Interpretation None      MDM   Final diagnoses:  BPPV (benign paroxysmal positional vertigo), unspecified laterality    Patient with dizziness complaint, reproducible with positional changes, going from laying to sitting and with in upper gaze. No nystagmus observed with Dix-Hallpike maneuver.  Normal neurological exam, no deficits.  Patient is not orthostatic. Patient had a negative troponin, negative chest x-ray, chemistries and blood count unremarkable  Pt to DC home, has meclizine for vertigo.  Recommended to see PCP for BPPV/vertigo, ENT referral given.     Danelle Berry, PA-C 07/14/15 1610  Raeford Razor, MD 07/27/15 [phone]

## 2015-07-10 NOTE — ED Notes (Signed)
Pt sts dizziness with fall today; pt sts pain on left side after fall; pt c/o SOB and palpitations; pt sts hx of same and feels same

## 2015-07-11 ENCOUNTER — Encounter (HOSPITAL_BASED_OUTPATIENT_CLINIC_OR_DEPARTMENT_OTHER): Payer: Medicaid Other

## 2015-07-19 ENCOUNTER — Other Ambulatory Visit: Payer: Self-pay | Admitting: Internal Medicine

## 2015-07-25 ENCOUNTER — Ambulatory Visit (INDEPENDENT_AMBULATORY_CARE_PROVIDER_SITE_OTHER): Payer: Medicare Other

## 2015-07-25 DIAGNOSIS — Z5181 Encounter for therapeutic drug level monitoring: Secondary | ICD-10-CM | POA: Diagnosis not present

## 2015-07-25 DIAGNOSIS — Z86711 Personal history of pulmonary embolism: Secondary | ICD-10-CM | POA: Diagnosis not present

## 2015-07-25 LAB — POCT INR: INR: 1.5

## 2015-08-07 ENCOUNTER — Ambulatory Visit (INDEPENDENT_AMBULATORY_CARE_PROVIDER_SITE_OTHER): Payer: Medicare Other | Admitting: Pharmacist

## 2015-08-07 DIAGNOSIS — Z5181 Encounter for therapeutic drug level monitoring: Secondary | ICD-10-CM

## 2015-08-07 DIAGNOSIS — Z86711 Personal history of pulmonary embolism: Secondary | ICD-10-CM

## 2015-08-07 LAB — POCT INR: INR: 1.9

## 2015-08-13 ENCOUNTER — Telehealth: Payer: Self-pay | Admitting: *Deleted

## 2015-08-13 NOTE — Telephone Encounter (Signed)
Pt calling stating she needs to hold her coumadin for Endoscopy  Dr Jake SharkGrinn at [facility] will be doing this.  Pt is calling for she would need to be on Lovenox. Last time she was on 10 days before and after. If we decide to then please call it in to [facility] on [address].  Procedure is on Monday nov 21 st.  Please let pt know.  Please advise.

## 2015-08-14 NOTE — Telephone Encounter (Signed)
Will forward to Dr Mariah MillingGollan for clearance to hold Coumadin.  Please advise if pt ok to hold Coumadin for endoscopy and if she will need Lovenox bridging.  Thanks

## 2015-08-15 NOTE — Telephone Encounter (Signed)
I don't believe I have ever seen her in clinic before Will forward to Dr. Graciela HusbandsKlein

## 2015-08-21 NOTE — Telephone Encounter (Signed)
she has a history of recurrent DVT. Coverage with heparin/low molecular weight heparin is appropriate. Thanks

## 2015-08-22 ENCOUNTER — Ambulatory Visit (INDEPENDENT_AMBULATORY_CARE_PROVIDER_SITE_OTHER): Payer: Medicare Other

## 2015-08-22 DIAGNOSIS — Z86711 Personal history of pulmonary embolism: Secondary | ICD-10-CM | POA: Diagnosis not present

## 2015-08-22 DIAGNOSIS — Z5181 Encounter for therapeutic drug level monitoring: Secondary | ICD-10-CM | POA: Diagnosis not present

## 2015-08-22 LAB — POCT INR: INR: 2.5

## 2015-09-03 ENCOUNTER — Encounter: Payer: Self-pay | Admitting: Internal Medicine

## 2015-09-12 ENCOUNTER — Ambulatory Visit (INDEPENDENT_AMBULATORY_CARE_PROVIDER_SITE_OTHER): Payer: Medicare Other

## 2015-09-12 DIAGNOSIS — Z5181 Encounter for therapeutic drug level monitoring: Secondary | ICD-10-CM | POA: Diagnosis not present

## 2015-09-12 DIAGNOSIS — Z86711 Personal history of pulmonary embolism: Secondary | ICD-10-CM

## 2015-09-12 LAB — POCT INR: INR: 2.7

## 2015-09-12 MED ORDER — ENOXAPARIN SODIUM 80 MG/0.8ML ~~LOC~~ SOLN: 80.0000 mg | Freq: Two times a day (BID) | SUBCUTANEOUS | Status: DC

## 2015-09-12 NOTE — Patient Instructions (Addendum)
09/12/15 No Coumadin, No Lovenox. 09/13/15 Start taking Lovenox 80mg  once in the am, and Lovenox 80mg  once in the pm.  09/14/15 Take Lovenox 80mg  once in the am, and Lovenox 80mg  once in the pm. 09/15/15 Take Lovenox 80mg  once in the am, and Lovenox 80mg  once in the pm.  09/16/15 Take your last dosage of Lovenox 80mg  once in the am, No Lovenox in the pm prior to your procedure on 09/17/15. 09/17/15 No Lovenox.  After your procedure once MD states ok to resume Coumadin take an extra 1/2 tablet for 2 days, then resume previous dosage regimen 1 tablet daily except 1.5 tablets on Mondays.  Resume Lovenox 80mg  twice daily (every 12 hrs) once MD states ok to do so, continue taking until INR rechecked and equal to or greater than 2.0.

## 2015-09-18 ENCOUNTER — Other Ambulatory Visit: Payer: Self-pay

## 2015-09-24 ENCOUNTER — Ambulatory Visit (INDEPENDENT_AMBULATORY_CARE_PROVIDER_SITE_OTHER): Payer: Medicare Other

## 2015-09-24 ENCOUNTER — Other Ambulatory Visit: Payer: Self-pay

## 2015-09-24 DIAGNOSIS — Z5181 Encounter for therapeutic drug level monitoring: Secondary | ICD-10-CM

## 2015-09-24 LAB — POCT INR
INR: 1.6
INR: 1.6

## 2015-09-24 MED ORDER — ENOXAPARIN SODIUM 80 MG/0.8ML ~~LOC~~ SOLN: 80.0000 mg | Freq: Two times a day (BID) | SUBCUTANEOUS | Status: DC

## 2015-09-26 ENCOUNTER — Ambulatory Visit (INDEPENDENT_AMBULATORY_CARE_PROVIDER_SITE_OTHER): Payer: Medicare Other | Admitting: *Deleted

## 2015-09-26 DIAGNOSIS — Z86711 Personal history of pulmonary embolism: Secondary | ICD-10-CM | POA: Diagnosis not present

## 2015-09-26 DIAGNOSIS — Z5181 Encounter for therapeutic drug level monitoring: Secondary | ICD-10-CM

## 2015-09-26 LAB — POCT INR: INR: 2.4

## 2015-09-28 ENCOUNTER — Ambulatory Visit (INDEPENDENT_AMBULATORY_CARE_PROVIDER_SITE_OTHER): Payer: Medicare Other | Admitting: *Deleted

## 2015-09-28 DIAGNOSIS — I495 Sick sinus syndrome: Secondary | ICD-10-CM

## 2015-10-02 NOTE — Progress Notes (Signed)
Remote pacemaker transmission.   

## 2015-10-10 LAB — CUP PACEART REMOTE DEVICE CHECK
Battery Remaining Longevity: 99 mo
Brady Statistic AP VS Percent: 12 %
Brady Statistic AS VP Percent: 0 %
Date Time Interrogation Session: 20161202210029
Implantable Lead Implant Date: 19970315
Implantable Lead Location: 753860
Lead Channel Pacing Threshold Amplitude: 1.5 V
Lead Channel Pacing Threshold Pulse Width: 0.4 ms
Lead Channel Pacing Threshold Pulse Width: 0.4 ms
Lead Channel Setting Pacing Amplitude: 2.5 V
Lead Channel Setting Pacing Amplitude: 3 V
Lead Channel Setting Pacing Pulse Width: 1 ms
Lead Channel Setting Sensing Sensitivity: 4 mV
MDC IDC LEAD IMPLANT DT: 19970315
MDC IDC LEAD LOCATION: 753859
MDC IDC LEAD MODEL: 4024
MDC IDC LEAD MODEL: 4524
MDC IDC MSMT BATTERY IMPEDANCE: 482 Ohm
MDC IDC MSMT BATTERY VOLTAGE: 2.8 V
MDC IDC MSMT LEADCHNL RA IMPEDANCE VALUE: 700 Ohm
MDC IDC MSMT LEADCHNL RA PACING THRESHOLD AMPLITUDE: 1.5 V
MDC IDC MSMT LEADCHNL RA SENSING INTR AMPL: 1.4 mV
MDC IDC MSMT LEADCHNL RV IMPEDANCE VALUE: 729 Ohm
MDC IDC MSMT LEADCHNL RV SENSING INTR AMPL: 8 mV
MDC IDC STAT BRADY AP VP PERCENT: 0 %
MDC IDC STAT BRADY AS VS PERCENT: 88 %

## 2015-10-12 ENCOUNTER — Encounter: Payer: Self-pay | Admitting: Cardiology

## 2015-10-16 ENCOUNTER — Ambulatory Visit (INDEPENDENT_AMBULATORY_CARE_PROVIDER_SITE_OTHER): Payer: Medicare Other

## 2015-10-16 DIAGNOSIS — Z5181 Encounter for therapeutic drug level monitoring: Secondary | ICD-10-CM

## 2015-10-16 DIAGNOSIS — Z86711 Personal history of pulmonary embolism: Secondary | ICD-10-CM | POA: Diagnosis not present

## 2015-10-16 LAB — POCT INR: INR: 2.4

## 2015-10-22 ENCOUNTER — Other Ambulatory Visit: Payer: Self-pay | Admitting: Cardiovascular Disease

## 2015-10-23 NOTE — Telephone Encounter (Signed)
Please review for refill, Thank you. 

## 2015-10-26 ENCOUNTER — Encounter: Payer: Self-pay | Admitting: Cardiology

## 2015-11-07 ENCOUNTER — Ambulatory Visit (INDEPENDENT_AMBULATORY_CARE_PROVIDER_SITE_OTHER): Payer: Medicare Other

## 2015-11-07 DIAGNOSIS — Z86711 Personal history of pulmonary embolism: Secondary | ICD-10-CM | POA: Diagnosis not present

## 2015-11-07 DIAGNOSIS — Z5181 Encounter for therapeutic drug level monitoring: Secondary | ICD-10-CM | POA: Diagnosis not present

## 2015-11-07 LAB — POCT INR: INR: 2.5

## 2015-12-05 ENCOUNTER — Ambulatory Visit (INDEPENDENT_AMBULATORY_CARE_PROVIDER_SITE_OTHER): Payer: Medicare Other

## 2015-12-05 DIAGNOSIS — Z5181 Encounter for therapeutic drug level monitoring: Secondary | ICD-10-CM | POA: Diagnosis not present

## 2015-12-05 DIAGNOSIS — Z86711 Personal history of pulmonary embolism: Secondary | ICD-10-CM

## 2015-12-05 LAB — POCT INR: INR: 3.3

## 2015-12-26 ENCOUNTER — Ambulatory Visit (INDEPENDENT_AMBULATORY_CARE_PROVIDER_SITE_OTHER): Payer: Medicare Other

## 2015-12-26 DIAGNOSIS — Z5181 Encounter for therapeutic drug level monitoring: Secondary | ICD-10-CM

## 2015-12-26 DIAGNOSIS — Z86711 Personal history of pulmonary embolism: Secondary | ICD-10-CM | POA: Diagnosis not present

## 2015-12-26 LAB — POCT INR: INR: 3.2

## 2016-01-09 ENCOUNTER — Ambulatory Visit (INDEPENDENT_AMBULATORY_CARE_PROVIDER_SITE_OTHER): Payer: Medicare Other

## 2016-01-09 DIAGNOSIS — Z5181 Encounter for therapeutic drug level monitoring: Secondary | ICD-10-CM

## 2016-01-09 DIAGNOSIS — Z86711 Personal history of pulmonary embolism: Secondary | ICD-10-CM

## 2016-01-09 LAB — POCT INR: INR: 2.5

## 2016-02-03 ENCOUNTER — Other Ambulatory Visit: Payer: Self-pay | Admitting: Cardiovascular Disease

## 2016-02-04 NOTE — Telephone Encounter (Signed)
Please review for refill. Thanks!  

## 2016-02-13 ENCOUNTER — Ambulatory Visit (INDEPENDENT_AMBULATORY_CARE_PROVIDER_SITE_OTHER): Payer: Medicare Other

## 2016-02-13 DIAGNOSIS — Z5181 Encounter for therapeutic drug level monitoring: Secondary | ICD-10-CM

## 2016-02-13 DIAGNOSIS — Z86711 Personal history of pulmonary embolism: Secondary | ICD-10-CM | POA: Diagnosis not present

## 2016-02-13 LAB — POCT INR: INR: 2.3

## 2016-03-04 ENCOUNTER — Encounter: Payer: Self-pay | Admitting: Internal Medicine

## 2016-03-04 ENCOUNTER — Ambulatory Visit (INDEPENDENT_AMBULATORY_CARE_PROVIDER_SITE_OTHER): Payer: Medicare Other | Admitting: Internal Medicine

## 2016-03-04 VITALS — BP 122/90 | HR 50 | Ht 60.0 in | Wt 185.5 lb

## 2016-03-04 DIAGNOSIS — Z950 Presence of cardiac pacemaker: Secondary | ICD-10-CM | POA: Diagnosis not present

## 2016-03-04 DIAGNOSIS — R42 Dizziness and giddiness: Secondary | ICD-10-CM

## 2016-03-04 DIAGNOSIS — T82119A Breakdown (mechanical) of unspecified cardiac electronic device, initial encounter: Secondary | ICD-10-CM

## 2016-03-04 DIAGNOSIS — T82111A Breakdown (mechanical) of cardiac pulse generator (battery), initial encounter: Secondary | ICD-10-CM

## 2016-03-04 DIAGNOSIS — I158 Other secondary hypertension: Secondary | ICD-10-CM | POA: Diagnosis not present

## 2016-03-04 DIAGNOSIS — I495 Sick sinus syndrome: Secondary | ICD-10-CM

## 2016-03-04 DIAGNOSIS — R002 Palpitations: Secondary | ICD-10-CM

## 2016-03-04 LAB — CUP PACEART INCLINIC DEVICE CHECK
Date Time Interrogation Session: 20170510133253
Implantable Lead Implant Date: 19970315
Implantable Lead Location: 753859
Implantable Lead Model: 4024
Lead Channel Setting Pacing Pulse Width: 1 ms
Lead Channel Setting Sensing Sensitivity: 4 mV
MDC IDC LEAD IMPLANT DT: 19970315
MDC IDC LEAD LOCATION: 753860
MDC IDC LEAD MODEL: 4524
MDC IDC SET LEADCHNL RA PACING AMPLITUDE: 3 V
MDC IDC SET LEADCHNL RV PACING AMPLITUDE: 2.5 V

## 2016-03-04 NOTE — Patient Instructions (Signed)
Medication Instructions: - Decrease metoprolol tartrate to 25 mg in the morning and 50 mg in the evening  Labwork: - none  Procedures/Testing: - none  Follow-Up: - Remote monitoring is used to monitor your Pacemaker of ICD from home. This monitoring reduces the number of office visits required to check your device to one time per year. It allows us to keep an eye on the functioning of your device to ensure it is working properly. You are scheduled for a device check from home on 06/03/16. You may send your transmission at any time that day. If you have a wireless device, the transmission will be sent automatically. After your physician reviews your transmission, you will receive a postcard with your next transmission date.  - Your physician wants you to follow-up in: 1 year with Dr. Graciela HusbandsKlein. You will receive a reminder letter in the mail two months in advance. If you don't receive a letter, please call our office to schedule the follow-up appointment.  Any Additional Special Instructions Will Be Listed Below (If Applicable).     If you need a refill on your cardiac medications before your next appointment, please call your pharmacy.
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
## 2016-03-12 ENCOUNTER — Ambulatory Visit (INDEPENDENT_AMBULATORY_CARE_PROVIDER_SITE_OTHER): Payer: Medicare Other

## 2016-03-12 DIAGNOSIS — Z86711 Personal history of pulmonary embolism: Secondary | ICD-10-CM | POA: Diagnosis not present

## 2016-03-12 DIAGNOSIS — Z5181 Encounter for therapeutic drug level monitoring: Secondary | ICD-10-CM | POA: Diagnosis not present

## 2016-03-12 LAB — POCT INR: INR: 1.9

## 2016-04-09 ENCOUNTER — Ambulatory Visit (INDEPENDENT_AMBULATORY_CARE_PROVIDER_SITE_OTHER): Payer: Medicare Other

## 2016-04-09 DIAGNOSIS — Z86711 Personal history of pulmonary embolism: Secondary | ICD-10-CM

## 2016-04-09 DIAGNOSIS — Z5181 Encounter for therapeutic drug level monitoring: Secondary | ICD-10-CM

## 2016-04-09 LAB — POCT INR: INR: 2.3

## 2016-04-24 ENCOUNTER — Encounter: Payer: Self-pay | Admitting: Internal Medicine

## 2016-04-24 ENCOUNTER — Ambulatory Visit (INDEPENDENT_AMBULATORY_CARE_PROVIDER_SITE_OTHER): Payer: Medicare Other | Admitting: Internal Medicine

## 2016-04-24 VITALS — BP 122/64 | HR 56 | Ht 60.0 in | Wt 188.2 lb

## 2016-04-24 DIAGNOSIS — Z950 Presence of cardiac pacemaker: Secondary | ICD-10-CM

## 2016-04-24 DIAGNOSIS — I495 Sick sinus syndrome: Secondary | ICD-10-CM | POA: Diagnosis not present

## 2016-04-24 DIAGNOSIS — R55 Syncope and collapse: Secondary | ICD-10-CM

## 2016-04-24 DIAGNOSIS — I499 Cardiac arrhythmia, unspecified: Secondary | ICD-10-CM

## 2016-04-24 LAB — CUP PACEART INCLINIC DEVICE CHECK
Battery Impedance: 584 Ohm
Battery Remaining Longevity: 88 mo
Brady Statistic AP VP Percent: 1 %
Date Time Interrogation Session: 20170629173955
Implantable Lead Implant Date: 19970315
Implantable Lead Location: 753860
Implantable Lead Model: 4524
Lead Channel Impedance Value: 687 Ohm
Lead Channel Pacing Threshold Amplitude: 1.25 V
Lead Channel Sensing Intrinsic Amplitude: 1.4 mV
Lead Channel Setting Pacing Amplitude: 2.5 V
MDC IDC LEAD IMPLANT DT: 19970315
MDC IDC LEAD LOCATION: 753859
MDC IDC LEAD MODEL: 4024
MDC IDC MSMT BATTERY VOLTAGE: 2.79 V
MDC IDC MSMT LEADCHNL RA PACING THRESHOLD PULSEWIDTH: 0.4 ms
MDC IDC MSMT LEADCHNL RV IMPEDANCE VALUE: 638 Ohm
MDC IDC MSMT LEADCHNL RV PACING THRESHOLD AMPLITUDE: 1.25 V
MDC IDC MSMT LEADCHNL RV PACING THRESHOLD PULSEWIDTH: 1 ms
MDC IDC MSMT LEADCHNL RV SENSING INTR AMPL: 11.2 mV
MDC IDC SET LEADCHNL RA PACING AMPLITUDE: 3 V
MDC IDC SET LEADCHNL RV PACING PULSEWIDTH: 1 ms
MDC IDC SET LEADCHNL RV SENSING SENSITIVITY: 4 mV
MDC IDC STAT BRADY AP VS PERCENT: 28 %
MDC IDC STAT BRADY AS VP PERCENT: 0 %
MDC IDC STAT BRADY AS VS PERCENT: 71 %
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
## 2016-05-01 ENCOUNTER — Encounter: Payer: Self-pay | Admitting: Internal Medicine

## 2016-05-05 ENCOUNTER — Other Ambulatory Visit: Payer: Self-pay | Admitting: Internal Medicine

## 2016-05-06 ENCOUNTER — Encounter: Payer: Self-pay | Admitting: Internal Medicine

## 2016-05-14 ENCOUNTER — Ambulatory Visit (INDEPENDENT_AMBULATORY_CARE_PROVIDER_SITE_OTHER): Payer: Medicare Other

## 2016-05-14 DIAGNOSIS — Z86711 Personal history of pulmonary embolism: Secondary | ICD-10-CM

## 2016-05-14 DIAGNOSIS — Z5181 Encounter for therapeutic drug level monitoring: Secondary | ICD-10-CM | POA: Diagnosis not present

## 2016-05-14 LAB — POCT INR: INR: 1.9

## 2016-06-11 ENCOUNTER — Ambulatory Visit (INDEPENDENT_AMBULATORY_CARE_PROVIDER_SITE_OTHER): Payer: Medicare Other

## 2016-06-11 DIAGNOSIS — Z5181 Encounter for therapeutic drug level monitoring: Secondary | ICD-10-CM

## 2016-06-11 DIAGNOSIS — Z86711 Personal history of pulmonary embolism: Secondary | ICD-10-CM

## 2016-06-11 LAB — POCT INR: INR: 2.4

## 2016-06-18 ENCOUNTER — Other Ambulatory Visit: Payer: Self-pay | Admitting: Cardiovascular Disease

## 2016-06-18 NOTE — Telephone Encounter (Signed)
Please review for refill. Thanks!  

## 2016-07-09 ENCOUNTER — Ambulatory Visit (INDEPENDENT_AMBULATORY_CARE_PROVIDER_SITE_OTHER): Payer: Medicare Other | Admitting: *Deleted

## 2016-07-09 DIAGNOSIS — Z5181 Encounter for therapeutic drug level monitoring: Secondary | ICD-10-CM

## 2016-07-09 DIAGNOSIS — Z86711 Personal history of pulmonary embolism: Secondary | ICD-10-CM

## 2016-07-09 LAB — POCT INR: INR: 2.1

## 2016-07-24 ENCOUNTER — Encounter: Payer: Medicare Other | Admitting: *Deleted

## 2016-07-24 ENCOUNTER — Telehealth: Payer: Self-pay | Admitting: Cardiology

## 2016-07-24 NOTE — Telephone Encounter (Signed)
Spoke with pt and reminded pt of remote transmission that is due today. Pt verbalized understanding.   

## 2016-07-25 ENCOUNTER — Encounter: Payer: Self-pay | Admitting: Cardiology

## 2016-08-06 ENCOUNTER — Ambulatory Visit (INDEPENDENT_AMBULATORY_CARE_PROVIDER_SITE_OTHER): Payer: Medicare Other

## 2016-08-06 DIAGNOSIS — Z86711 Personal history of pulmonary embolism: Secondary | ICD-10-CM | POA: Diagnosis not present

## 2016-08-06 DIAGNOSIS — Z5181 Encounter for therapeutic drug level monitoring: Secondary | ICD-10-CM | POA: Diagnosis not present

## 2016-08-06 LAB — POCT INR: INR: 2.1

## 2016-08-14 ENCOUNTER — Ambulatory Visit (INDEPENDENT_AMBULATORY_CARE_PROVIDER_SITE_OTHER): Payer: Medicare Other | Admitting: *Deleted

## 2016-08-14 DIAGNOSIS — I495 Sick sinus syndrome: Secondary | ICD-10-CM | POA: Diagnosis not present

## 2016-08-15 ENCOUNTER — Encounter: Payer: Self-pay | Admitting: Cardiology

## 2016-08-15 NOTE — Progress Notes (Signed)
Remote pacemaker transmission.   

## 2016-08-29 ENCOUNTER — Encounter: Payer: Self-pay | Admitting: Cardiology

## 2016-09-09 LAB — CUP PACEART REMOTE DEVICE CHECK
Battery Voltage: 2.79 V
Brady Statistic AP VS Percent: 26 %
Implantable Lead Implant Date: 19970315
Implantable Lead Location: 753860
Lead Channel Impedance Value: 740 Ohm
Lead Channel Impedance Value: 760 Ohm
Lead Channel Pacing Threshold Amplitude: 1.5 V
Lead Channel Pacing Threshold Pulse Width: 0.4 ms
Lead Channel Setting Pacing Amplitude: 2.5 V
Lead Channel Setting Pacing Amplitude: 3 V
Lead Channel Setting Pacing Pulse Width: 1 ms
Lead Channel Setting Sensing Sensitivity: 4 mV
MDC IDC LEAD IMPLANT DT: 19970315
MDC IDC LEAD LOCATION: 753859
MDC IDC LEAD MODEL: 4024
MDC IDC LEAD MODEL: 4524
MDC IDC MSMT BATTERY IMPEDANCE: 660 Ohm
MDC IDC MSMT BATTERY REMAINING LONGEVITY: 84 mo
MDC IDC MSMT LEADCHNL RA PACING THRESHOLD AMPLITUDE: 1.5 V
MDC IDC MSMT LEADCHNL RA PACING THRESHOLD PULSEWIDTH: 0.4 ms
MDC IDC MSMT LEADCHNL RA SENSING INTR AMPL: 1.4 mV
MDC IDC MSMT LEADCHNL RV SENSING INTR AMPL: 8 mV
MDC IDC PG IMPLANT DT: 20090210
MDC IDC SESS DTM: 20171019193646
MDC IDC STAT BRADY AP VP PERCENT: 1 %
MDC IDC STAT BRADY AS VP PERCENT: 0 %
MDC IDC STAT BRADY AS VS PERCENT: 74 %

## 2016-09-17 ENCOUNTER — Ambulatory Visit (INDEPENDENT_AMBULATORY_CARE_PROVIDER_SITE_OTHER): Payer: Medicare Other

## 2016-09-17 DIAGNOSIS — Z5181 Encounter for therapeutic drug level monitoring: Secondary | ICD-10-CM | POA: Diagnosis not present

## 2016-09-17 DIAGNOSIS — Z86711 Personal history of pulmonary embolism: Secondary | ICD-10-CM

## 2016-09-17 LAB — POCT INR: INR: 2

## 2016-09-26 ENCOUNTER — Encounter: Payer: Self-pay | Admitting: Internal Medicine

## 2016-10-02 ENCOUNTER — Other Ambulatory Visit (HOSPITAL_COMMUNITY): Payer: Self-pay | Admitting: Neurosurgery

## 2016-10-02 DIAGNOSIS — M5137 Other intervertebral disc degeneration, lumbosacral region: Secondary | ICD-10-CM

## 2016-10-28 ENCOUNTER — Other Ambulatory Visit: Payer: Self-pay | Admitting: Cardiovascular Disease

## 2016-10-28 NOTE — Telephone Encounter (Signed)
Review for refill. 

## 2016-11-10 ENCOUNTER — Ambulatory Visit: Admission: RE | Admit: 2016-11-10 | Payer: Medicare HMO | Source: Ambulatory Visit

## 2016-11-10 ENCOUNTER — Ambulatory Visit: Payer: Medicare HMO

## 2016-11-13 ENCOUNTER — Encounter: Payer: Medicare Other | Admitting: *Deleted

## 2016-11-14 ENCOUNTER — Encounter: Payer: Self-pay | Admitting: Cardiology

## 2016-11-14 ENCOUNTER — Ambulatory Visit: Admission: RE | Admit: 2016-11-14 | Payer: Medicare Other | Source: Ambulatory Visit

## 2016-11-18 ENCOUNTER — Ambulatory Visit: Admission: RE | Admit: 2016-11-18 | Payer: Medicare HMO | Source: Ambulatory Visit

## 2016-11-19 ENCOUNTER — Ambulatory Visit (INDEPENDENT_AMBULATORY_CARE_PROVIDER_SITE_OTHER): Payer: Medicare HMO | Admitting: *Deleted

## 2016-11-19 DIAGNOSIS — Z86711 Personal history of pulmonary embolism: Secondary | ICD-10-CM | POA: Diagnosis not present

## 2016-11-19 DIAGNOSIS — Z5181 Encounter for therapeutic drug level monitoring: Secondary | ICD-10-CM | POA: Diagnosis not present

## 2016-11-19 LAB — POCT INR: INR: 2.5

## 2016-11-24 ENCOUNTER — Ambulatory Visit: Admission: RE | Admit: 2016-11-24 | Payer: Medicare HMO | Source: Ambulatory Visit

## 2016-11-26 ENCOUNTER — Ambulatory Visit (INDEPENDENT_AMBULATORY_CARE_PROVIDER_SITE_OTHER): Payer: Medicare HMO | Admitting: *Deleted

## 2016-11-26 DIAGNOSIS — I495 Sick sinus syndrome: Secondary | ICD-10-CM

## 2016-11-27 ENCOUNTER — Encounter: Payer: Self-pay | Admitting: Cardiology

## 2016-11-27 NOTE — Progress Notes (Signed)
Remote pacemaker transmission.   

## 2016-12-01 ENCOUNTER — Ambulatory Visit: Payer: Medicaid Other

## 2016-12-04 ENCOUNTER — Ambulatory Visit: Payer: Medicare HMO

## 2016-12-06 LAB — CUP PACEART REMOTE DEVICE CHECK
Battery Impedance: 762 Ohm
Battery Remaining Longevity: 79 mo
Battery Voltage: 2.78 V
Brady Statistic AP VP Percent: 0 %
Brady Statistic AS VP Percent: 0 %
Brady Statistic AS VS Percent: 77 %
Implantable Lead Implant Date: 19970315
Implantable Lead Implant Date: 19970315
Implantable Lead Location: 753860
Implantable Lead Model: 4024
Implantable Lead Model: 4524
Implantable Pulse Generator Implant Date: 20090210
Lead Channel Impedance Value: 754 Ohm
Lead Channel Pacing Threshold Amplitude: 1.25 V
Lead Channel Pacing Threshold Amplitude: 1.375 V
Lead Channel Setting Pacing Amplitude: 2.5 V
Lead Channel Setting Pacing Amplitude: 2.75 V
Lead Channel Setting Sensing Sensitivity: 4 mV
MDC IDC LEAD LOCATION: 753859
MDC IDC MSMT LEADCHNL RA PACING THRESHOLD PULSEWIDTH: 0.4 ms
MDC IDC MSMT LEADCHNL RV IMPEDANCE VALUE: 753 Ohm
MDC IDC MSMT LEADCHNL RV PACING THRESHOLD PULSEWIDTH: 0.4 ms
MDC IDC SESS DTM: 20180131194855
MDC IDC SET LEADCHNL RV PACING PULSEWIDTH: 1 ms
MDC IDC STAT BRADY AP VS PERCENT: 23 %

## 2016-12-10 ENCOUNTER — Ambulatory Visit (INDEPENDENT_AMBULATORY_CARE_PROVIDER_SITE_OTHER): Payer: Medicare HMO

## 2016-12-10 DIAGNOSIS — Z86711 Personal history of pulmonary embolism: Secondary | ICD-10-CM

## 2016-12-10 DIAGNOSIS — Z5181 Encounter for therapeutic drug level monitoring: Secondary | ICD-10-CM | POA: Diagnosis not present

## 2016-12-10 LAB — POCT INR: INR: 2.5

## 2016-12-11 ENCOUNTER — Telehealth: Payer: Self-pay | Admitting: Pharmacist

## 2016-12-11 ENCOUNTER — Encounter: Payer: Self-pay | Admitting: Cardiology

## 2016-12-11 MED ORDER — ENOXAPARIN SODIUM 80 MG/0.8ML ~~LOC~~ SOLN: 80.0000 mg | Freq: Two times a day (BID) | SUBCUTANEOUS | 0 refills | Status: DC

## 2016-12-11 NOTE — Telephone Encounter (Signed)
[facility] to undergo an EGD on 12/30/16. She takes Coumadin for hx of multiple PEs and has been bridged with Lovenox for multiple prior procedures. Recommend that pt be bridged with Lovenox for this procedure as well. Coumadin clinic to coordinate. Clearance faxed [phone]#[phone].

## 2016-12-11 NOTE — Patient Instructions (Signed)
12/24/16: Last dose of Coumadin.  12/25/16: No Coumadin or Lovenox.  12/26/16: Inject Lovenox 80mg  in the fatty abdominal tissue at least 2 inches from the belly button twice a day about 12 hours apart, 8am and 8pm rotate sites. No Coumadin.  12/27/16: Inject Lovenox in the fatty tissue every 12 hours, 8am and 8pm. No Coumadin.  12/28/16: Inject Lovenox in the fatty tissue every 12 hours, 8am and 8pm. No Coumadin.  12/29/16: Inject Lovenox in the fatty tissue in the morning at 8 am (No PM dose). No Coumadin.  12/30/16: Procedure Day - No Lovenox - Resume Coumadin in the evening or as directed by doctor (take an extra half tablet with usual dose for 2 days then resume normal dose).  12/31/16: Resume Lovenox inject in the fatty tissue every 12 hours and take Coumadin.  01/01/17: Inject Lovenox in the fatty tissue every 12 hours and take Coumadin.  01/02/17: Inject Lovenox in the fatty tissue every 12 hours and take Coumadin.  01/03/17: Inject Lovenox in the fatty tissue every 12 hours and take Coumadin.  01/04/17: Inject Lovenox in the fatty tissue every 12 hours and take Coumadin.  01/05/17: Coumadin appt to check INR.

## 2017-01-05 ENCOUNTER — Ambulatory Visit (INDEPENDENT_AMBULATORY_CARE_PROVIDER_SITE_OTHER): Payer: Medicare HMO | Admitting: *Deleted

## 2017-01-05 DIAGNOSIS — Z5181 Encounter for therapeutic drug level monitoring: Secondary | ICD-10-CM

## 2017-01-05 LAB — POCT INR: INR: 1.9

## 2017-01-12 LAB — PROTIME-INR: INR: 1.7 — AB (ref <=1.1)

## 2017-01-13 ENCOUNTER — Ambulatory Visit (INDEPENDENT_AMBULATORY_CARE_PROVIDER_SITE_OTHER): Payer: Medicare HMO | Admitting: Cardiovascular Disease

## 2017-01-13 DIAGNOSIS — Z86711 Personal history of pulmonary embolism: Secondary | ICD-10-CM

## 2017-01-13 DIAGNOSIS — Z5181 Encounter for therapeutic drug level monitoring: Secondary | ICD-10-CM

## 2017-01-28 LAB — PROTIME-INR: INR: 3.4 — AB (ref <=1.1)

## 2017-01-29 ENCOUNTER — Other Ambulatory Visit: Payer: Self-pay | Admitting: *Deleted

## 2017-01-29 ENCOUNTER — Ambulatory Visit (INDEPENDENT_AMBULATORY_CARE_PROVIDER_SITE_OTHER): Payer: Medicaid Other | Admitting: Cardiology

## 2017-01-29 DIAGNOSIS — Z5181 Encounter for therapeutic drug level monitoring: Secondary | ICD-10-CM

## 2017-01-29 DIAGNOSIS — Z86711 Personal history of pulmonary embolism: Secondary | ICD-10-CM

## 2017-01-29 MED ORDER — WARFARIN SODIUM 5 MG PO TABS: 5.0000 mg | ORAL_TABLET | ORAL | 2 refills | Status: DC

## 2017-01-29 NOTE — Telephone Encounter (Signed)
Received a 90 day request from pharmacy.

## 2017-02-04 ENCOUNTER — Telehealth: Payer: Self-pay | Admitting: Cardiovascular Disease

## 2017-02-04 NOTE — Telephone Encounter (Signed)
Pt is in [place], had tests ran to evaluate pain from bx. INR in hospital 4.6, pt was given vit K.  Pt discharged yesterday 02/03/17 INR 1.1. Pt's normal dosage is  daily except 7.5mg  on Wednesdays.  Advised pt to take  today, then 7.5mg  tomorrow, then resume same dosage  QD except 7.5mg  on Wednesdays.  Pt is returning tomorrow and will probably got to [facility] per GI MD instructions.  Pt will call us back to update on her where abouts and status. Pt advised will need appt next Wednesday 02/11/17 if not in hospital to recheck INR.  Pt verbalized understanding.

## 2017-02-04 NOTE — Telephone Encounter (Signed)
Pt states her INR was 4.6. She states she was in the hospital in [place] and they gave her a dose of Vitamin K and it dropped to 1.1

## 2017-02-18 ENCOUNTER — Ambulatory Visit (INDEPENDENT_AMBULATORY_CARE_PROVIDER_SITE_OTHER): Payer: Medicare HMO

## 2017-02-18 DIAGNOSIS — Z5181 Encounter for therapeutic drug level monitoring: Secondary | ICD-10-CM

## 2017-02-18 DIAGNOSIS — Z86711 Personal history of pulmonary embolism: Secondary | ICD-10-CM | POA: Diagnosis not present

## 2017-02-18 LAB — POCT INR: INR: 2.3

## 2017-02-25 ENCOUNTER — Ambulatory Visit (INDEPENDENT_AMBULATORY_CARE_PROVIDER_SITE_OTHER): Payer: Medicare HMO | Admitting: *Deleted

## 2017-02-25 DIAGNOSIS — Z5181 Encounter for therapeutic drug level monitoring: Secondary | ICD-10-CM | POA: Diagnosis not present

## 2017-02-25 DIAGNOSIS — Z86711 Personal history of pulmonary embolism: Secondary | ICD-10-CM | POA: Diagnosis not present

## 2017-02-25 LAB — POCT INR: INR: 2.4

## 2017-03-04 ENCOUNTER — Ambulatory Visit (INDEPENDENT_AMBULATORY_CARE_PROVIDER_SITE_OTHER): Payer: Medicare HMO

## 2017-03-04 DIAGNOSIS — Z5181 Encounter for therapeutic drug level monitoring: Secondary | ICD-10-CM | POA: Diagnosis not present

## 2017-03-04 DIAGNOSIS — Z86711 Personal history of pulmonary embolism: Secondary | ICD-10-CM | POA: Diagnosis not present

## 2017-03-04 LAB — POCT INR: INR: 1.4

## 2017-03-10 ENCOUNTER — Ambulatory Visit (INDEPENDENT_AMBULATORY_CARE_PROVIDER_SITE_OTHER): Payer: Medicare HMO | Admitting: Internal Medicine

## 2017-03-10 ENCOUNTER — Encounter: Payer: Self-pay | Admitting: Internal Medicine

## 2017-03-10 ENCOUNTER — Ambulatory Visit (INDEPENDENT_AMBULATORY_CARE_PROVIDER_SITE_OTHER): Payer: Medicare HMO

## 2017-03-10 VITALS — BP 98/64 | HR 57 | Ht 60.0 in | Wt 179.5 lb

## 2017-03-10 DIAGNOSIS — Z950 Presence of cardiac pacemaker: Secondary | ICD-10-CM

## 2017-03-10 DIAGNOSIS — I495 Sick sinus syndrome: Secondary | ICD-10-CM

## 2017-03-10 DIAGNOSIS — Z5181 Encounter for therapeutic drug level monitoring: Secondary | ICD-10-CM

## 2017-03-10 DIAGNOSIS — Z86711 Personal history of pulmonary embolism: Secondary | ICD-10-CM

## 2017-03-10 DIAGNOSIS — I440 Atrioventricular block, first degree: Secondary | ICD-10-CM | POA: Diagnosis not present

## 2017-03-10 LAB — POCT INR: INR: 1.4

## 2017-03-10 NOTE — Progress Notes (Signed)
Patient Care Team: System, Pcp Not In as PCP - General   HPI  Susan Howell is a 61 y.o. female Seen in followup for a pacemaker inserted for sinus node dysfunction.  She has a history of hypertension.    She has a history of recurrent pulmonary embolism  She is tolerating her Coumadin without bleeding.  She has intercurrently been at [facility]. 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 Cultures were positive for an unusual strep species as well as Actinomyces; unfortunately she continues to have fevers and night sweats. Blood cultures reportedly were negative looking at the [facility] notes.  She is gradually regaining her strength.  She has some shortness of breath but no peripheral edema.  She has continued to struggle with lightheadedness. We have adjusted her medications as an outpatient with discontinuation of her diuretics and down titration of her blood pressure medications   Last echocardiogram at [facility] 2016 EF normal; no echocardiogram was done at [facility] during her recent hospitalization  Past Medical History:  Diagnosis Date  . Acute stress reaction    Husband passed 2/2 car falling on him that he was working on 2014   . Anxiety   . Arthritis   . Cystitis   . Depression   . Fibromyalgia   . H/O diastolic dysfunction    a. Echo 12/13/12: EF 65-70%, G2DD, pulm press . b. Echo 02/03/13: 50-55%, no WMA, lef ventricular diastolic function nl, pulm arteries w/in nl range, mild LA dilation.    Marland Kitchen Headache(784.0)   . History of urinary tract infection   . HTN (hypertension)   . Lung nodules   . Motility disorder, esophageal   . Obesity   . Obesity   . Orthostatic hypotension   . Pacemaker 2009   a. Dr. Ladona Ridgel. b. Medtronic, SN# [id number], est longevity 10.5 years  . Palpitations   . Pulmonary embolism (HCC)    a. 2/2 gastric bypass, 1979. b. 12/12/2012  . Sinoatrial node dysfunction (HCC) 2009  . Sleep apnea     a. no cpap now uses 02 2L at night  . Thyroid nodule     Past Surgical History:  Procedure Laterality Date  . ABDOMINAL HYSTERECTOMY    . ABDOMINAL SURGERY     muscle deteriorated in abdomen  . APPENDECTOMY    . CHOLECYSTECTOMY    . ECTOPIC PREGNANCY SURGERY    . EUS N/A 03/03/2013   Procedure: UPPER ENDOSCOPIC ULTRASOUND (EUS) LINEAR;  Surgeon: Rachael Fee, MD;  Location: [facility];  Service: Endoscopy;  Laterality: N/A;  . FOOT SURGERY     both feet, and ankles  . GASTRIC BYPASS    . KNEE SURGERY Bilateral   . LAMINECTOMY    . OPEN REDUCTION INTERNAL FIXATION (ORIF) METACARPAL Left 03/22/2015   Procedure: OPEN REDUCTION INTERNAL FIXATION (ORIF) LEFT SMALL FINGER;  Surgeon: Knute Neu, MD;  Location: [facility] OR;  Service: Orthopedics;  Laterality: Left;  . ORIF ANKLE FRACTURE Left 03/22/2015   Procedure: OPEN REDUCTION INTERNAL FIXATION (ORIF) BIMAL ANKLE FRACTURE;  Surgeon: Sheral Apley, MD;  Location: [facility] OR;  Service: Orthopedics;  Laterality: Left;  . PACEMAKER PLACEMENT     x2, pacemaker leads replaced  . RIB RESECTION    . 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 STOMACH SURGERY     "2/3 of stomach removed"  Current Outpatient Prescriptions  Medication Sig Dispense Refill  . clonazePAM (KLONOPIN) 0.25 MG disintegrating tablet Take 0.25 mg by mouth 2 (two) times daily.    . fluticasone (FLONASE) 50 MCG/ACT nasal spray Place 2 sprays into both nostrils daily as needed for allergies or rhinitis.     . metoprolol (LOPRESSOR) 50 MG tablet Take 1/2 tablet (25 mg) in the morning and one tablet (50 mg) in the evening    . morphine (MSIR) 30 MG tablet Take 30 mg by mouth as directed. 15 mg at night and 30 mg in the morning    . Multiple Vitamin (MULTIVITAMIN) tablet Take 1 tablet by mouth daily.    Marland Kitchen. nystatin (MYCOSTATIN) 100000 UNIT/ML suspension Take 5 mLs by mouth as directed.    . ondansetron (ZOFRAN) 4 MG tablet  Take 1 tablet (4 mg total) by mouth every 8 (eight) hours as needed for nausea or vomiting. 20 tablet 0  . Oxycodone HCl 10 MG TABS Take 10 mg by mouth 3 (three) times daily as needed.    . traZODone (DESYREL) 150 MG tablet Take 150 mg by mouth at bedtime.    Marland Kitchen. warfarin (COUMADIN) 5 MG tablet Take 1 tablet (5 mg total) by mouth as directed. 35 tablet 2   No current facility-administered medications for this visit.     Allergies  Allergen Reactions  . Iodine Other (See Comments)    seizures  . Labetalol Hcl Hypertension  . Labetalol Hcl Other (See Comments)  . Tetanus Toxoid, Adsorbed Swelling    Fever, flushing and hardening of the injection site  . Bactrim [Sulfamethoxazole-Trimethoprim] Other (See Comments)    R/T Coumadin  . Eszopiclone Other (See Comments)    REACTION: NIGHT MARES  . Iohexol Nausea And Vomiting and Other (See Comments)     Desc: NAUSEA,VOMITING & SZ S/P CONTRAST INJ 30 YRS AGO// OK W/ PRE MEDS, BENADRYL & SOLUMEDROL TODAY//A.C., Onset Date: [id number]   . Penicillins Nausea And Vomiting    Cannot take tablets by mouth; IV is fine.   . Smallpox Vaccine Other (See Comments)    Swelling at injection site, fever  . Tetanus Toxoids Swelling and Other (See Comments)    Fever, flushing and hardening of the injection site  . Topamax Other (See Comments)    Confusion, high blood pressure.   . Vancomycin Nausea Only    Severe chills  . Albuterol     Inhaler made patient heart race  . Amitriptyline     sedation  . Ceftriaxone     Rash  . Gabapentin     Nightmares, mood swings, nausea  . Latex   . Nitrofuran Derivatives     Loss of appetite  . Tape     Other reaction(s): RASH  . Topiramate     Other reaction(s): OTHER  . Aliskiren Other (See Comments)    Caused SYNCOPE  . Aliskiren Fumarate Other (See Comments)    Caused SYNCOPE  . Amoxicillin Nausea And Vomiting    Cannot take tablets by mouth; IV is fine.   . Codeine Itching    Review of Systems  negative except from HPI and PMH  Physical Exam BP 98/64 (BP Location: Left Arm, Patient Position: Sitting, Cuff Size: Normal)   Pulse (!) 57   Ht 5' (1.524 m)   Wt 179 lb 8 oz (81.4 kg)   BMI 35.06 kg/m  Morbidly obese  Caucasian woman HENT normal Neck supple  Clear Regular rate and rhythm, no murmurs  or gallops Abd-soft   No Clubbing cyanosis edema Skin-warm and dry A & Oriented  Grossly normal sensory and motor function  ECG demonstrates Atrially paced at 57  26/13/45  Right bundle branch block   Assessment and plan  Sinus node dysfunction  Low blood pressure  1AVB   Pacemaker-Medtronic  The patient's device was interrogated.  The information was reviewed. No changes were made in the programming.   Recent sepsis syndrome with positive abscess cultures were negative. Cultures  Recurrent pulmonary embolism on warfarin    Device function is normal  Blood pressure is low and with lightheadedness we will discontinue her metoprolol. There is been no significant interval arrhythmia  I am concerned about ongoing fevers and chills. In the context of her device not withstanding the negative blood cultures I would be concerned about device infection. I've advised her to call [facility] again and let them know about her fevers and chills and that when she is evaluated at cardiology's input will be necessary. I suspect she will need a TEE and if infection were noted she would need of extraction  [facility] notes were reviewed  More than 50% of 45 min was spent in counseling related to the above

## 2017-03-10 NOTE — Patient Instructions (Signed)
Medication Instructions: - Your physician has recommended you make the following change in your medication:  1) Stop metoprolol  Labwork: - none ordered  Procedures/Testing: - none ordered  Follow-Up: - Remote monitoring is used to monitor your Pacemaker of ICD from home. This monitoring reduces the number of office visits required to check your device to one time per year. It allows us to keep an eye on the functioning of your device to ensure it is working properly. You are scheduled for a device check from home on 06/09/17. You may send your transmission at any time that day. If you have a wireless device, the transmission will be sent automatically. After your physician reviews your transmission, you will receive a postcard with your next transmission date.  - Your physician wants you to follow-up in: 1 year with Dr. Graciela HusbandsKlein. You will receive a reminder letter in the mail two months in advance. If you don't receive a letter, please call our office to schedule the follow-up appointment.  Any Additional Special Instructions Will Be Listed Below (If Applicable).     If you need a refill on your cardiac medications before your next appointment, please call your pharmacy.

## 2017-03-18 ENCOUNTER — Ambulatory Visit (INDEPENDENT_AMBULATORY_CARE_PROVIDER_SITE_OTHER): Payer: Medicare HMO

## 2017-03-18 DIAGNOSIS — Z5181 Encounter for therapeutic drug level monitoring: Secondary | ICD-10-CM | POA: Diagnosis not present

## 2017-03-18 DIAGNOSIS — Z86711 Personal history of pulmonary embolism: Secondary | ICD-10-CM

## 2017-03-18 LAB — POCT INR: INR: 1.8

## 2017-03-19 ENCOUNTER — Telehealth: Payer: Self-pay | Admitting: Internal Medicine

## 2017-03-19 NOTE — Telephone Encounter (Signed)
Pt states she started taking Doxycyline last night, and wanted to let us to know.

## 2017-03-19 NOTE — Telephone Encounter (Signed)
Pt started Doxy BID last night for 21 days. Will schedule appt for next Wed.

## 2017-03-25 ENCOUNTER — Ambulatory Visit (INDEPENDENT_AMBULATORY_CARE_PROVIDER_SITE_OTHER): Payer: Medicare HMO

## 2017-03-25 DIAGNOSIS — Z86711 Personal history of pulmonary embolism: Secondary | ICD-10-CM

## 2017-03-25 DIAGNOSIS — Z5181 Encounter for therapeutic drug level monitoring: Secondary | ICD-10-CM

## 2017-03-25 LAB — POCT INR: INR: 2.3

## 2017-03-27 ENCOUNTER — Other Ambulatory Visit: Payer: Self-pay | Admitting: Internal Medicine

## 2017-03-27 LAB — CUP PACEART INCLINIC DEVICE CHECK
Brady Statistic AP VP Percent: 0 %
Brady Statistic AP VS Percent: 19 %
Brady Statistic AS VP Percent: 0 %
Brady Statistic AS VS Percent: 80 %
Implantable Lead Implant Date: 19970315
Implantable Lead Implant Date: 19970315
Implantable Lead Location: 753859
Implantable Lead Location: 753860
Implantable Lead Model: 4524
Lead Channel Impedance Value: 726 Ohm
Lead Channel Sensing Intrinsic Amplitude: 2 mV
Lead Channel Setting Pacing Amplitude: 2.5 V
Lead Channel Setting Pacing Amplitude: 2.5 V
Lead Channel Setting Sensing Sensitivity: 4 mV
MDC IDC MSMT BATTERY IMPEDANCE: 761 Ohm
MDC IDC MSMT BATTERY REMAINING LONGEVITY: 79 mo
MDC IDC MSMT BATTERY VOLTAGE: 2.78 V
MDC IDC MSMT LEADCHNL RA PACING THRESHOLD AMPLITUDE: 1.25 V
MDC IDC MSMT LEADCHNL RA PACING THRESHOLD PULSEWIDTH: 0.4 ms
MDC IDC MSMT LEADCHNL RV IMPEDANCE VALUE: 769 Ohm
MDC IDC MSMT LEADCHNL RV PACING THRESHOLD AMPLITUDE: 1 V
MDC IDC MSMT LEADCHNL RV PACING THRESHOLD PULSEWIDTH: 1 ms
MDC IDC MSMT LEADCHNL RV SENSING INTR AMPL: 11.2 mV
MDC IDC PG IMPLANT DT: 20090210
MDC IDC SESS DTM: 20180515163355
MDC IDC SET LEADCHNL RV PACING PULSEWIDTH: 1 ms

## 2017-03-28 ENCOUNTER — Other Ambulatory Visit: Payer: Self-pay | Admitting: Cardiovascular Disease

## 2017-03-30 NOTE — Telephone Encounter (Signed)
Please review for refill, Thanks !  

## 2017-04-08 ENCOUNTER — Other Ambulatory Visit: Payer: Self-pay | Admitting: Cardiovascular Disease

## 2017-04-08 ENCOUNTER — Ambulatory Visit (INDEPENDENT_AMBULATORY_CARE_PROVIDER_SITE_OTHER): Payer: Medicare HMO

## 2017-04-08 DIAGNOSIS — Z86711 Personal history of pulmonary embolism: Secondary | ICD-10-CM

## 2017-04-08 DIAGNOSIS — Z5181 Encounter for therapeutic drug level monitoring: Secondary | ICD-10-CM

## 2017-04-08 LAB — POCT INR: INR: 2.3

## 2017-04-08 MED ORDER — ENOXAPARIN SODIUM 80 MG/0.8ML ~~LOC~~ SOLN: 80.0000 mg | Freq: Two times a day (BID) | SUBCUTANEOUS | 0 refills | Status: DC

## 2017-04-08 NOTE — Telephone Encounter (Signed)
Refill Request.  

## 2017-04-08 NOTE — Patient Instructions (Signed)
04/10/17: Last dose of Coumadin.  04/11/17: No Coumadin or Lovenox.  04/12/17: Inject Lovenox 80mg  in the fatty abdominal tissue at least 2 inches from the belly button twice a day about 12 hours apart, 8am and 8pm rotate sites. No Coumadin.  04/13/17: Inject Lovenox in the fatty tissue every 12 hours, 8am and 8pm. No Coumadin.  04/14/17: Inject Lovenox in the fatty tissue every 12 hours, 8am and 8pm. No Coumadin.  04/15/17: Inject Lovenox in the fatty tissue in the morning at 8 am (No PM dose). No Coumadin.  04/16/17: Procedure Day - No Lovenox - Resume Coumadin in the evening or as directed by doctor (take an extra half tablet with usual dose for 2 days then resume normal dose).  04/17/17: Resume Lovenox inject in the fatty tissue every 12 hours and take Coumadin.  04/18/17: Inject Lovenox in the fatty tissue every 12 hours and take Coumadin.  04/19/17: Inject Lovenox in the fatty tissue every 12 hours and take Coumadin.  04/20/17: Inject Lovenox in the fatty tissue every 12 hours and take Coumadin.  04/21/17: Inject Lovenox in the fatty tissue every 12 hours and take Coumadin.  04/22/17: Coumadin appt to check INR.

## 2017-04-09 ENCOUNTER — Telehealth: Payer: Self-pay | Admitting: Cardiovascular Disease

## 2017-04-09 NOTE — Telephone Encounter (Signed)
Returned call to the pt & unable to leave a msg; states voicemail not set up.  Called her on her cellphone & pt states that she was unsure but thinks they needed a code for her Lovenox.  Advised pt that I would call the Pharmacy to find out.    Called the Pharmacy & they stated that the pt needed a prior authorization & they faxed this to our [place] office to Dr. Mariah MillingGollan.   Spoke with Kem KaysMandi, RN at Dr. Windell HummingbirdGollan's office to see if the paper work had been received & she stated they have not received it to date.  Called the Pharmacy back to see if they could fax the paperwork again so we can get the Prior Auth completed. She stated she would fax over the info again attention to [place]

## 2017-04-09 NOTE — Telephone Encounter (Signed)
Pt states there is a problem at the pharmacy with her Lovenox injection. Please call.

## 2017-04-10 NOTE — Telephone Encounter (Signed)
Spoke w/ [facility] [phone]. PA started over the phone.  Questions answered and we should receive a response w/in 24-48 hrs via fax. Ref # [id number].

## 2017-04-10 NOTE — Telephone Encounter (Signed)
Called for status update to ensure pt receives before weekend. [facility] not marked as urgent thus turn around time is typically 72 hours. Requested to be marked as urgent. They reviewed while on phone since weekend and pt needs to start this weekend.   PA approved.   Spoke to pharmacy and rx able to be filled for $1.25 copay.

## 2017-04-22 ENCOUNTER — Ambulatory Visit (INDEPENDENT_AMBULATORY_CARE_PROVIDER_SITE_OTHER): Payer: Medicare HMO

## 2017-04-22 DIAGNOSIS — Z86711 Personal history of pulmonary embolism: Secondary | ICD-10-CM | POA: Diagnosis not present

## 2017-04-22 DIAGNOSIS — Z5181 Encounter for therapeutic drug level monitoring: Secondary | ICD-10-CM | POA: Diagnosis not present

## 2017-04-22 LAB — POCT INR: INR: 1.6

## 2017-04-30 ENCOUNTER — Ambulatory Visit (INDEPENDENT_AMBULATORY_CARE_PROVIDER_SITE_OTHER): Payer: Medicare HMO | Admitting: *Deleted

## 2017-04-30 DIAGNOSIS — Z5181 Encounter for therapeutic drug level monitoring: Secondary | ICD-10-CM | POA: Diagnosis not present

## 2017-04-30 LAB — POCT INR: INR: 2

## 2017-05-13 ENCOUNTER — Ambulatory Visit (INDEPENDENT_AMBULATORY_CARE_PROVIDER_SITE_OTHER): Payer: Medicare HMO | Admitting: *Deleted

## 2017-05-13 DIAGNOSIS — Z86711 Personal history of pulmonary embolism: Secondary | ICD-10-CM | POA: Diagnosis not present

## 2017-05-13 DIAGNOSIS — Z5181 Encounter for therapeutic drug level monitoring: Secondary | ICD-10-CM

## 2017-05-13 LAB — POCT INR: INR: 2

## 2017-06-03 ENCOUNTER — Ambulatory Visit (INDEPENDENT_AMBULATORY_CARE_PROVIDER_SITE_OTHER): Payer: Medicare HMO

## 2017-06-03 DIAGNOSIS — Z5181 Encounter for therapeutic drug level monitoring: Secondary | ICD-10-CM | POA: Diagnosis not present

## 2017-06-03 DIAGNOSIS — Z86711 Personal history of pulmonary embolism: Secondary | ICD-10-CM

## 2017-06-03 LAB — POCT INR: INR: 1.7

## 2017-06-08 ENCOUNTER — Other Ambulatory Visit: Payer: Self-pay | Admitting: Student

## 2017-06-08 DIAGNOSIS — M5416 Radiculopathy, lumbar region: Secondary | ICD-10-CM

## 2017-06-09 ENCOUNTER — Ambulatory Visit (INDEPENDENT_AMBULATORY_CARE_PROVIDER_SITE_OTHER): Payer: Medicare HMO | Admitting: *Deleted

## 2017-06-09 DIAGNOSIS — Z950 Presence of cardiac pacemaker: Secondary | ICD-10-CM

## 2017-06-09 DIAGNOSIS — I495 Sick sinus syndrome: Secondary | ICD-10-CM

## 2017-06-10 LAB — CUP PACEART REMOTE DEVICE CHECK
Battery Remaining Longevity: 77 mo
Battery Voltage: 2.79 V
Brady Statistic AP VS Percent: 16 %
Brady Statistic AS VS Percent: 83 %
Implantable Lead Implant Date: 19970315
Implantable Lead Location: 753859
Lead Channel Impedance Value: 740 Ohm
Lead Channel Impedance Value: 833 Ohm
Lead Channel Pacing Threshold Amplitude: 1.5 V
Lead Channel Setting Sensing Sensitivity: 4 mV
MDC IDC LEAD IMPLANT DT: 19970315
MDC IDC LEAD LOCATION: 753860
MDC IDC MSMT BATTERY IMPEDANCE: 866 Ohm
MDC IDC MSMT LEADCHNL RA PACING THRESHOLD PULSEWIDTH: 0.4 ms
MDC IDC PG IMPLANT DT: 20090210
MDC IDC SESS DTM: 20180814154954
MDC IDC SET LEADCHNL RA PACING AMPLITUDE: 2.5 V
MDC IDC SET LEADCHNL RV PACING AMPLITUDE: 2.5 V
MDC IDC SET LEADCHNL RV PACING PULSEWIDTH: 1 ms
MDC IDC STAT BRADY AP VP PERCENT: 0 %
MDC IDC STAT BRADY AS VP PERCENT: 0 %

## 2017-06-10 NOTE — Progress Notes (Signed)
Remote pacemaker check. 

## 2017-06-15 ENCOUNTER — Ambulatory Visit
Admission: RE | Admit: 2017-06-15 | Discharge: 2017-06-15 | Disposition: A | Discharge status: Home / Self Care | Payer: Medicare HMO | Source: Ambulatory Visit | Attending: Student | Admitting: Student

## 2017-06-15 DIAGNOSIS — M48061 Spinal stenosis, lumbar region without neurogenic claudication: Secondary | ICD-10-CM | POA: Diagnosis not present

## 2017-06-15 DIAGNOSIS — M4807 Spinal stenosis, lumbosacral region: Secondary | ICD-10-CM | POA: Insufficient documentation

## 2017-06-15 DIAGNOSIS — M5416 Radiculopathy, lumbar region: Secondary | ICD-10-CM | POA: Insufficient documentation

## 2017-06-15 DIAGNOSIS — M4854XA Collapsed vertebra, not elsewhere classified, thoracic region, initial encounter for fracture: Secondary | ICD-10-CM | POA: Insufficient documentation

## 2017-06-15 DIAGNOSIS — M438X4 Other specified deforming dorsopathies, thoracic region: Secondary | ICD-10-CM | POA: Insufficient documentation

## 2017-06-16 ENCOUNTER — Ambulatory Visit
Admission: RE | Admit: 2017-06-16 | Discharge: 2017-06-16 | Disposition: A | Discharge status: Home / Self Care | Payer: Medicare HMO | Source: Ambulatory Visit | Attending: Student | Admitting: Student

## 2017-06-17 ENCOUNTER — Ambulatory Visit (INDEPENDENT_AMBULATORY_CARE_PROVIDER_SITE_OTHER): Payer: Medicare HMO | Admitting: *Deleted

## 2017-06-17 ENCOUNTER — Telehealth: Payer: Self-pay | Admitting: Internal Medicine

## 2017-06-17 DIAGNOSIS — Z5181 Encounter for therapeutic drug level monitoring: Secondary | ICD-10-CM

## 2017-06-17 DIAGNOSIS — Z86711 Personal history of pulmonary embolism: Secondary | ICD-10-CM | POA: Diagnosis not present

## 2017-06-17 LAB — POCT INR: INR: 1.9

## 2017-06-17 NOTE — Telephone Encounter (Signed)
PT is currently taking Warfarin (coumadin)  She is experiencing hair loss and also has a rash on leg Noticing other side effects mentioned with medication She is curious to see if she could possibly be switched to Eliquis or Xarelto  She will be mentioning these symptoms to coumadin nurse today  Please call to advise

## 2017-06-17 NOTE — Telephone Encounter (Signed)
Spoke w/ pt.  She reports that her hair is coming out in handfuls and she has significant fatigue.   On discussion, pt takes metoprolol 50 mg BID, but this was not on her med list.  He HR at home is typically in the 50s at rest, she reports this has been her normal for several years. Advised her that her sx are most likely r/t metoprolol and not coumadin; she will speak w/ her PCP regarding this. Pt does ask that we make Dr. Graciela Husbands aware and see if he is agreeable to switching her to another blood thinner instead of coumadin. Advised her that I will make him aware of her concerns and call her back w/ his recommendation.

## 2017-06-23 ENCOUNTER — Encounter: Payer: Self-pay | Admitting: Cardiology

## 2017-06-25 NOTE — Telephone Encounter (Signed)
I agree that BB is the more likely culprit for alopecia  If she also awants an alternative to warfarin, let her check with pharmacy re apixoban, dabigitran and rivora And see whichis the cheapest

## 2017-06-26 NOTE — Telephone Encounter (Signed)
Spoke w/ pt.  Advised of Dr. Odessa FlemingKlein's recommendation.  She is appreciative and will call her pharmacy, as she has to drive about an hour to get here for coumadin checks and she would like to not have to come in so often.  She will call back and let us know if she would indeed like to switch meds, and if so, which she would prefer.

## 2017-07-02 ENCOUNTER — Telehealth: Payer: Self-pay | Admitting: Internal Medicine

## 2017-07-02 NOTE — Telephone Encounter (Signed)
Pt states her insurance will pay for Eliquis, and she would like to be switched from coumadin. Please call.

## 2017-07-03 NOTE — Telephone Encounter (Signed)
Pt scheduled for INR check on 9/10. Will plan to switch to Eliquis 2.5mg  BID at that visit for long term prevention of VTE (hx of PE, DVT, and TIA - more than 2 yrs out from last VTE event). Pt is aware to keep Coumadin appt.

## 2017-07-06 ENCOUNTER — Ambulatory Visit (INDEPENDENT_AMBULATORY_CARE_PROVIDER_SITE_OTHER): Payer: Medicare HMO

## 2017-07-06 DIAGNOSIS — Z7901 Long term (current) use of anticoagulants: Secondary | ICD-10-CM | POA: Diagnosis not present

## 2017-07-06 DIAGNOSIS — Z86711 Personal history of pulmonary embolism: Secondary | ICD-10-CM | POA: Diagnosis not present

## 2017-07-06 DIAGNOSIS — Z5181 Encounter for therapeutic drug level monitoring: Secondary | ICD-10-CM

## 2017-07-06 LAB — POCT INR: INR: 2.7

## 2017-07-06 MED ORDER — APIXABAN 2.5 MG PO TABS: 2.5000 mg | ORAL_TABLET | Freq: Two times a day (BID) | ORAL | 3 refills | Status: DC

## 2017-07-06 NOTE — Telephone Encounter (Signed)
Please see anti-coag note for today. 

## 2017-07-07 ENCOUNTER — Encounter: Payer: Self-pay | Admitting: Cardiology

## 2017-08-05 ENCOUNTER — Other Ambulatory Visit (INDEPENDENT_AMBULATORY_CARE_PROVIDER_SITE_OTHER): Payer: Medicare HMO

## 2017-08-05 DIAGNOSIS — Z86711 Personal history of pulmonary embolism: Secondary | ICD-10-CM

## 2017-08-05 DIAGNOSIS — Z5181 Encounter for therapeutic drug level monitoring: Secondary | ICD-10-CM | POA: Diagnosis not present

## 2017-08-05 DIAGNOSIS — Z7901 Long term (current) use of anticoagulants: Secondary | ICD-10-CM

## 2017-08-06 LAB — BASIC METABOLIC PANEL
BUN / CREAT RATIO: 20 (ref 12–28)
BUN: 16 mg/dL (ref 8–27)
CO2: 18 mmol/L — ABNORMAL LOW (ref 20–29)
CREATININE: 0.81 mg/dL (ref 0.57–1.00)
Calcium: 8.7 mg/dL (ref 8.7–10.3)
Chloride: 106 mmol/L (ref 96–106)
GFR calc non Af Amer: 78 mL/min/{1.73_m2} (ref >59)
GFR, EST AFRICAN AMERICAN: 90 mL/min/{1.73_m2} (ref >59)
GLUCOSE: 77 mg/dL (ref 65–99)
Potassium: 5.9 mmol/L — ABNORMAL HIGH (ref 3.5–5.2)
SODIUM: 145 mmol/L — AB (ref 134–144)

## 2017-09-08 ENCOUNTER — Telehealth: Payer: Self-pay | Admitting: Cardiology

## 2017-09-08 ENCOUNTER — Ambulatory Visit (INDEPENDENT_AMBULATORY_CARE_PROVIDER_SITE_OTHER): Payer: Medicare HMO | Admitting: *Deleted

## 2017-09-08 DIAGNOSIS — I495 Sick sinus syndrome: Secondary | ICD-10-CM

## 2017-09-08 NOTE — Progress Notes (Signed)
Remote pacemaker transmission.   

## 2017-09-08 NOTE — Telephone Encounter (Signed)
Spoke with pt and reminded pt of remote transmission that is due today. Pt verbalized understanding.   

## 2017-09-09 LAB — CUP PACEART REMOTE DEVICE CHECK
Battery Impedance: 892 Ohm
Battery Voltage: 2.78 V
Brady Statistic AP VS Percent: 15 %
Brady Statistic AS VP Percent: 0 %
Implantable Lead Implant Date: 19970315
Implantable Lead Location: 753860
Implantable Lead Model: 4024
Lead Channel Impedance Value: 726 Ohm
Lead Channel Setting Pacing Amplitude: 2.5 V
Lead Channel Setting Pacing Amplitude: 2.5 V
Lead Channel Setting Sensing Sensitivity: 4 mV
MDC IDC LEAD IMPLANT DT: 19970315
MDC IDC LEAD LOCATION: 753859
MDC IDC MSMT BATTERY REMAINING LONGEVITY: 75 mo
MDC IDC MSMT LEADCHNL RA PACING THRESHOLD AMPLITUDE: 1.625 V
MDC IDC MSMT LEADCHNL RA PACING THRESHOLD PULSEWIDTH: 0.4 ms
MDC IDC MSMT LEADCHNL RV IMPEDANCE VALUE: 806 Ohm
MDC IDC PG IMPLANT DT: 20090210
MDC IDC SESS DTM: 20181113163530
MDC IDC SET LEADCHNL RV PACING PULSEWIDTH: 1 ms
MDC IDC STAT BRADY AP VP PERCENT: 0 %
MDC IDC STAT BRADY AS VS PERCENT: 84 %

## 2017-09-10 ENCOUNTER — Telehealth: Payer: Self-pay | Admitting: Pharmacist

## 2017-09-10 NOTE — Telephone Encounter (Signed)
[facility] for [facility] for upcoming colonoscopy (procedure date not scheduled). Pt is on apixaban 2.5mg  BID dosing for long-term prevention of VTE (hx PE, DVT, and TIA - over 2 years since last event). Pt has normal renal function, pt is cleared to hold apixaban for 1 day prior to procedure. [facility].

## 2017-09-11 ENCOUNTER — Encounter: Payer: Self-pay | Admitting: Cardiology

## 2017-12-08 ENCOUNTER — Encounter: Payer: Medicare HMO | Admitting: *Deleted

## 2017-12-08 ENCOUNTER — Telehealth: Payer: Self-pay | Admitting: Cardiology

## 2017-12-08 NOTE — Telephone Encounter (Signed)
Spoke with pt and reminded pt of remote transmission that is due today. Pt verbalized understanding.   

## 2017-12-09 ENCOUNTER — Encounter: Payer: Self-pay | Admitting: Cardiology

## 2017-12-14 ENCOUNTER — Ambulatory Visit (INDEPENDENT_AMBULATORY_CARE_PROVIDER_SITE_OTHER): Payer: Medicare HMO | Admitting: *Deleted

## 2017-12-14 DIAGNOSIS — I495 Sick sinus syndrome: Secondary | ICD-10-CM

## 2017-12-14 NOTE — Progress Notes (Signed)
Remote pacemaker transmission.   

## 2017-12-15 LAB — CUP PACEART REMOTE DEVICE CHECK
Battery Remaining Longevity: 68 mo
Brady Statistic AP VP Percent: 0 %
Brady Statistic AS VS Percent: 88 %
Date Time Interrogation Session: 20190218163629
Implantable Lead Implant Date: 19970315
Implantable Lead Location: 753859
Implantable Lead Model: 4524
Lead Channel Impedance Value: 874 Ohm
Lead Channel Pacing Threshold Amplitude: 1.625 V
Lead Channel Pacing Threshold Pulse Width: 0.4 ms
Lead Channel Setting Pacing Pulse Width: 1 ms
MDC IDC LEAD IMPLANT DT: 19970315
MDC IDC LEAD LOCATION: 753860
MDC IDC MSMT BATTERY IMPEDANCE: 1078 Ohm
MDC IDC MSMT BATTERY VOLTAGE: 2.78 V
MDC IDC MSMT LEADCHNL RA IMPEDANCE VALUE: 739 Ohm
MDC IDC PG IMPLANT DT: 20090210
MDC IDC SET LEADCHNL RA PACING AMPLITUDE: 2.5 V
MDC IDC SET LEADCHNL RV PACING AMPLITUDE: 2.5 V
MDC IDC SET LEADCHNL RV SENSING SENSITIVITY: 4 mV
MDC IDC STAT BRADY AP VS PERCENT: 12 %
MDC IDC STAT BRADY AS VP PERCENT: 0 %

## 2017-12-16 ENCOUNTER — Encounter: Payer: Self-pay | Admitting: Cardiology

## 2018-03-15 ENCOUNTER — Ambulatory Visit (INDEPENDENT_AMBULATORY_CARE_PROVIDER_SITE_OTHER): Payer: Medicare HMO | Admitting: *Deleted

## 2018-03-15 ENCOUNTER — Telehealth: Payer: Self-pay | Admitting: Cardiology

## 2018-03-15 DIAGNOSIS — I495 Sick sinus syndrome: Secondary | ICD-10-CM | POA: Diagnosis not present

## 2018-03-15 NOTE — Telephone Encounter (Signed)
Spoke with pt and reminded pt of remote transmission that is due today. Pt verbalized understanding.   

## 2018-03-18 ENCOUNTER — Encounter: Payer: Self-pay | Admitting: Cardiology

## 2018-03-18 LAB — CUP PACEART REMOTE DEVICE CHECK
Battery Voltage: 2.78 V
Brady Statistic AP VP Percent: 0 %
Brady Statistic AP VS Percent: 11 %
Brady Statistic AS VP Percent: 0 %
Brady Statistic AS VS Percent: 88 %
Implantable Lead Implant Date: 19970315
Implantable Lead Location: 753860
Implantable Pulse Generator Implant Date: 20090210
Lead Channel Impedance Value: 663 Ohm
Lead Channel Impedance Value: 853 Ohm
Lead Channel Pacing Threshold Amplitude: 1.5 V
Lead Channel Pacing Threshold Pulse Width: 0.4 ms
Lead Channel Setting Pacing Amplitude: 2.5 V
Lead Channel Setting Pacing Pulse Width: 1 ms
Lead Channel Setting Sensing Sensitivity: 4 mV
MDC IDC LEAD IMPLANT DT: 19970315
MDC IDC LEAD LOCATION: 753859
MDC IDC MSMT BATTERY IMPEDANCE: 997 Ohm
MDC IDC MSMT BATTERY REMAINING LONGEVITY: 71 mo
MDC IDC SESS DTM: 20190522181652
MDC IDC SET LEADCHNL RA PACING AMPLITUDE: 2.5 V

## 2018-03-18 NOTE — Progress Notes (Signed)
Remote pacemaker transmission.   

## 2018-05-04 ENCOUNTER — Ambulatory Visit (INDEPENDENT_AMBULATORY_CARE_PROVIDER_SITE_OTHER): Payer: Medicare HMO | Admitting: Internal Medicine

## 2018-05-04 ENCOUNTER — Encounter: Payer: Self-pay | Admitting: Internal Medicine

## 2018-05-04 VITALS — BP 120/90 | HR 57 | Ht 60.0 in | Wt 233.5 lb

## 2018-05-04 DIAGNOSIS — Z950 Presence of cardiac pacemaker: Secondary | ICD-10-CM | POA: Diagnosis not present

## 2018-05-04 DIAGNOSIS — Z7901 Long term (current) use of anticoagulants: Secondary | ICD-10-CM | POA: Diagnosis not present

## 2018-05-04 DIAGNOSIS — I440 Atrioventricular block, first degree: Secondary | ICD-10-CM | POA: Diagnosis not present

## 2018-05-04 DIAGNOSIS — I495 Sick sinus syndrome: Secondary | ICD-10-CM

## 2018-05-04 LAB — CUP PACEART INCLINIC DEVICE CHECK
Battery Remaining Longevity: 70 mo
Brady Statistic AP VP Percent: 0 %
Brady Statistic AP VS Percent: 11 %
Brady Statistic AS VP Percent: 0 %
Brady Statistic AS VS Percent: 88 %
Date Time Interrogation Session: 20190709135903
Implantable Lead Implant Date: 19970315
Implantable Lead Location: 753859
Implantable Lead Location: 753860
Implantable Lead Model: 4524
Implantable Pulse Generator Implant Date: 20090210
Lead Channel Impedance Value: 833 Ohm
Lead Channel Pacing Threshold Amplitude: 1 V
Lead Channel Pacing Threshold Amplitude: 1.25 V
Lead Channel Pacing Threshold Pulse Width: 0.46 ms
Lead Channel Pacing Threshold Pulse Width: 1 ms
Lead Channel Sensing Intrinsic Amplitude: 11.2 mV
Lead Channel Sensing Intrinsic Amplitude: 2 mV
Lead Channel Setting Pacing Amplitude: 2.5 V
Lead Channel Setting Sensing Sensitivity: 4 mV
MDC IDC LEAD IMPLANT DT: 19970315
MDC IDC MSMT BATTERY IMPEDANCE: 1025 Ohm
MDC IDC MSMT BATTERY VOLTAGE: 2.78 V
MDC IDC MSMT LEADCHNL RA IMPEDANCE VALUE: 713 Ohm
MDC IDC SET LEADCHNL RA PACING AMPLITUDE: 2.5 V
MDC IDC SET LEADCHNL RV PACING PULSEWIDTH: 1 ms

## 2018-05-04 NOTE — Progress Notes (Signed)
Patient Care Team: Danielle Dessatner, Shana P, MD as PCP - General (Internal Medicine)   HPI  Susan Howell is a 62 y.o. female Seen in followup for a pacemaker inserted for sinus node dysfunction.  She has a history of hypertension.    She has a history of recurrent pulmonary embolism  She is tolerating her Coumadin without bleeding.  She has intercurrently been at [facility]. 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 Cultures were positive for an unusual strep species as well as Actinomyces; following that she had fevers and chills for some time.  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  She is also put on 40 pounds concurrent with prednisone therapy no recent fevers or chills  Denies chest pain.  No edema.  Past Medical History:  Diagnosis Date  . Acute stress reaction    Husband passed 2/2 car falling on him that he was working on 2014   . Anxiety   . Arthritis   . Cystitis   . Depression   . Fibromyalgia   . H/O diastolic dysfunction    a. Echo 12/13/12: EF 65-70%, G2DD, pulm press 38mmHg. b. Echo 02/03/13: 50-55%, no WMA, lef ventricular diastolic function nl, pulm arteries w/in nl range, mild LA dilation.    Susan Howell. Headache(784.0)   . History of urinary tract infection   . HTN (hypertension)   . Lung nodules   . Motility disorder, esophageal   . Obesity   . Obesity   . Orthostatic hypotension   . Pacemaker 2009   a. Dr. Ladona Ridgelaylor. b. Medtronic, SN# [id number] H, est longevity 10.5 years  . Palpitations   . Pulmonary embolism (HCC)    a. 2/2 gastric bypass, 1979. b. 12/12/2012  . Sinoatrial node dysfunction (HCC) 2009  . Sleep apnea    a. no  cpap now uses 02 2L at night  . Thyroid nodule     Past Surgical History:  Procedure Laterality Date  . ABDOMINAL HYSTERECTOMY    . ABDOMINAL SURGERY     muscle deteriorated in abdomen  . APPENDECTOMY    . CHOLECYSTECTOMY    . ECTOPIC PREGNANCY SURGERY    . EUS N/A 03/03/2013   Procedure: UPPER ENDOSCOPIC ULTRASOUND (EUS) LINEAR;  Surgeon: Rachael Feeaniel P Jacobs, MD;  Location: [facility];  Service: Endoscopy;  Laterality: N/A;  . FOOT SURGERY     both feet, and ankles  . GASTRIC BYPASS    . KNEE SURGERY Bilateral   . LAMINECTOMY    . OPEN REDUCTION INTERNAL FIXATION (ORIF) METACARPAL Left 03/22/2015   Procedure: OPEN REDUCTION INTERNAL FIXATION (ORIF) LEFT SMALL FINGER;  Surgeon: Knute NeuHarrill Coley, MD;  Location: [facility];  Service: Orthopedics;  Laterality: Left;  . ORIF ANKLE FRACTURE Left 03/22/2015   Procedure: OPEN REDUCTION INTERNAL FIXATION (ORIF) BIMAL ANKLE FRACTURE;  Surgeon: Sheral Apleyimothy D Murphy, MD;  Location: [facility];  Service: Orthopedics;  Laterality: Left;  . PACEMAKER PLACEMENT     x2, pacemaker leads replaced  . RIB RESECTION    . SPLENECTOMY, TOTAL     Complicated subdiaphragmatic abscess as  a consequent gastric bypass  . STOMACH SURGERY     "2/3 of stomach removed"    Current Outpatient Medications  Medication Sig Dispense Refill  . apixaban (ELIQUIS) 2.5 MG TABS tablet Take 1 tablet (2.5 mg total) by mouth 2 (two) times daily. 180 tablet 3  . clonazePAM (KLONOPIN) 0.25 MG disintegrating tablet Take 0.25 mg by mouth 2 (two) times daily.    Susan Howell enoxaparin (LOVENOX) 80 MG/0.8ML injection Inject 0.8 mLs (80 mg total) into the skin every 12 (twelve) hours. As instructed by [facility] 20 Syringe 0  . fluticasone (FLONASE) 50 MCG/ACT nasal spray Place 2 sprays into both nostrils daily as needed for allergies or rhinitis.     . metoprolol tartrate (LOPRESSOR) 50 MG tablet Take 50 mg by mouth 2 (two) times daily.    Susan Howell morphine (MSIR) 30 MG tablet Take 30 mg by mouth as directed. 15  mg at night and 30 mg in the morning    . Multiple Vitamin (MULTIVITAMIN) tablet Take 1 tablet by mouth daily.    Susan Howell nystatin (MYCOSTATIN) 100000 UNIT/ML suspension Take 5 mLs by mouth as directed.    . ondansetron (ZOFRAN) 4 MG tablet Take 1 tablet (4 mg total) by mouth every 8 (eight) hours as needed for nausea or vomiting. 20 tablet 0  . Oxycodone HCl 10 MG TABS Take 10 mg by mouth 3 (three) times daily as needed.    . traZODone (DESYREL) 150 MG tablet Take 150 mg by mouth at bedtime.     No current facility-administered medications for this visit.     Allergies  Allergen Reactions  . Iodine Other (See Comments)    seizures  . Labetalol Hcl Hypertension  . Labetalol Hcl Other (See Comments)  . Tetanus Toxoid, Adsorbed Swelling    Fever, flushing and hardening of the injection site  . Bactrim [Sulfamethoxazole-Trimethoprim] Other (See Comments)    R/T Coumadin  . Eszopiclone Other (See Comments)    REACTION: NIGHT MARES  . Iohexol Nausea And Vomiting and Other (See Comments)     Desc: NAUSEA,VOMITING & SZ S/P CONTRAST INJ 30 YRS AGO// OK W/ PRE MEDS, BENADRYL & SOLUMEDROL TODAY//A.C., Onset Date: [id number]   . Penicillins Nausea And Vomiting    Cannot take tablets by mouth; IV is fine.   . Smallpox Vaccine Other (See Comments)    Swelling at injection site, fever  . Tetanus Toxoids Swelling and Other (See Comments)    Fever, flushing and hardening of the injection site  . Topamax Other (See Comments)    Confusion, high blood pressure.   . Vancomycin Nausea Only    Severe chills  . Albuterol     Inhaler made patient heart race  . Amitriptyline     sedation  . Ceftriaxone     Rash  . Gabapentin     Nightmares, mood swings, nausea  . Latex   . Nitrofuran Derivatives     Loss of appetite  . Tape     Other reaction(s): RASH  . Topiramate     Other reaction(s): OTHER  . Aliskiren Other (See Comments)    Caused SYNCOPE  . Aliskiren Fumarate Other (See Comments)    Caused  SYNCOPE  . Amoxicillin Nausea And Vomiting    Cannot take tablets by mouth; IV is fine.   . Codeine Itching    Review of Systems negative except from HPI and PMH  Physical Exam BP 120/90 (BP Location: Left Arm, Patient Position: Sitting, Cuff Size:  Large)   Pulse (!) 57   Ht 5' (1.524 m)   Wt 233 lb 8 oz (105.9 kg)   BMI 45.60 kg/m  Well developed Morbidly obese woman in no acute distress HENT normal Neck supple with JVP-flat Clear Regular rate and rhythm, no murmurs or gallops Abd-soft with active BS No Clubbing cyanosis edema Skin-warm and dry A & Oriented  Grossly normal sensory and motor function   ECG demonstrates sinus rhythm at 57 Intervals 32/12/45 Right bundle branch block   assessment and plan  Sinus node dysfunction  Low blood pressure  1AVB   Pacemaker-Medtronic The patient's device was interrogated.  The information was reviewed. No changes were made in the programming.     Recurrent pulmonary embolism on warfarin     They asked about the appropriate long-term dose of Eliquis.  Reviewing the literature, after 6 months is 2.5 mg twice daily.  Her shortness of breath is likely multifactorial.  Likely predominant portion of this is obesity hypoventilation.  She has not had LV function assessment immunotherapies or shortly associate with heart failure.  Repeat echocardiogram might be of value.  Will wait and see how therapies go at [facility]  We spent more than 50% of our >25 min visit in face to face counseling regarding the above

## 2018-05-04 NOTE — Patient Instructions (Signed)
Medication Instructions: - Your physician recommends that you continue on your current medications as directed. Please refer to the Current Medication list given to you today.  Labwork: - none ordered  Procedures/Testing: - none ordered  Follow-Up: - Remote monitoring is used to monitor your Pacemaker of ICD from home. This monitoring reduces the number of office visits required to check your device to one time per year. It allows us to keep an eye on the functioning of your device to ensure it is working properly. You are scheduled for a device check from home on 06/14/18. You may send your transmission at any time that day. If you have a wireless device, the transmission will be sent automatically. After your physician reviews your transmission, you will receive a postcard with your next transmission date.  - Your physician wants you to follow-up in: 1 year with Dr. Graciela HusbandsKlein. You will receive a reminder letter in the mail two months in advance. If you don't receive a letter, please call our office to schedule the follow-up appointment.   Any Additional Special Instructions Will Be Listed Below (If Applicable).     If you need a refill on your cardiac medications before your next appointment, please call your pharmacy.

## 2018-06-14 ENCOUNTER — Ambulatory Visit (INDEPENDENT_AMBULATORY_CARE_PROVIDER_SITE_OTHER): Payer: Medicare HMO | Admitting: *Deleted

## 2018-06-14 DIAGNOSIS — I495 Sick sinus syndrome: Secondary | ICD-10-CM | POA: Diagnosis not present

## 2018-06-15 ENCOUNTER — Telehealth: Payer: Self-pay | Admitting: Cardiology

## 2018-06-15 NOTE — Telephone Encounter (Signed)
Spoke with pt and reminded pt of remote transmission that is due today. Pt verbalized understanding.   

## 2018-06-16 ENCOUNTER — Encounter: Payer: Self-pay | Admitting: Cardiology

## 2018-06-16 NOTE — Progress Notes (Signed)
Remote pacemaker transmission.   

## 2018-07-09 LAB — CUP PACEART REMOTE DEVICE CHECK
Battery Remaining Longevity: 66 mo
Brady Statistic AP VP Percent: 0 %
Brady Statistic AS VP Percent: 0 %
Brady Statistic AS VS Percent: 95 %
Date Time Interrogation Session: 20190821164214
Implantable Lead Implant Date: 19970315
Implantable Lead Implant Date: 19970315
Implantable Lead Location: 753859
Implantable Lead Location: 753860
Implantable Lead Model: 4024
Lead Channel Impedance Value: 726 Ohm
Lead Channel Impedance Value: 902 Ohm
Lead Channel Pacing Threshold Pulse Width: 0.4 ms
Lead Channel Setting Pacing Amplitude: 2.5 V
Lead Channel Setting Sensing Sensitivity: 4 mV
MDC IDC MSMT BATTERY IMPEDANCE: 1133 Ohm
MDC IDC MSMT BATTERY VOLTAGE: 2.77 V
MDC IDC MSMT LEADCHNL RA PACING THRESHOLD AMPLITUDE: 1.5 V
MDC IDC MSMT LEADCHNL RA SENSING INTR AMPL: 1 mV
MDC IDC MSMT LEADCHNL RV SENSING INTR AMPL: 11.2 mV
MDC IDC PG IMPLANT DT: 20090210
MDC IDC SET LEADCHNL RA PACING AMPLITUDE: 2.5 V
MDC IDC SET LEADCHNL RV PACING PULSEWIDTH: 1 ms
MDC IDC STAT BRADY AP VS PERCENT: 4 %

## 2018-07-19 ENCOUNTER — Other Ambulatory Visit: Payer: Self-pay | Admitting: Internal Medicine

## 2018-09-13 ENCOUNTER — Ambulatory Visit (INDEPENDENT_AMBULATORY_CARE_PROVIDER_SITE_OTHER): Payer: Medicare HMO

## 2018-09-13 ENCOUNTER — Telehealth: Payer: Self-pay

## 2018-09-13 DIAGNOSIS — I440 Atrioventricular block, first degree: Secondary | ICD-10-CM

## 2018-09-13 DIAGNOSIS — I495 Sick sinus syndrome: Secondary | ICD-10-CM

## 2018-09-13 NOTE — Telephone Encounter (Signed)
LMOVM reminding pt to send remote transmission.   

## 2018-09-14 NOTE — Progress Notes (Signed)
Remote pacemaker transmission.   

## 2018-09-16 ENCOUNTER — Encounter: Payer: Self-pay | Admitting: Cardiology

## 2018-11-09 LAB — CUP PACEART REMOTE DEVICE CHECK
Battery Impedance: 1241 Ohm
Battery Voltage: 2.77 V
Brady Statistic AP VS Percent: 8 %
Brady Statistic AS VP Percent: 0 %
Implantable Lead Implant Date: 19970315
Implantable Lead Location: 753860
Implantable Lead Model: 4024
Implantable Lead Model: 4524
Implantable Pulse Generator Implant Date: 20090210
Lead Channel Impedance Value: 740 Ohm
Lead Channel Impedance Value: 920 Ohm
Lead Channel Pacing Threshold Amplitude: 1.5 V
Lead Channel Pacing Threshold Pulse Width: 0.4 ms
Lead Channel Setting Pacing Amplitude: 2.5 V
MDC IDC LEAD IMPLANT DT: 19970315
MDC IDC LEAD LOCATION: 753859
MDC IDC MSMT BATTERY REMAINING LONGEVITY: 62 mo
MDC IDC SESS DTM: 20191118162546
MDC IDC SET LEADCHNL RA PACING AMPLITUDE: 2.5 V
MDC IDC SET LEADCHNL RV PACING PULSEWIDTH: 1 ms
MDC IDC SET LEADCHNL RV SENSING SENSITIVITY: 4 mV
MDC IDC STAT BRADY AP VP PERCENT: 0 %
MDC IDC STAT BRADY AS VS PERCENT: 92 %

## 2018-12-13 ENCOUNTER — Ambulatory Visit (INDEPENDENT_AMBULATORY_CARE_PROVIDER_SITE_OTHER): Payer: Medicare HMO

## 2018-12-13 DIAGNOSIS — I495 Sick sinus syndrome: Secondary | ICD-10-CM | POA: Diagnosis not present

## 2018-12-15 ENCOUNTER — Ambulatory Visit (INDEPENDENT_AMBULATORY_CARE_PROVIDER_SITE_OTHER): Payer: Medicare HMO

## 2018-12-15 ENCOUNTER — Telehealth: Payer: Self-pay

## 2018-12-15 DIAGNOSIS — I495 Sick sinus syndrome: Secondary | ICD-10-CM

## 2018-12-15 NOTE — Telephone Encounter (Signed)
Spoke with patient to remind of missed remote transmission 

## 2018-12-17 LAB — CUP PACEART REMOTE DEVICE CHECK
Battery Remaining Longevity: 62 mo
Brady Statistic AP VP Percent: 0 %
Brady Statistic AS VS Percent: 91 %
Date Time Interrogation Session: 20200219184240
Implantable Lead Implant Date: 19970315
Implantable Lead Location: 753859
Implantable Lead Model: 4524
Implantable Pulse Generator Implant Date: 20090210
Lead Channel Impedance Value: 868 Ohm
Lead Channel Pacing Threshold Amplitude: 1.375 V
Lead Channel Pacing Threshold Pulse Width: 0.4 ms
Lead Channel Setting Sensing Sensitivity: 4 mV
MDC IDC LEAD IMPLANT DT: 19970315
MDC IDC LEAD LOCATION: 753860
MDC IDC MSMT BATTERY IMPEDANCE: 1214 Ohm
MDC IDC MSMT BATTERY VOLTAGE: 2.77 V
MDC IDC MSMT LEADCHNL RA IMPEDANCE VALUE: 674 Ohm
MDC IDC SET LEADCHNL RA PACING AMPLITUDE: 2.5 V
MDC IDC SET LEADCHNL RV PACING AMPLITUDE: 2.5 V
MDC IDC SET LEADCHNL RV PACING PULSEWIDTH: 1 ms
MDC IDC STAT BRADY AP VS PERCENT: 8 %
MDC IDC STAT BRADY AS VP PERCENT: 0 %

## 2018-12-22 NOTE — Progress Notes (Signed)
Remote pacemaker transmission.   

## 2018-12-23 NOTE — Progress Notes (Signed)
Remote pacemaker transmission.   

## 2018-12-24 ENCOUNTER — Encounter: Payer: Self-pay | Admitting: Cardiology

## 2019-03-16 ENCOUNTER — Other Ambulatory Visit: Payer: Self-pay

## 2019-03-16 ENCOUNTER — Encounter: Payer: Medicare HMO | Admitting: *Deleted

## 2019-03-17 ENCOUNTER — Telehealth: Payer: Self-pay

## 2019-03-17 NOTE — Telephone Encounter (Signed)
Left message for patient to remind of missed remote transmission.  

## 2019-03-25 ENCOUNTER — Encounter: Payer: Self-pay | Admitting: Cardiology

## 2019-04-01 ENCOUNTER — Ambulatory Visit (INDEPENDENT_AMBULATORY_CARE_PROVIDER_SITE_OTHER): Payer: Medicare HMO | Admitting: *Deleted

## 2019-04-01 DIAGNOSIS — I495 Sick sinus syndrome: Secondary | ICD-10-CM

## 2019-04-04 LAB — CUP PACEART REMOTE DEVICE CHECK
Battery Impedance: 1323 Ohm
Battery Remaining Longevity: 59 mo
Battery Voltage: 2.77 V
Brady Statistic AP VP Percent: 0 %
Brady Statistic AP VS Percent: 8 %
Brady Statistic AS VP Percent: 0 %
Brady Statistic AS VS Percent: 91 %
Date Time Interrogation Session: 20200605172909
Implantable Lead Implant Date: 19970315
Implantable Lead Implant Date: 19970315
Implantable Lead Location: 753859
Implantable Lead Location: 753860
Implantable Lead Model: 4024
Implantable Lead Model: 4524
Implantable Pulse Generator Implant Date: 20090210
Lead Channel Impedance Value: 740 Ohm
Lead Channel Impedance Value: 989 Ohm
Lead Channel Pacing Threshold Amplitude: 1.375 V
Lead Channel Pacing Threshold Pulse Width: 0.4 ms
Lead Channel Setting Pacing Amplitude: 2.5 V
Lead Channel Setting Pacing Amplitude: 2.5 V
Lead Channel Setting Pacing Pulse Width: 1 ms
Lead Channel Setting Sensing Sensitivity: 5.6 mV

## 2019-04-08 ENCOUNTER — Encounter: Payer: Self-pay | Admitting: Cardiology

## 2019-04-08 NOTE — Progress Notes (Signed)
Remote pacemaker transmission.   

## 2019-07-01 ENCOUNTER — Ambulatory Visit (INDEPENDENT_AMBULATORY_CARE_PROVIDER_SITE_OTHER): Payer: Medicare HMO | Admitting: *Deleted

## 2019-07-01 DIAGNOSIS — I495 Sick sinus syndrome: Secondary | ICD-10-CM | POA: Diagnosis not present

## 2019-07-01 LAB — CUP PACEART REMOTE DEVICE CHECK
Battery Impedance: 1405 Ohm
Battery Remaining Longevity: 56 mo
Battery Voltage: 2.77 V
Brady Statistic AP VP Percent: 0 %
Brady Statistic AP VS Percent: 7 %
Brady Statistic AS VP Percent: 0 %
Brady Statistic AS VS Percent: 92 %
Date Time Interrogation Session: 20200904162519
Implantable Lead Implant Date: 19970315
Implantable Lead Implant Date: 19970315
Implantable Lead Location: 753859
Implantable Lead Location: 753860
Implantable Lead Model: 4024
Implantable Lead Model: 4524
Implantable Pulse Generator Implant Date: 20090210
Lead Channel Impedance Value: 725 Ohm
Lead Channel Impedance Value: 950 Ohm
Lead Channel Pacing Threshold Amplitude: 1.25 V
Lead Channel Pacing Threshold Pulse Width: 0.4 ms
Lead Channel Setting Pacing Amplitude: 2.5 V
Lead Channel Setting Pacing Amplitude: 2.5 V
Lead Channel Setting Pacing Pulse Width: 1 ms
Lead Channel Setting Sensing Sensitivity: 4 mV

## 2019-07-13 ENCOUNTER — Encounter: Payer: Self-pay | Admitting: Cardiology

## 2019-07-13 NOTE — Progress Notes (Signed)
Remote pacemaker transmission.   

## 2019-09-30 ENCOUNTER — Ambulatory Visit (INDEPENDENT_AMBULATORY_CARE_PROVIDER_SITE_OTHER): Payer: Medicare HMO | Admitting: *Deleted

## 2019-09-30 DIAGNOSIS — I495 Sick sinus syndrome: Secondary | ICD-10-CM | POA: Diagnosis not present

## 2019-10-01 LAB — CUP PACEART REMOTE DEVICE CHECK
Battery Impedance: 1435 Ohm
Battery Remaining Longevity: 55 mo
Battery Voltage: 2.77 V
Brady Statistic AP VP Percent: 0 %
Brady Statistic AP VS Percent: 6 %
Brady Statistic AS VP Percent: 0 %
Brady Statistic AS VS Percent: 93 %
Date Time Interrogation Session: 20201204140658
Implantable Lead Implant Date: 19970315
Implantable Lead Implant Date: 19970315
Implantable Lead Location: 753859
Implantable Lead Location: 753860
Implantable Lead Model: 4024
Implantable Lead Model: 4524
Implantable Pulse Generator Implant Date: 20090210
Lead Channel Impedance Value: 1018 Ohm
Lead Channel Impedance Value: 783 Ohm
Lead Channel Pacing Threshold Amplitude: 1.375 V
Lead Channel Pacing Threshold Pulse Width: 0.4 ms
Lead Channel Setting Pacing Amplitude: 2.5 V
Lead Channel Setting Pacing Amplitude: 2.5 V
Lead Channel Setting Pacing Pulse Width: 1 ms
Lead Channel Setting Sensing Sensitivity: 5.6 mV

## 2019-12-30 ENCOUNTER — Ambulatory Visit (INDEPENDENT_AMBULATORY_CARE_PROVIDER_SITE_OTHER): Payer: Medicare HMO | Admitting: *Deleted

## 2019-12-30 DIAGNOSIS — I495 Sick sinus syndrome: Secondary | ICD-10-CM | POA: Diagnosis not present

## 2020-01-02 LAB — CUP PACEART REMOTE DEVICE CHECK
Battery Impedance: 1633 Ohm
Battery Remaining Longevity: 49 mo
Battery Voltage: 2.76 V
Brady Statistic AP VP Percent: 0 %
Brady Statistic AP VS Percent: 5 %
Brady Statistic AS VP Percent: 0 %
Brady Statistic AS VS Percent: 94 %
Date Time Interrogation Session: 20210308114541
Implantable Lead Implant Date: 19970315
Implantable Lead Implant Date: 19970315
Implantable Lead Location: 753859
Implantable Lead Location: 753860
Implantable Lead Model: 4024
Implantable Lead Model: 4524
Implantable Pulse Generator Implant Date: 20090210
Lead Channel Impedance Value: 726 Ohm
Lead Channel Impedance Value: 971 Ohm
Lead Channel Pacing Threshold Amplitude: 1.375 V
Lead Channel Pacing Threshold Pulse Width: 0.4 ms
Lead Channel Setting Pacing Amplitude: 2.5 V
Lead Channel Setting Pacing Amplitude: 2.5 V
Lead Channel Setting Pacing Pulse Width: 1 ms
Lead Channel Setting Sensing Sensitivity: 5.6 mV

## 2020-01-03 NOTE — Progress Notes (Signed)
PPM Remote  

## 2020-03-30 ENCOUNTER — Telehealth: Payer: Self-pay

## 2020-03-30 NOTE — Telephone Encounter (Signed)
Left message for patient to remind of missed remote transmission.  

## 2020-04-18 ENCOUNTER — Ambulatory Visit (INDEPENDENT_AMBULATORY_CARE_PROVIDER_SITE_OTHER): Payer: Medicare HMO | Admitting: *Deleted

## 2020-04-18 DIAGNOSIS — I495 Sick sinus syndrome: Secondary | ICD-10-CM

## 2020-04-18 LAB — CUP PACEART REMOTE DEVICE CHECK
Battery Impedance: 1720 Ohm
Battery Remaining Longevity: 48 mo
Battery Voltage: 2.76 V
Brady Statistic AP VP Percent: 0 %
Brady Statistic AP VS Percent: 5 %
Brady Statistic AS VP Percent: 0 %
Brady Statistic AS VS Percent: 95 %
Date Time Interrogation Session: 20210623132411
Implantable Lead Implant Date: 19970315
Implantable Lead Implant Date: 19970315
Implantable Lead Location: 753859
Implantable Lead Location: 753860
Implantable Lead Model: 4024
Implantable Lead Model: 4524
Implantable Pulse Generator Implant Date: 20090210
Lead Channel Impedance Value: 1081 Ohm
Lead Channel Impedance Value: 800 Ohm
Lead Channel Pacing Threshold Amplitude: 1.5 V
Lead Channel Pacing Threshold Pulse Width: 0.4 ms
Lead Channel Setting Pacing Amplitude: 2.5 V
Lead Channel Setting Pacing Amplitude: 2.5 V
Lead Channel Setting Pacing Pulse Width: 1 ms
Lead Channel Setting Sensing Sensitivity: 5.6 mV

## 2020-04-19 NOTE — Progress Notes (Signed)
Remote pacemaker transmission.   

## 2020-07-18 ENCOUNTER — Ambulatory Visit (INDEPENDENT_AMBULATORY_CARE_PROVIDER_SITE_OTHER): Payer: Medicare HMO | Admitting: *Deleted

## 2020-07-18 DIAGNOSIS — I440 Atrioventricular block, first degree: Secondary | ICD-10-CM

## 2020-07-20 LAB — CUP PACEART REMOTE DEVICE CHECK
Battery Impedance: 1806 Ohm
Battery Remaining Longevity: 45 mo
Battery Voltage: 2.76 V
Brady Statistic AP VP Percent: 0 %
Brady Statistic AP VS Percent: 4 %
Brady Statistic AS VP Percent: 0 %
Brady Statistic AS VS Percent: 95 %
Date Time Interrogation Session: 20210923111330
Implantable Lead Implant Date: 19970315
Implantable Lead Implant Date: 19970315
Implantable Lead Location: 753859
Implantable Lead Location: 753860
Implantable Lead Model: 4024
Implantable Lead Model: 4524
Implantable Pulse Generator Implant Date: 20090210
Lead Channel Impedance Value: 1071 Ohm
Lead Channel Impedance Value: 769 Ohm
Lead Channel Pacing Threshold Amplitude: 1.625 V
Lead Channel Pacing Threshold Pulse Width: 0.4 ms
Lead Channel Setting Pacing Amplitude: 2.5 V
Lead Channel Setting Pacing Amplitude: 2.5 V
Lead Channel Setting Pacing Pulse Width: 1 ms
Lead Channel Setting Sensing Sensitivity: 5.6 mV

## 2020-07-20 NOTE — Progress Notes (Signed)
Remote pacemaker transmission.   

## 2020-10-17 ENCOUNTER — Ambulatory Visit (INDEPENDENT_AMBULATORY_CARE_PROVIDER_SITE_OTHER): Payer: Medicare HMO

## 2020-10-17 DIAGNOSIS — I440 Atrioventricular block, first degree: Secondary | ICD-10-CM | POA: Diagnosis not present

## 2020-10-19 LAB — CUP PACEART REMOTE DEVICE CHECK
Battery Impedance: 1895 Ohm
Battery Remaining Longevity: 43 mo
Battery Voltage: 2.75 V
Brady Statistic AP VP Percent: 0 %
Brady Statistic AP VS Percent: 4 %
Brady Statistic AS VP Percent: 1 %
Brady Statistic AS VS Percent: 95 %
Date Time Interrogation Session: 20211223114613
Implantable Lead Implant Date: 19970315
Implantable Lead Implant Date: 19970315
Implantable Lead Location: 753859
Implantable Lead Location: 753860
Implantable Lead Model: 4024
Implantable Lead Model: 4524
Implantable Pulse Generator Implant Date: 20090210
Lead Channel Impedance Value: 1115 Ohm
Lead Channel Impedance Value: 800 Ohm
Lead Channel Pacing Threshold Amplitude: 1.625 V
Lead Channel Pacing Threshold Pulse Width: 0.4 ms
Lead Channel Setting Pacing Amplitude: 2.5 V
Lead Channel Setting Pacing Amplitude: 2.5 V
Lead Channel Setting Pacing Pulse Width: 1 ms
Lead Channel Setting Sensing Sensitivity: 5.6 mV

## 2020-10-31 NOTE — Progress Notes (Signed)
Remote pacemaker transmission.   

## 2021-01-16 ENCOUNTER — Ambulatory Visit (INDEPENDENT_AMBULATORY_CARE_PROVIDER_SITE_OTHER): Payer: Medicare HMO

## 2021-01-16 DIAGNOSIS — I495 Sick sinus syndrome: Secondary | ICD-10-CM

## 2021-01-17 LAB — CUP PACEART REMOTE DEVICE CHECK
Battery Impedance: 1956 Ohm
Battery Remaining Longevity: 41 mo
Battery Voltage: 2.76 V
Brady Statistic AP VP Percent: 0 %
Brady Statistic AP VS Percent: 4 %
Brady Statistic AS VP Percent: 1 %
Brady Statistic AS VS Percent: 96 %
Date Time Interrogation Session: 20220324093124
Implantable Lead Implant Date: 19970315
Implantable Lead Implant Date: 19970315
Implantable Lead Location: 753859
Implantable Lead Location: 753860
Implantable Lead Model: 4024
Implantable Lead Model: 4524
Implantable Pulse Generator Implant Date: 20090210
Lead Channel Impedance Value: 689 Ohm
Lead Channel Impedance Value: 891 Ohm
Lead Channel Pacing Threshold Amplitude: 1.625 V
Lead Channel Pacing Threshold Pulse Width: 0.4 ms
Lead Channel Setting Pacing Amplitude: 2.5 V
Lead Channel Setting Pacing Amplitude: 2.5 V
Lead Channel Setting Pacing Pulse Width: 1 ms
Lead Channel Setting Sensing Sensitivity: 5.6 mV

## 2021-01-25 NOTE — Progress Notes (Signed)
Remote pacemaker transmission.   

## 2021-04-09 ENCOUNTER — Encounter: Payer: Medicare HMO | Admitting: Internal Medicine

## 2021-04-17 ENCOUNTER — Ambulatory Visit (INDEPENDENT_AMBULATORY_CARE_PROVIDER_SITE_OTHER): Payer: Medicare HMO

## 2021-04-17 DIAGNOSIS — I495 Sick sinus syndrome: Secondary | ICD-10-CM

## 2021-04-19 LAB — CUP PACEART REMOTE DEVICE CHECK
Battery Impedance: 2136 Ohm
Battery Remaining Longevity: 38 mo
Battery Voltage: 2.75 V
Brady Statistic AP VP Percent: 0 %
Brady Statistic AP VS Percent: 3 %
Brady Statistic AS VP Percent: 1 %
Brady Statistic AS VS Percent: 96 %
Date Time Interrogation Session: 20220623151909
Implantable Lead Implant Date: 19970315
Implantable Lead Implant Date: 19970315
Implantable Lead Location: 753859
Implantable Lead Location: 753860
Implantable Lead Model: 4024
Implantable Lead Model: 4524
Implantable Pulse Generator Implant Date: 20090210
Lead Channel Impedance Value: 1016 Ohm
Lead Channel Impedance Value: 742 Ohm
Lead Channel Pacing Threshold Amplitude: 1.5 V
Lead Channel Pacing Threshold Pulse Width: 0.4 ms
Lead Channel Setting Pacing Amplitude: 2.5 V
Lead Channel Setting Pacing Amplitude: 2.5 V
Lead Channel Setting Pacing Pulse Width: 1 ms
Lead Channel Setting Sensing Sensitivity: 5.6 mV

## 2021-05-07 NOTE — Progress Notes (Signed)
Remote pacemaker transmission.   

## 2021-07-02 ENCOUNTER — Other Ambulatory Visit: Payer: Self-pay

## 2021-07-02 ENCOUNTER — Ambulatory Visit (INDEPENDENT_AMBULATORY_CARE_PROVIDER_SITE_OTHER): Payer: Medicare HMO | Admitting: Internal Medicine

## 2021-07-02 ENCOUNTER — Encounter: Payer: Self-pay | Admitting: Internal Medicine

## 2021-07-02 VITALS — BP 160/90 | HR 69 | Ht 59.0 in | Wt 267.0 lb

## 2021-07-02 DIAGNOSIS — I495 Sick sinus syndrome: Secondary | ICD-10-CM | POA: Diagnosis not present

## 2021-07-02 DIAGNOSIS — Z950 Presence of cardiac pacemaker: Secondary | ICD-10-CM

## 2021-07-02 NOTE — Patient Instructions (Signed)
Medication Instructions:  Your physician recommends that you continue on your current medications as directed. Please refer to the Current Medication list given to you today.  *If you need a refill on your cardiac medications before your next appointment, please call your pharmacy*   Lab Work: None Ordered  If you have labs (blood work) drawn today and your tests are completely normal, you will receive your results only by: MyChart Message (if you have MyChart) OR A paper copy in the mail If you have any lab test that is abnormal or we need to change your treatment, we will call you to review the results.   Testing/Procedures: None Ordered   Follow-Up: At [facility], you and your health needs are our priority.  As part of our continuing mission to provide you with exceptional heart care, we have created designated Provider Care Teams.  These Care Teams include your primary Cardiologist (physician) and Advanced Practice Providers (APPs -  Physician Assistants and Nurse Practitioners) who all work together to provide you with the care you need, when you need it.  We recommend signing up for the patient portal called "MyChart".  Sign up information is provided on this After Visit Summary.  MyChart is used to connect with patients for Virtual Visits (Telemedicine).  Patients are able to view lab/test results, encounter notes, upcoming appointments, etc.  Non-urgent messages can be sent to your provider as well.   To learn more about what you can do with MyChart, go to [email or web address].    Your next appointment:   1 year(s)  The format for your next appointment:   In Person  Provider:   Steven Klein, MD   Other Instructions   

## 2021-07-02 NOTE — Progress Notes (Signed)
Patient Care Team: Danielle Dess, MD as PCP - General (Internal Medicine)   HPI  Susan Howell is a 65 y.o. female Seen in followup for a pacemaker inserted for sinus node dysfunction originally in 1997 with generator change in 2009; last seen 7/19 . She has a history of hypertension labile with orthostatic hypertension     Recurrent pulmonary embolism and DVT she is tolerating her anticoagulation with Apixoban    She has intercurrently been at [facility]. 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 Cultures were positive for an unusual strep species as well as Actinomyces; following that she had fevers and chills for some time.    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  Note from 4/22 reviewed.  "Suspect her chronic dyspnea. Is more function of underlying restrictive physiology based on PFTs today, likely secondary to morbid obesity kyphosis and possibly diaphragmatic weakness due to her polyneuropathy.  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 Echo 12/13/12: EF 65-70%, G2DD, pulm press . b. Echo 02/03/13: 50-55%, no WMA, lef ventricular diastolic function nl, pulm arteries w/in nl range, mild LA dilation.     Headache(784.0)    History of urinary tract infection    HTN (hypertension)    Lung nodules     Motility disorder, esophageal    Obesity    Obesity    Orthostatic hypotension    Pacemaker 2009   a. Dr. Ladona Ridgel. b. Medtronic, SN# [id number], est longevity 10.5 years   Palpitations    Pulmonary embolism (HCC)    a. 2/2 gastric bypass, 1979. b. 12/12/2012   Sinoatrial node dysfunction (HCC) 2009   Sleep apnea    a. no cpap now uses 02 2L at night   Thyroid nodule     Past Surgical History:  Procedure Laterality Date   ABDOMINAL HYSTERECTOMY     ABDOMINAL SURGERY     muscle deteriorated in abdomen   APPENDECTOMY     CHOLECYSTECTOMY     ECTOPIC PREGNANCY SURGERY     EUS N/A 03/03/2013   Procedure: UPPER ENDOSCOPIC ULTRASOUND (EUS) LINEAR;  Surgeon: Rachael Fee, MD;  [facility]: [facility];  Service: Endoscopy;  Laterality: N/A;   FOOT SURGERY     both feet, and ankles   GASTRIC BYPASS     KNEE SURGERY Bilateral    LAMINECTOMY     OPEN REDUCTION INTERNAL FIXATION (ORIF) METACARPAL Left 03/22/2015   Procedure: OPEN REDUCTION INTERNAL FIXATION (ORIF) LEFT SMALL FINGER;  Surgeon: Knute Neu, MD;  [facility];  Service: Orthopedics;  Laterality:  Left;   ORIF ANKLE FRACTURE Left 03/22/2015   Procedure: OPEN REDUCTION INTERNAL FIXATION (ORIF) BIMAL ANKLE FRACTURE;  Surgeon: Sheral Apley, MD;  Location: [facility] OR;  Service: Orthopedics;  Laterality: Left;   PACEMAKER PLACEMENT     x2, pacemaker leads replaced   RIB RESECTION     SPLENECTOMY, TOTAL     Complicated subdiaphragmatic abscess as a consequent gastric bypass   STOMACH SURGERY     "2/3 of stomach removed"    Current Outpatient Medications  Medication Sig Dispense Refill   Cholecalciferol 25 MCG (1000 UT) tablet Take 1,000 Units by mouth daily.     clonazePAM (KLONOPIN) 0.5 MG tablet Take 0.5 mg by mouth. Takes 1/4 tablet in AM and 3/4 tablet in the evening     cyclobenzaprine (FLEXERIL) 5 MG tablet Take 1 tablet by mouth 2 (two) times a week.     ELIQUIS 2.5 MG TABS tablet TAKE 1 TABLET BY MOUTH TWICE A DAY 180  tablet 3   furosemide (LASIX) 20 MG tablet Take 40 mg by mouth 2 (two) times a week.     metoprolol tartrate (LOPRESSOR) 50 MG tablet Take 50 mg by mouth 2 (two) times daily.     naloxone (NARCAN) nasal spray 4 mg/0.1 mL Place 1 spray into the nose as directed.     nystatin (MYCOSTATIN) 100000 UNIT/ML suspension Take 5 mLs by mouth as directed.     Oxycodone HCl 10 MG TABS Take 10 mg by mouth 4 (four) times daily as needed.     No current facility-administered medications for this visit.    Allergies  Allergen Reactions   Amlodipine Shortness Of Breath   Iodine Other (See Comments)    seizures   Labetalol Hcl Hypertension   Labetalol Hcl Other (See Comments)   Tetanus Toxoid, Adsorbed Swelling    Fever, flushing and hardening of the injection site   Bactrim [Sulfamethoxazole-Trimethoprim] Other (See Comments)    R/T Coumadin   Eszopiclone Other (See Comments)    REACTION: NIGHT MARES   Iohexol Nausea And Vomiting and Other (See Comments)     Desc: NAUSEA,VOMITING & SZ S/P CONTRAST INJ 30 YRS AGO// OK W/ PRE MEDS, BENADRYL & SOLUMEDROL TODAY//A.C., Onset Date: [id number]    Penicillins Nausea And Vomiting    Cannot take tablets by mouth; IV is fine.    Smallpox Vaccine Other (See Comments)    Swelling at injection site, fever   Tetanus Toxoids Swelling and Other (See Comments)    Fever, flushing and hardening of the injection site   Topamax Other (See Comments)    Confusion, high blood pressure.    Vancomycin Nausea Only    Severe chills   Albuterol     Inhaler made patient heart race   Amitriptyline     sedation   Ceftriaxone     Rash   Gabapentin     Nightmares, mood swings, nausea   Latex    Nitrofuran Derivatives     Loss of appetite   Ondansetron     headaches   Tape     Other reaction(s): RASH   Topiramate     Other reaction(s): OTHER   Aliskiren Fumarate Other (See Comments)    Caused SYNCOPE   Amoxicillin Nausea And Vomiting    Cannot take tablets by  mouth; IV is fine.    Codeine Itching    Review of Systems negative except from HPI and PMH  Physical Exam BP (!) 160/90 (BP Location: Left  Arm, Patient Position: Sitting, Cuff Size: Large)   Pulse 69   Ht 4\' 11"  (1.499 m)   Wt 267 lb (121.1 kg)   SpO2 94%   BMI 53.93 kg/m   Well developed and Morbidly obese in no acute distress HENT normal Neck supple Clear Device pocket well healed; without hematoma or erythema.  There is no tethering  Regular rate and rhythm, no  gallop No murmur Abd-soft with active BS No Clubbing cyanosis tr edema Skin-warm and dry A & Oriented  Grossly normal sensory and motor function  ECG sinus @ 69 34/11/42 RBBB      assessment and plan  Sinus node dysfunction  Low blood pressure and orthostatic hypertension  1AVB/RBBB   Pacemaker-Medtronic The patient's device was interrogated.  The information was reviewed. No changes were made in the programming.     Recurrent pulmonary embolism on Apixoban 2.5 bid   Dyspnea on exertion  Morbid obesity  Chronic pain    Patient's device function is normal.  Estimated longevity 3 years.  History of orthostatic hypotension by report.  She was asking about alternatives to beta-blockers.  She has been tried on multiple drugs but was worried about thiazide diuretics.  I think that this would be a reasonable thing to try; I am not impressed by the data for the benefits of beta-blockers for hypertension anyway.  Would prefer chlorthalidone.  Spironolactone might be another alternative.  Will defer decision making however to her PCP in [facility] who is intimately involved in all aspects of her health care

## 2021-07-17 ENCOUNTER — Ambulatory Visit (INDEPENDENT_AMBULATORY_CARE_PROVIDER_SITE_OTHER): Payer: Medicare HMO

## 2021-07-17 DIAGNOSIS — I495 Sick sinus syndrome: Secondary | ICD-10-CM | POA: Diagnosis not present

## 2021-07-19 LAB — CUP PACEART REMOTE DEVICE CHECK
Battery Impedance: 2229 Ohm
Battery Remaining Longevity: 38 mo
Battery Voltage: 2.75 V
Brady Statistic AP VP Percent: 0 %
Brady Statistic AP VS Percent: 4 %
Brady Statistic AS VP Percent: 1 %
Brady Statistic AS VS Percent: 95 %
Date Time Interrogation Session: 20220922140133
Implantable Lead Implant Date: 19970315
Implantable Lead Implant Date: 19970315
Implantable Lead Location: 753859
Implantable Lead Location: 753860
Implantable Lead Model: 4024
Implantable Lead Model: 4524
Implantable Pulse Generator Implant Date: 20090210
Lead Channel Impedance Value: 1012 Ohm
Lead Channel Impedance Value: 742 Ohm
Lead Channel Pacing Threshold Amplitude: 1.5 V
Lead Channel Pacing Threshold Pulse Width: 0.4 ms
Lead Channel Setting Pacing Amplitude: 2.5 V
Lead Channel Setting Pacing Amplitude: 2.5 V
Lead Channel Setting Pacing Pulse Width: 1 ms
Lead Channel Setting Sensing Sensitivity: 5.6 mV

## 2021-07-24 NOTE — Progress Notes (Signed)
Remote pacemaker transmission.   

## 2022-01-15 ENCOUNTER — Ambulatory Visit (INDEPENDENT_AMBULATORY_CARE_PROVIDER_SITE_OTHER): Payer: Medicare HMO

## 2022-01-15 DIAGNOSIS — I495 Sick sinus syndrome: Secondary | ICD-10-CM

## 2022-01-20 LAB — CUP PACEART REMOTE DEVICE CHECK
Battery Impedance: 2326 Ohm
Battery Remaining Longevity: 36 mo
Battery Voltage: 2.74 V
Brady Statistic AP VP Percent: 0 %
Brady Statistic AP VS Percent: 1 %
Brady Statistic AS VP Percent: 1 %
Brady Statistic AS VS Percent: 98 %
Date Time Interrogation Session: 20230324160939
Implantable Lead Implant Date: 19970315
Implantable Lead Implant Date: 19970315
Implantable Lead Location: 753859
Implantable Lead Location: 753860
Implantable Lead Model: 4024
Implantable Lead Model: 4524
Implantable Pulse Generator Implant Date: 20090210
Lead Channel Impedance Value: 683 Ohm
Lead Channel Impedance Value: 871 Ohm
Lead Channel Pacing Threshold Amplitude: 1.5 V
Lead Channel Pacing Threshold Pulse Width: 0.4 ms
Lead Channel Setting Pacing Amplitude: 2.5 V
Lead Channel Setting Pacing Amplitude: 2.5 V
Lead Channel Setting Pacing Pulse Width: 1 ms
Lead Channel Setting Sensing Sensitivity: 4 mV

## 2022-01-28 NOTE — Progress Notes (Signed)
Remote pacemaker transmission.   

## 2022-04-16 ENCOUNTER — Ambulatory Visit (INDEPENDENT_AMBULATORY_CARE_PROVIDER_SITE_OTHER): Payer: Medicare HMO

## 2022-04-16 DIAGNOSIS — I495 Sick sinus syndrome: Secondary | ICD-10-CM

## 2022-04-18 LAB — CUP PACEART REMOTE DEVICE CHECK
Battery Impedance: 2549 Ohm
Battery Remaining Longevity: 33 mo
Battery Voltage: 2.74 V
Brady Statistic AP VP Percent: 0 %
Brady Statistic AP VS Percent: 1 %
Brady Statistic AS VP Percent: 1 %
Brady Statistic AS VS Percent: 99 %
Date Time Interrogation Session: 20230622150424
Implantable Lead Implant Date: 19970315
Implantable Lead Implant Date: 19970315
Implantable Lead Location: 753859
Implantable Lead Location: 753860
Implantable Lead Model: 4024
Implantable Lead Model: 4524
Implantable Pulse Generator Implant Date: 20090210
Lead Channel Impedance Value: 1120 Ohm
Lead Channel Impedance Value: 798 Ohm
Lead Channel Pacing Threshold Amplitude: 1.375 V
Lead Channel Pacing Threshold Pulse Width: 0.4 ms
Lead Channel Setting Pacing Amplitude: 2.5 V
Lead Channel Setting Pacing Amplitude: 2.5 V
Lead Channel Setting Pacing Pulse Width: 1 ms
Lead Channel Setting Sensing Sensitivity: 5.6 mV

## 2022-04-28 NOTE — Progress Notes (Signed)
Remote pacemaker transmission.   

## 2022-11-27 DEATH — deceased

## 2023-02-19 ENCOUNTER — Encounter: Payer: Self-pay | Admitting: Internal Medicine

## 2023-02-19 NOTE — Progress Notes (Signed)
Unable to reach patient to schedule 12 month follow up. Letter sent

## 2023-02-26 ENCOUNTER — Telehealth: Payer: Self-pay | Admitting: Internal Medicine

## 2023-02-26 NOTE — Telephone Encounter (Signed)
Susan Howell called to let us know patient passed away on 2022-11-26, he wants to know what he needs to do with the transmission box.  Please advise.

## 2023-02-27 NOTE — Telephone Encounter (Signed)
I spoke with the patient husband and let him know that I was sending a return kit for the monitor. He should receive it in 7-10 business.
